# Patient Record
Sex: Female | Born: 1935 | Race: White | Hispanic: No | Marital: Single | Smoking: Never smoker
Health system: Southern US, Community
[De-identification: ages and names within clinical notes are randomized; demographics above are authoritative.]

## PROBLEM LIST (undated history)

## (undated) DIAGNOSIS — I10 Essential (primary) hypertension: Secondary | ICD-10-CM

## (undated) DIAGNOSIS — I5020 Unspecified systolic (congestive) heart failure: Secondary | ICD-10-CM

## (undated) DIAGNOSIS — M069 Rheumatoid arthritis, unspecified: Secondary | ICD-10-CM

## (undated) DIAGNOSIS — I2510 Atherosclerotic heart disease of native coronary artery without angina pectoris: Secondary | ICD-10-CM

## (undated) HISTORY — PX: TONSILLECTOMY: SUR1361

---

## 2019-07-16 ENCOUNTER — Emergency Department: Payer: Medicare Other

## 2019-07-16 ENCOUNTER — Other Ambulatory Visit: Payer: Self-pay

## 2019-07-16 ENCOUNTER — Inpatient Hospital Stay (HOSPITAL_COMMUNITY)
Admit: 2019-07-16 | Discharge: 2019-07-16 | Disposition: A | Discharge status: Home / Self Care | Payer: Medicare Other | Attending: Family Medicine | Admitting: Family Medicine

## 2019-07-16 ENCOUNTER — Inpatient Hospital Stay
Admission: EM | Admit: 2019-07-16 | Discharge: 2019-07-17 | DRG: 280 | Disposition: A | Discharge status: Other Acute Inpatient Hospital | Payer: Medicare Other | Attending: Internal Medicine | Admitting: Internal Medicine

## 2019-07-16 ENCOUNTER — Encounter: Payer: Self-pay | Admitting: Emergency Medicine

## 2019-07-16 DIAGNOSIS — I110 Hypertensive heart disease with heart failure: Secondary | ICD-10-CM | POA: Diagnosis present

## 2019-07-16 DIAGNOSIS — I161 Hypertensive emergency: Secondary | ICD-10-CM | POA: Diagnosis present

## 2019-07-16 DIAGNOSIS — J9600 Acute respiratory failure, unspecified whether with hypoxia or hypercapnia: Secondary | ICD-10-CM | POA: Diagnosis present

## 2019-07-16 DIAGNOSIS — I509 Heart failure, unspecified: Secondary | ICD-10-CM

## 2019-07-16 DIAGNOSIS — I361 Nonrheumatic tricuspid (valve) insufficiency: Secondary | ICD-10-CM | POA: Diagnosis not present

## 2019-07-16 DIAGNOSIS — D72829 Elevated white blood cell count, unspecified: Secondary | ICD-10-CM | POA: Diagnosis present

## 2019-07-16 DIAGNOSIS — Z8249 Family history of ischemic heart disease and other diseases of the circulatory system: Secondary | ICD-10-CM | POA: Diagnosis not present

## 2019-07-16 DIAGNOSIS — I2510 Atherosclerotic heart disease of native coronary artery without angina pectoris: Secondary | ICD-10-CM | POA: Diagnosis present

## 2019-07-16 DIAGNOSIS — Z20828 Contact with and (suspected) exposure to other viral communicable diseases: Secondary | ICD-10-CM | POA: Diagnosis present

## 2019-07-16 DIAGNOSIS — I493 Ventricular premature depolarization: Secondary | ICD-10-CM | POA: Diagnosis present

## 2019-07-16 DIAGNOSIS — I5043 Acute on chronic combined systolic (congestive) and diastolic (congestive) heart failure: Secondary | ICD-10-CM | POA: Diagnosis present

## 2019-07-16 DIAGNOSIS — J9601 Acute respiratory failure with hypoxia: Secondary | ICD-10-CM | POA: Diagnosis present

## 2019-07-16 DIAGNOSIS — I340 Nonrheumatic mitral (valve) insufficiency: Secondary | ICD-10-CM

## 2019-07-16 DIAGNOSIS — M1990 Unspecified osteoarthritis, unspecified site: Secondary | ICD-10-CM | POA: Diagnosis present

## 2019-07-16 DIAGNOSIS — Z833 Family history of diabetes mellitus: Secondary | ICD-10-CM | POA: Diagnosis not present

## 2019-07-16 DIAGNOSIS — D649 Anemia, unspecified: Secondary | ICD-10-CM | POA: Diagnosis present

## 2019-07-16 DIAGNOSIS — E1165 Type 2 diabetes mellitus with hyperglycemia: Secondary | ICD-10-CM | POA: Diagnosis present

## 2019-07-16 DIAGNOSIS — R0603 Acute respiratory distress: Secondary | ICD-10-CM | POA: Diagnosis present

## 2019-07-16 DIAGNOSIS — I214 Non-ST elevation (NSTEMI) myocardial infarction: Secondary | ICD-10-CM

## 2019-07-16 DIAGNOSIS — R778 Other specified abnormalities of plasma proteins: Secondary | ICD-10-CM

## 2019-07-16 DIAGNOSIS — M069 Rheumatoid arthritis, unspecified: Secondary | ICD-10-CM | POA: Diagnosis present

## 2019-07-16 DIAGNOSIS — I5021 Acute systolic (congestive) heart failure: Secondary | ICD-10-CM | POA: Diagnosis not present

## 2019-07-16 HISTORY — DX: Rheumatoid arthritis, unspecified: M06.9

## 2019-07-16 LAB — CBC WITH DIFFERENTIAL/PLATELET
Abs Immature Granulocytes: 0.07 10*3/uL (ref 0.00–0.07)
Abs Immature Granulocytes: 0.06 10*3/uL (ref 0.00–0.07)
Basophils Absolute: 0.1 10*3/uL (ref 0.0–0.1)
Basophils Absolute: 0 10*3/uL (ref 0.0–0.1)
Basophils Relative: 1 %
Basophils Relative: 0 %
Eosinophils Absolute: 0.1 10*3/uL (ref 0.0–0.5)
Eosinophils Absolute: 0 10*3/uL (ref 0.0–0.5)
Eosinophils Relative: 1 %
Eosinophils Relative: 0 %
HCT: 39.5 % (ref 36.0–46.0)
HCT: 37.1 % (ref 36.0–46.0)
Hemoglobin: 11.7 g/dL — ABNORMAL LOW (ref 12.0–15.0)
Hemoglobin: 11.4 g/dL — ABNORMAL LOW (ref 12.0–15.0)
Immature Granulocytes: 1 %
Immature Granulocytes: 1 %
Lymphocytes Relative: 32 %
Lymphocytes Relative: 4 %
Lymphs Abs: 4.9 10*3/uL — ABNORMAL HIGH (ref 0.7–4.0)
Lymphs Abs: 0.5 10*3/uL — ABNORMAL LOW (ref 0.7–4.0)
MCH: 23.4 pg — ABNORMAL LOW (ref 26.0–34.0)
MCH: 23.1 pg — ABNORMAL LOW (ref 26.0–34.0)
MCHC: 29.6 g/dL — ABNORMAL LOW (ref 30.0–36.0)
MCHC: 30.7 g/dL (ref 30.0–36.0)
MCV: 79.2 fL — ABNORMAL LOW (ref 80.0–100.0)
MCV: 75.3 fL — ABNORMAL LOW (ref 80.0–100.0)
Monocytes Absolute: 0.9 10*3/uL (ref 0.1–1.0)
Monocytes Absolute: 0.3 10*3/uL (ref 0.1–1.0)
Monocytes Relative: 6 %
Monocytes Relative: 3 %
Neutro Abs: 9.1 10*3/uL — ABNORMAL HIGH (ref 1.7–7.7)
Neutro Abs: 11.1 10*3/uL — ABNORMAL HIGH (ref 1.7–7.7)
Neutrophils Relative %: 59 %
Neutrophils Relative %: 92 %
Platelets: 296 10*3/uL (ref 150–400)
Platelets: 279 10*3/uL (ref 150–400)
RBC: 4.99 MIL/uL (ref 3.87–5.11)
RBC: 4.93 MIL/uL (ref 3.87–5.11)
RDW: 16 % — ABNORMAL HIGH (ref 11.5–15.5)
RDW: 15.8 % — ABNORMAL HIGH (ref 11.5–15.5)
WBC: 15.2 10*3/uL — ABNORMAL HIGH (ref 4.0–10.5)
WBC: 12 10*3/uL — ABNORMAL HIGH (ref 4.0–10.5)
nRBC: 0 % (ref 0.0–0.2)
nRBC: 0 % (ref 0.0–0.2)

## 2019-07-16 LAB — GLUCOSE, CAPILLARY
Glucose-Capillary: 109 mg/dL — ABNORMAL HIGH (ref 70–99)
Glucose-Capillary: 139 mg/dL — ABNORMAL HIGH (ref 70–99)
Glucose-Capillary: 131 mg/dL — ABNORMAL HIGH (ref 70–99)
Glucose-Capillary: 126 mg/dL — ABNORMAL HIGH (ref 70–99)
Glucose-Capillary: 99 mg/dL (ref 70–99)

## 2019-07-16 LAB — COMPREHENSIVE METABOLIC PANEL
ALT: 24 U/L (ref 0–44)
AST: 34 U/L (ref 15–41)
Albumin: 3.3 g/dL — ABNORMAL LOW (ref 3.5–5.0)
Alkaline Phosphatase: 95 U/L (ref 38–126)
Anion gap: 13 (ref 5–15)
BUN: 16 mg/dL (ref 8–23)
CO2: 19 mmol/L — ABNORMAL LOW (ref 22–32)
Calcium: 9.6 mg/dL (ref 8.9–10.3)
Chloride: 104 mmol/L (ref 98–111)
Creatinine, Ser: 0.86 mg/dL (ref 0.44–1.00)
GFR calc Af Amer: >60 mL/min (ref >60)
GFR calc non Af Amer: >60 mL/min (ref >60)
Glucose, Bld: 314 mg/dL — ABNORMAL HIGH (ref 70–99)
Potassium: 4.1 mmol/L (ref 3.5–5.1)
Sodium: 136 mmol/L (ref 135–145)
Total Bilirubin: 0.8 mg/dL (ref 0.3–1.2)
Total Protein: 6.4 g/dL — ABNORMAL LOW (ref 6.5–8.1)

## 2019-07-16 LAB — SARS CORONAVIRUS 2 BY RT PCR (HOSPITAL ORDER, PERFORMED IN ~~LOC~~ HOSPITAL LAB): SARS Coronavirus 2: NEGATIVE

## 2019-07-16 LAB — URINALYSIS, ROUTINE W REFLEX MICROSCOPIC
Bilirubin Urine: NEGATIVE
Glucose, UA: 50 mg/dL — AB
Hgb urine dipstick: NEGATIVE
Ketones, ur: 5 mg/dL — AB
Leukocytes,Ua: NEGATIVE
Nitrite: NEGATIVE
Protein, ur: 100 mg/dL — AB
Specific Gravity, Urine: 1.018 (ref 1.005–1.030)
pH: 5 (ref 5.0–8.0)

## 2019-07-16 LAB — TROPONIN I (HIGH SENSITIVITY)
Troponin I (High Sensitivity): 92 ng/L — ABNORMAL HIGH (ref <18)
Troponin I (High Sensitivity): 193 ng/L (ref <18)
Troponin I (High Sensitivity): 599 ng/L (ref <18)

## 2019-07-16 LAB — APTT: aPTT: <24 seconds — ABNORMAL LOW (ref 24–36)

## 2019-07-16 LAB — ECHOCARDIOGRAM COMPLETE
Height: 65 in
Weight: 2491.2 oz

## 2019-07-16 LAB — MAGNESIUM: Magnesium: 2.4 mg/dL (ref 1.7–2.4)

## 2019-07-16 LAB — HEPARIN LEVEL (UNFRACTIONATED)
Heparin Unfractionated: 0.17 IU/mL — ABNORMAL LOW (ref 0.30–0.70)
Heparin Unfractionated: 0.32 IU/mL (ref 0.30–0.70)

## 2019-07-16 LAB — PROTIME-INR
INR: 1.2 (ref 0.8–1.2)
Prothrombin Time: 15.2 seconds (ref 11.4–15.2)

## 2019-07-16 LAB — MRSA PCR SCREENING: MRSA by PCR: NEGATIVE

## 2019-07-16 LAB — BRAIN NATRIURETIC PEPTIDE: B Natriuretic Peptide: 2027 pg/mL — ABNORMAL HIGH (ref 0.0–100.0)

## 2019-07-16 LAB — PROCALCITONIN: Procalcitonin: <0.1 ng/mL

## 2019-07-16 MED ORDER — SODIUM CHLORIDE 0.9% FLUSH: 3.0000 mL | INTRAVENOUS | Status: DC | PRN

## 2019-07-16 MED ORDER — NITROGLYCERIN 0.4 MG SL SUBL: 0.4000 mg | SUBLINGUAL_TABLET | SUBLINGUAL | Status: DC | PRN

## 2019-07-16 MED ORDER — NITROGLYCERIN 2 % TD OINT
1.0000 [in_us] | TOPICAL_OINTMENT | Freq: Four times a day (QID) | TRANSDERMAL | Status: DC
  Administered 2019-07-16 – 2019-07-17 (×4): 1 [in_us] via TOPICAL
  Filled 2019-07-16 (×4): qty 1

## 2019-07-16 MED ORDER — CHLORHEXIDINE GLUCONATE CLOTH 2 % EX PADS: 6.0000 | MEDICATED_PAD | Freq: Every day | CUTANEOUS | Status: DC

## 2019-07-16 MED ORDER — INSULIN REGULAR HUMAN 100 UNIT/ML IJ SOLN
10.0000 [IU] | Freq: Once | INTRAMUSCULAR | Status: AC
  Administered 2019-07-16: 10 [IU] via INTRAVENOUS
  Filled 2019-07-16: qty 10

## 2019-07-16 MED ORDER — HEPARIN BOLUS VIA INFUSION
2000.0000 [IU] | Freq: Once | INTRAVENOUS | Status: AC
  Administered 2019-07-16: 2000 [IU] via INTRAVENOUS
  Filled 2019-07-16: qty 2000

## 2019-07-16 MED ORDER — FAMOTIDINE 20 MG PO TABS: 20.0000 mg | ORAL_TABLET | Freq: Every day | ORAL | Status: DC

## 2019-07-16 MED ORDER — FUROSEMIDE 10 MG/ML IJ SOLN
20.0000 mg | Freq: Once | INTRAMUSCULAR | Status: AC
  Administered 2019-07-16: 20 mg via INTRAVENOUS
  Filled 2019-07-16: qty 4

## 2019-07-16 MED ORDER — HEPARIN SODIUM (PORCINE) 5000 UNIT/ML IJ SOLN: 60.0000 [IU]/kg | Freq: Once | INTRAMUSCULAR | Status: DC

## 2019-07-16 MED ORDER — ZOLPIDEM TARTRATE 5 MG PO TABS
5.0000 mg | ORAL_TABLET | Freq: Every evening | ORAL | Status: DC | PRN
  Filled 2019-07-16: qty 1

## 2019-07-16 MED ORDER — ASPIRIN EC 325 MG PO TBEC
325.0000 mg | DELAYED_RELEASE_TABLET | Freq: Every day | ORAL | Status: DC
  Administered 2019-07-16: 09:00:00 325 mg via ORAL
  Filled 2019-07-16: qty 1

## 2019-07-16 MED ORDER — ORAL CARE MOUTH RINSE
15.0000 mL | Freq: Two times a day (BID) | OROMUCOSAL | Status: DC
  Administered 2019-07-16: 23:00:00 15 mL via OROMUCOSAL

## 2019-07-16 MED ORDER — ATORVASTATIN CALCIUM 20 MG PO TABS
40.0000 mg | ORAL_TABLET | Freq: Every day | ORAL | Status: DC
  Administered 2019-07-16: 17:00:00 40 mg via ORAL
  Filled 2019-07-16: qty 2

## 2019-07-16 MED ORDER — HEPARIN BOLUS VIA INFUSION
4000.0000 [IU] | Freq: Once | INTRAVENOUS | Status: AC
  Administered 2019-07-16: 4000 [IU] via INTRAVENOUS
  Filled 2019-07-16: qty 4000

## 2019-07-16 MED ORDER — ASPIRIN 81 MG PO CHEW
324.0000 mg | CHEWABLE_TABLET | Freq: Once | ORAL | Status: AC
  Administered 2019-07-16: 324 mg via ORAL
  Filled 2019-07-16: qty 4

## 2019-07-16 MED ORDER — ASPIRIN EC 81 MG PO TBEC: 81.0000 mg | DELAYED_RELEASE_TABLET | Freq: Every day | ORAL | Status: DC

## 2019-07-16 MED ORDER — HYDRALAZINE HCL 20 MG/ML IJ SOLN: 10.0000 mg | Freq: Four times a day (QID) | INTRAMUSCULAR | Status: DC | PRN

## 2019-07-16 MED ORDER — HEPARIN (PORCINE) 25000 UT/250ML-% IV SOLN: 10.0000 [IU]/kg/h | INTRAVENOUS | Status: DC

## 2019-07-16 MED ORDER — LISINOPRIL 5 MG PO TABS
5.0000 mg | ORAL_TABLET | Freq: Every day | ORAL | Status: DC
  Administered 2019-07-16: 5 mg via ORAL
  Filled 2019-07-16: qty 1

## 2019-07-16 MED ORDER — MORPHINE SULFATE (PF) 2 MG/ML IV SOLN: 2.0000 mg | INTRAVENOUS | Status: DC | PRN

## 2019-07-16 MED ORDER — LABETALOL HCL 5 MG/ML IV SOLN: 20.0000 mg | INTRAVENOUS | Status: DC | PRN

## 2019-07-16 MED ORDER — FAMOTIDINE 20 MG PO TABS
20.0000 mg | ORAL_TABLET | Freq: Two times a day (BID) | ORAL | Status: DC
  Administered 2019-07-16: 20 mg via ORAL
  Filled 2019-07-16: qty 1

## 2019-07-16 MED ORDER — SODIUM CHLORIDE 0.9 % IV SOLN
INTRAVENOUS | Status: DC
  Administered 2019-07-17: 08:00:00 via INTRAVENOUS

## 2019-07-16 MED ORDER — SODIUM CHLORIDE 0.9 % IV SOLN: 250.0000 mL | INTRAVENOUS | Status: DC | PRN

## 2019-07-16 MED ORDER — ACETAMINOPHEN 325 MG PO TABS: 650.0000 mg | ORAL_TABLET | ORAL | Status: DC | PRN

## 2019-07-16 MED ORDER — ONDANSETRON HCL 4 MG/2ML IJ SOLN: 4.0000 mg | Freq: Four times a day (QID) | INTRAMUSCULAR | Status: DC | PRN

## 2019-07-16 MED ORDER — FUROSEMIDE 10 MG/ML IJ SOLN
20.0000 mg | Freq: Two times a day (BID) | INTRAMUSCULAR | Status: DC
  Administered 2019-07-16 – 2019-07-17 (×3): 20 mg via INTRAVENOUS
  Filled 2019-07-16 (×3): qty 2

## 2019-07-16 MED ORDER — INSULIN ASPART 100 UNIT/ML ~~LOC~~ SOLN
0.0000 [IU] | Freq: Three times a day (TID) | SUBCUTANEOUS | Status: DC
  Administered 2019-07-16 (×3): 3 [IU] via SUBCUTANEOUS
  Filled 2019-07-16 (×3): qty 1

## 2019-07-16 MED ORDER — HEPARIN (PORCINE) 25000 UT/250ML-% IV SOLN
1100.0000 [IU]/h | INTRAVENOUS | Status: DC
  Administered 2019-07-16: 04:00:00 800 [IU]/h via INTRAVENOUS
  Administered 2019-07-16: 950 [IU]/h via INTRAVENOUS
  Filled 2019-07-16 (×2): qty 250

## 2019-07-16 NOTE — ED Notes (Signed)
Dr Karma Greaser in to speak with patient and family

## 2019-07-16 NOTE — H&P (Addendum)
[facility] [place] NAME: Lacey Coleman    MR#:  [id number]  DATE OF BIRTH:  January 28, 1931  DATE OF ADMISSION:  07/16/2019  PRIMARY CARE PHYSICIAN: Patient, No Pcp Per   REQUESTING/REFERRING PHYSICIAN: Hinda Kehr, MD CHIEF COMPLAINT:   Chief Complaint  Patient presents with  . Respiratory Distress    HISTORY OF PRESENT ILLNESS:  Lacey Coleman  is a 85 y.o. very pleasant Caucasian female with a known history of rheumatoid arthritis who presented to the emergency room with acute onset of dyspnea which has been going on over the last 3 months and significantly worsened tonight.  She denied any cough or wheezing however has been having orthopnea and paroxysmal nocturnal dyspnea as well as worsening lower extremity edema.  She admits to midsternal and left parasternal chest pain felt as pressure and tightness and graded 10/10 in severity with no radiation.  She denied any palpitations.  No dysuria, oliguria or hematuria or flank pain.  No bleeding diathesis.  The patient has not seen a physician in 50 years.  Upon presentation to the emergency room, blood pressure was 165/91 with a pulse of 131 respiratory to 25 and pulse oximetry of 70% on room air.  Labs revealed a CO2 of 19 and glucose of 314, anion gap of 13 and magnesium of 2.4.  Her BNP was significantly elevated at 2027 and troponin I was elevated at 92.  CBC showed leukocytosis and mild anemia.  COVID-19 test came back negative.  Her INR is 1.2 and UA was unremarkable.  Portable chest ray showed small bilateral pleural effusions with bibasal atelectasis.  EKG showed sinus tachycardia with a rate of 133 initially before she was placed on BiPAP and later is come down to 110.  She was given 4 of aspirin as well as IV heparin bolus and drip.  She will be admitted to stepdown unit for further evaluation and management.  PAST MEDICAL HISTORY:   Past Medical History:  Diagnosis Date  . Rheumatoid  arthritis ([place])   Osteoarthritis  PAST SURGICAL HISTORY:  Tonsillectomy  SOCIAL HISTORY:   Social History   Tobacco Use  . Smoking status: Never Smoker  . Smokeless tobacco: Never Used  Substance Use Topics  . Alcohol use: Not Currently    Frequency: Never    FAMILY HISTORY:  Positive for MI in both her parents and diabetes mellitus in her aunt.  DRUG ALLERGIES:  No Known Allergies  REVIEW OF SYSTEMS:   ROS As per history of present illness. All pertinent systems were reviewed above. Constitutional,  HEENT, cardiovascular, respiratory, GI, GU, musculoskeletal, neuro, psychiatric, endocrine,  integumentary and hematologic systems were reviewed and are otherwise  negative/unremarkable except for positive findings mentioned above in the HPI.   MEDICATIONS AT HOME:   Prior to Admission medications   Not on File      VITAL SIGNS:  Blood pressure 136/76, pulse (!) 116, temperature 98 F (36.7 C), temperature source Rectal, resp. rate (!) 29, height 5\' 5"  (1.651 m), weight 70.6 kg, SpO2 99 %.  PHYSICAL EXAMINATION:  Physical Exam  GENERAL:  85 y.o.-year-old pleasant Caucasian female patient lying in the bed with mild respiratory distress on BiPAP. EYES: Pupils equal, round, reactive to light and accommodation. No scleral icterus. Extraocular muscles intact.  HEENT: Head atraumatic, normocephalic. Oropharynx clear. NECK:  Supple, no jugular venous distention. No thyroid enlargement, no tenderness.  LUNGS: Diminished bibasilar breath sounds with bibasal rales. CARDIOVASCULAR: Regular  rate and rhythm, S1, S2 normal. No murmurs, rubs, or gallops.  ABDOMEN: Soft, nondistended, nontender. Bowel sounds present. No organomegaly or mass.  EXTREMITIES: 2-3+ bilateral lower extremity pitting edema with no clubbing or cyanosis.  NEUROLOGIC: Cranial nerves II through XII are intact. Muscle strength 5/5 in all extremities. Sensation intact. Gait not checked.  PSYCHIATRIC: The  patient is alert and oriented x 3.  Normal affect and good eye contact. SKIN: No obvious rash, lesion, or ulcer.   LABORATORY PANEL:   CBC Recent Labs  Lab 07/16/19 0141  WBC 15.2*  HGB 11.7*  HCT 39.5  PLT 296   ------------------------------------------------------------------------------------------------------------------  Chemistries  Recent Labs  Lab 07/16/19 0141  NA 136  K 4.1  CL 104  CO2 19*  GLUCOSE 314*  BUN 16  CREATININE 0.86  CALCIUM 9.6  MG 2.4  AST 34  ALT 24  ALKPHOS 95  BILITOT 0.8   ------------------------------------------------------------------------------------------------------------------  Cardiac Enzymes No results for input(s): TROPONINI in the last 168 hours. ------------------------------------------------------------------------------------------------------------------  RADIOLOGY:  Dg Chest Portable 1 View  Result Date: 07/16/2019 CLINICAL DATA:  Shortness of breath EXAM: PORTABLE CHEST 1 VIEW COMPARISON:  None. FINDINGS: Small pleural effusions with bibasilar atelectasis. No other consolidation. No pulmonary edema or pneumothorax. Cardiomediastinal contours are normal. IMPRESSION: Small bilateral pleural effusions with bibasilar atelectasis. Electronically Signed   By: Deatra RobinsonKevin  Herman M.D.   On: 07/16/2019 02:13      IMPRESSION AND PLAN:   1.  Acute new onset CHF likely diastolic in nature with subsequent acute respiratory failure. -The patient will be admitted to a stepdown bed and will be diuresed with IV Lasix. - Will follow serial cardiac enzymes.   -Will obtain a 2D echo and a cardiology consultation in a.m. -We will continue her on BiPAP and taper off as tolerated to nasal cannula. -We will follow her ABG. - I notified Dr. Johney FrameAllred with [facility] about the consult. - I also discussed the case with the e- link intensivist on duty.  2.  Suspected acute coronary syndrome, possibly non-ST elevation MI.  She has chest pain with  elevated troponin I of 92.  The troponin elevation could be related to her acute CHF though. -Will follow more troponin levels. -Will obtain a 2D echo and a cardiology consult as mentioned above. -She will be placed on aspirin as well as Nitropaste. -Statin therapy will be added and fasting lipids will be checked. -We will hold off on starting beta-blocker until she is more euvolemic. -We will continue IV heparin drip. -She will be placed on PRN sublingual nitroglycerin and morphine sulfate for pain as well as scheduled Nitropaste.  3.  Hypertensive urgency.  This is likely the culprit for her acute CHF. - She will be placed on PRN IV labetalol and hydralazine. - We will start her on p.o. lisinopril. -She will be on scheduled Nitropaste  4.  New onset uncontrolled diabetes mellitus likely type II. -She will be placed on supplemental coverage with NovoLog.  We will give 10 units of IV regular insulin. -Will obtain hemoglobin A1c level.  5.  Osteoarthritis.  She has no current arthralgia.  6.  DVT prophylaxis.  She will be on IV heparin drip.  GI prophylaxis will be provided with p.o. Pepcid given current anticoagulation.  All the records are reviewed and case discussed with ED provider. The plan of care was discussed in details with the patient (and family). I answered all questions. The patient agreed to proceed with the above mentioned  plan. Further management will depend upon hospital course.   CODE STATUS: Full code  TOTAL CRITICAL CARE TIME TAKING CARE OF THIS PATIENT: 60 minutes.    Hannah Beat M.D on 07/16/2019 at 3:22 AM  Pager - [phone]  After 6pm go to [email or web address] - Social research officer, government  [facility] [place] [facility]  Office  [phone]  CC: Primary care physician; Patient, No Pcp Per   Note: This dictation was prepared with Dragon dictation along with smaller phrase technology. Any transcriptional errors that result from this process are  unintentional.

## 2019-07-16 NOTE — Consult Note (Signed)
CARDIOLOGY CONSULT NOTE     Primary Care Physician: Patient, No Pcp Per Referring Physician:  Dr Luberta Mutter  Admit Date: 07/16/2019  Reason for consultation:  New CHF  Lacey Coleman is a 85 y.o. female with a h/o RA who is admitted with worsening SOB x 3 weeks.  The patient has noticed progressive orthopnea and PND.   + Edema.   She also has occasional chest pain.  She required BiPAP in the ED and is admitted for further management.  Echo has revealed markedly depressed EF. With aggressive diuresis overnight, she feels "much better" today.  Today, she denies symptoms of palpitations, chest pain,   dizziness, presyncope, syncope, or neurologic sequela. The patient is tolerating medications without difficulties and is otherwise without complaint today.   Past Medical History:  Diagnosis Date  . Rheumatoid arthritis ([facility])    History reviewed. No pertinent surgical history.  Marland Kitchen aspirin EC  325 mg Oral Daily  . atorvastatin  40 mg Oral q1800  . Chlorhexidine Gluconate Cloth  6 each Topical Daily  . famotidine  20 mg Oral BID  . furosemide  20 mg Intravenous Q12H  . heparin  2,000 Units Intravenous Once  . insulin aspart  0-20 Units Subcutaneous TID WC  . lisinopril  5 mg Oral Daily  . mouth rinse  15 mL Mouth Rinse BID  . nitroGLYCERIN  1 inch Topical Q6H   . sodium chloride    . heparin 800 Units/hr (07/16/19 1000)    No Known Allergies  Social History   Socioeconomic History  . Marital status: Single    Spouse name: Not on file  . Number of children: Not on file  . Years of education: Not on file  . Highest education level: Not on file  Occupational History  . Not on file  Social Needs  . Financial resource strain: Not on file  . Food insecurity    Worry: Not on file    Inability: Not on file  . Transportation needs    Medical: Not on file    Non-medical: Not on file  Tobacco Use  . Smoking status: Never Smoker  . Smokeless tobacco: Never Used  Substance and  Sexual Activity  . Alcohol use: Not Currently    Frequency: Never  . Drug use: Not Currently  . Sexual activity: Not on file  Lifestyle  . Physical activity    Days per week: Not on file    Minutes per session: Not on file  . Stress: Not on file  Relationships  . Social Musician on phone: Not on file    Gets together: Not on file    Attends religious service: Not on file    Active member of club or organization: Not on file    Attends meetings of clubs or organizations: Not on file    Relationship status: Not on file  . Intimate partner violence    Fear of current or ex partner: Not on file    Emotionally abused: Not on file    Physically abused: Not on file    Forced sexual activity: Not on file  Other Topics Concern  . Not on file  Social History Narrative  . Not on file    History reviewed. No pertinent family history.  ROS- All systems are reviewed and negative except as per the HPI above  Physical Exam: Telemetry: sinus Vitals:   07/16/19 0900 07/16/19 1000 07/16/19 1100 07/16/19 1200  BP: Marland Kitchen)  142/84 119/74 132/79 113/65  Pulse: (!) 113 98 (!) 107 (!) 107  Resp: (!) 21 (!) 23 20 (!) 26  Temp:    98.5 F (36.9 C)  TempSrc:    Oral  SpO2: 98% 97% 96% 96%  Weight:      Height:        GEN- The patient is elderly and frail appearing, alert and oriented x 3 today.   Head- normocephalic, atraumatic Eyes-  Sclera clear, conjunctiva pink Ears- hearing intact Oropharynx- clear Neck- supple, + JVD Lungs- bibasilar rales , normal work of breathing Heart- Regular rate and rhythm  GI- soft, NT, ND, + BS Extremities- no clubbing, cyanosis, + edema MS- diffuse atrophy Skin- no rash or lesion Psych- euthymic mood, full affect Neuro- strength and sensation are intact  EKG: pending  Labs:   Lab Results  Component Value Date   WBC 12.0 (H) 07/16/2019   HGB 11.4 (L) 07/16/2019   HCT 37.1 07/16/2019   MCV 75.3 (L) 07/16/2019   PLT 279 07/16/2019     Recent Labs  Lab 07/16/19 0141  NA 136  K 4.1  CL 104  CO2 19*  BUN 16  CREATININE 0.86  CALCIUM 9.6  PROT 6.4*  BILITOT 0.8  ALKPHOS 95  ALT 24  AST 34  GLUCOSE 314*    Radiology: CXR reveals small bilateral effusions  Echo:  EF 28-41%, diastolic dysfunction, mild AI  ASSESSMENT AND PLAN:   1. Acute systolic dysfunction Unclear etiology Clinically improved with diuresis Continue IV lasix another 24-48 hours Add beta blocker as her CHF improves I would advise left heart cath to further evaluate the cause of her CHF.  Risks of cath discussed with patient who wishes to proceed.  2. Hypertensive cardiovascular disease with CHF Aggressive medical management is advised Continue current therapy Add coreg tomorrow and continue to titrate medicines as BP allows   Thompson Grayer, MD 07/16/2019  1:20 PM

## 2019-07-16 NOTE — ED Notes (Signed)
.. ED TO INPATIENT HANDOFF REPORT  ED Nurse Name and Phone #: Jacki ConesLaurie [phone]  S Name/Age/Gender Lacey Coleman 85 y.o. female Room/Bed: ED06A/ED06A  Code Status   Code Status: Full Code  Home/SNF/Other Home Patient oriented to: self, place, time and situation Is this baseline? Yes   Triage Complete: Triage complete  Chief Complaint breathing Diff  Triage Note Ptpresents via EMS in respiratory distress. EMS reported sudden onset, PMH negligible. Pt pale, tachycardiac, 3+ bilateral pitting edema in legs. Pt w/ 3 hr hx of respiratory distress prior to EMS transport.   Allergies No Known Allergies  Level of Care/Admitting Diagnosis ED Disposition    ED Disposition Condition Comment   Admit  Hospital Area: [facility] [[id number]]  Level of Care: Stepdown [14]  Covid Evaluation: Confirmed COVID Negative  Diagnosis: Acute respiratory failure (HCC) [518.81.ICD-9-CM]  Admitting Physician: Hannah BeatMANSY, JAN A [[id number]]  Attending Physician: Hannah BeatMANSY, JAN A [[id number]]  Estimated length of stay: past midnight tomorrow  Certification:: I certify this patient will need inpatient services for at least 2 midnights  PT Class (Do Not Modify): Inpatient [101]  PT Acc Code (Do Not Modify): Private [1]       B Medical/Surgery History Past Medical History:  Diagnosis Date  . Rheumatoid arthritis (HCC)    History reviewed. No pertinent surgical history.   A IV Location/Drains/Wounds Patient Lines/Drains/Airways Status   Active Line/Drains/Airways    Name:   Placement date:   Placement time:   Site:   Days:   Peripheral IV 07/16/19 Anterior;Left;Proximal Forearm   07/16/19    0139    Forearm   less than 1   Peripheral IV 07/16/19 Left Hand   07/16/19    0211    Hand   less than 1          Intake/Output Last 24 hours No intake or output data in the 24 hours ending 07/16/19 0428  Labs/Imaging Results for orders placed or performed during the hospital encounter of  07/16/19 (from the past 48 hour(s))  SARS Coronavirus 2 [facility](Hospital order, Performed in [facility] hospital lab) Nasopharyngeal Nasopharyngeal Swab     Status: None   Collection Time: 07/16/19  1:40 AM   Specimen: Nasopharyngeal Swab  Result Value Ref Range   SARS Coronavirus 2 NEGATIVE NEGATIVE    Comment: (NOTE) If result is NEGATIVE SARS-CoV-2 target nucleic acids are NOT DETECTED. The SARS-CoV-2 RNA is generally detectable in upper and lower  respiratory specimens during the acute phase of infection. The lowest  concentration of SARS-CoV-2 viral copies this assay can detect is 250  copies / mL. A negative result does not preclude SARS-CoV-2 infection  and should not be used as the sole basis for treatment or other  patient management decisions.  A negative result may occur with  improper specimen collection / handling, submission of specimen other  than nasopharyngeal swab, presence of viral mutation(s) within the  areas targeted by this assay, and inadequate number of viral copies  (<250 copies / mL). A negative result must be combined with clinical  observations, patient history, and epidemiological information. If result is POSITIVE SARS-CoV-2 target nucleic acids are DETECTED. The SARS-CoV-2 RNA is generally detectable in upper and lower  respiratory specimens dur ing the acute phase of infection.  Positive  results are indicative of active infection with SARS-CoV-2.  Clinical  correlation with patient history and other diagnostic information is  necessary to determine patient infection status.  Positive results do  not rule out bacterial infection or co-infection with other viruses. If result is PRESUMPTIVE POSTIVE SARS-CoV-2 nucleic acids MAY BE PRESENT.   A presumptive positive result was obtained on the submitted specimen  and confirmed on repeat testing.  While 2019 novel coronavirus  (SARS-CoV-2) nucleic acids may be present in the submitted sample  additional  confirmatory testing may be necessary for epidemiological  and / or clinical management purposes  to differentiate between  SARS-CoV-2 and other Sarbecovirus currently known to infect humans.  If clinically indicated additional testing with an alternate test  methodology [phone] is advised. The SARS-CoV-2 RNA is generally  detectable in upper and lower respiratory sp ecimens during the acute  phase of infection. The expected result is Negative. Fact Sheet for Patients:  BoilerBrush.com.cyhttps://www.fda.gov/media/136312/download Fact Sheet for Healthcare Providers: https://pope.com/https://www.fda.gov/media/136313/download This test is not yet approved or cleared by the [place] FDA and has been authorized for detection and/or diagnosis of SARS-CoV-2 by FDA under an Emergency Use Authorization (EUA).  This EUA will remain in effect (meaning this test can be used) for the duration of the COVID-19 declaration under Section 564(b)(1) of the Act, 21 U.S.C. section 360bbb-3(b)(1), unless the authorization is terminated or revoked sooner. Performed at [facility], [address] [place], [place] [address]   Troponin I (High Sensitivity)     Status: Abnormal   Collection Time: 07/16/19  1:41 AM  Result Value Ref Range   Troponin I (High Sensitivity) 92 (H) <18 ng/L    Comment: (NOTE) Elevated high sensitivity troponin I (hsTnI) values and significant  changes across serial measurements may suggest ACS but many other  chronic and acute conditions are known to elevate hsTnI results.  Refer to the "Links" section for chest pain algorithms and additional  guidance. Performed at [facility], [address] [place], [place] [address]   Comprehensive metabolic panel     Status: Abnormal   Collection Time: 07/16/19  1:41 AM  Result Value Ref Range   Sodium 136 135 - 145 mmol/L   Potassium 4.1 3.5 - 5.1 mmol/L   Chloride 104 98 - 111 mmol/L   CO2 19 (L) 22 - 32 mmol/L   Glucose, Bld 314 (H) 70  - 99 mg/dL   BUN 16 8 - 23 mg/dL   Creatinine, Ser 4.780.86 0.44 - 1.00 mg/dL   Calcium 9.6 8.9 - 29.510.3 mg/dL   Total Protein 6.4 (L) 6.5 - 8.1 g/dL   Albumin 3.3 (L) 3.5 - 5.0 g/dL   AST 34 15 - 41 U/L   ALT 24 0 - 44 U/L   Alkaline Phosphatase 95 38 - 126 U/L   Total Bilirubin 0.8 0.3 - 1.2 mg/dL   GFR calc non Af Amer >60 >60 mL/min   GFR calc Af Amer >60 >60 mL/min   Anion gap 13 5 - 15    Comment: Performed at [facility], [address] [place], [place] [address]  Magnesium     Status: None   Collection Time: 07/16/19  1:41 AM  Result Value Ref Range   Magnesium 2.4 1.7 - 2.4 mg/dL    Comment: Performed at [facility], [address] [place], [place] [address]  Brain natriuretic peptide     Status: Abnormal   Collection Time: 07/16/19  1:41 AM  Result Value Ref Range   B Natriuretic Peptide 2,027.0 (H) 0.0 - 100.0 pg/mL    Comment: Performed at [facility], [address] [place], [place]  [address]  CBC with Differential/Platelet     Status: Abnormal   Collection Time: 07/16/19  1:41 AM  Result Value Ref Range   WBC 15.2 (H) 4.0 - 10.5 K/uL   RBC 4.99 3.87 - 5.11 MIL/uL   Hemoglobin 11.7 (L) 12.0 - 15.0 g/dL   HCT 60.4 54.0 - 98.1 %   MCV 79.2 (L) 80.0 - 100.0 fL   MCH 23.4 (L) 26.0 - 34.0 pg   MCHC 29.6 (L) 30.0 - 36.0 g/dL   RDW 19.1 (H) 47.8 - 29.5 %   Platelets 296 150 - 400 K/uL   nRBC 0.0 0.0 - 0.2 %   Neutrophils Relative % 59 %   Neutro Abs 9.1 (H) 1.7 - 7.7 K/uL   Lymphocytes Relative 32 %   Lymphs Abs 4.9 (H) 0.7 - 4.0 K/uL   Monocytes Relative 6 %   Monocytes Absolute 0.9 0.1 - 1.0 K/uL   Eosinophils Relative 1 %   Eosinophils Absolute 0.1 0.0 - 0.5 K/uL   Basophils Relative 1 %   Basophils Absolute 0.1 0.0 - 0.1 K/uL   Immature Granulocytes 1 %   Abs Immature Granulocytes 0.07 0.00 - 0.07 K/uL    Comment: Performed at [facility], [address] [place], [place] [address]  Protime-INR     Status: None    Collection Time: 07/16/19  1:41 AM  Result Value Ref Range   Prothrombin Time 15.2 11.4 - 15.2 seconds   INR 1.2 0.8 - 1.2    Comment: (NOTE) INR goal varies based on device and disease states. Performed at [facility], [address] [place], [place] [address]   APTT     Status: Abnormal   Collection Time: 07/16/19  1:41 AM  Result Value Ref Range   aPTT <24 (L) 24 - 36 seconds    Comment: Performed at [facility], [address] [place], [place] [address]  Procalcitonin     Status: None   Collection Time: 07/16/19  1:41 AM  Result Value Ref Range   Procalcitonin <0.10 ng/mL    Comment:        Interpretation: PCT (Procalcitonin) <= 0.5 ng/mL: Systemic infection (sepsis) is not likely. Local bacterial infection is possible. (NOTE)       Sepsis PCT Algorithm           Lower Respiratory Tract                                      Infection PCT Algorithm    ----------------------------     ----------------------------         PCT < 0.25 ng/mL                PCT < 0.10 ng/mL         Strongly encourage             Strongly discourage   discontinuation of antibiotics    initiation of antibiotics    ----------------------------     -----------------------------       PCT 0.25 - 0.50 ng/mL            PCT 0.10 - 0.25 ng/mL               OR       >80% decrease in PCT            Discourage initiation of  antibiotics      Encourage discontinuation           of antibiotics    ----------------------------     -----------------------------         PCT >= 0.50 ng/mL              PCT 0.26 - 0.50 ng/mL               AND        <80% decrease in PCT             Encourage initiation of                                             antibiotics       Encourage continuation           of antibiotics    ----------------------------     -----------------------------        PCT >= 0.50 ng/mL                  PCT > 0.50 ng/mL                AND         increase in PCT                  Strongly encourage                                      initiation of antibiotics    Strongly encourage escalation           of antibiotics                                     -----------------------------                                           PCT <= 0.25 ng/mL                                                 OR                                        > 80% decrease in PCT                                     Discontinue / Do not initiate                                             antibiotics Performed at [facility], [address] [place], [place] [address]   Urinalysis, Routine w reflex microscopic     Status: Abnormal   Collection  Time: 07/16/19  2:40 AM  Result Value Ref Range   Color, Urine YELLOW (A) YELLOW   APPearance HAZY (A) CLEAR   Specific Gravity, Urine 1.018 1.005 - 1.030   pH 5.0 5.0 - 8.0   Glucose, UA 50 (A) NEGATIVE mg/dL   Hgb urine dipstick NEGATIVE NEGATIVE   Bilirubin Urine NEGATIVE NEGATIVE   Ketones, ur 5 (A) NEGATIVE mg/dL   Protein, ur 100 (A) NEGATIVE mg/dL   Nitrite NEGATIVE NEGATIVE   Leukocytes,Ua NEGATIVE NEGATIVE   RBC / HPF 0-5 0 - 5 RBC/hpf   WBC, UA 0-5 0 - 5 WBC/hpf   Bacteria, UA FEW (A) NONE SEEN   Squamous Epithelial / LPF 0-5 0 - 5   Mucus PRESENT     Comment: Performed at [facility], [address] [place], [place] [address]   Dg Chest Portable 1 View  Result Date: 07/16/2019 CLINICAL DATA:  Shortness of breath EXAM: PORTABLE CHEST 1 VIEW COMPARISON:  None. FINDINGS: Small pleural effusions with bibasilar atelectasis. No other consolidation. No pulmonary edema or pneumothorax. Cardiomediastinal contours are normal. IMPRESSION: Small bilateral pleural effusions with bibasilar atelectasis. Electronically Signed   By: Ulyses Jarred M.D.   On: 07/16/2019 02:13    Pending Labs Unresulted Labs (From admission, onward)    Start     Ordered   07/17/19 2778  Basic  metabolic panel  Daily,   STAT     07/16/19 0317   07/16/19 1200  Heparin level (unfractionated)  Once-Timed,   STAT     07/16/19 0346   07/16/19 0500  CBC WITH DIFFERENTIAL  Daily,   STAT     07/16/19 0317   07/16/19 0333  Hemoglobin A1c  Once,   STAT    Comments: To assess prior glycemic control    07/16/19 0333   07/16/19 0221  Urine Culture  Add-on,   AD    Question:  Patient immune status  Answer:  Normal   07/16/19 0221          Vitals/Pain Today's Vitals   07/16/19 0324 07/16/19 0330 07/16/19 0345 07/16/19 0400  BP:  (!) 145/81 138/81 132/83  Pulse:  (!) 115 (!) 117 (!) 111  Resp:  (!) 29 (!) 29 (!) 27  Temp:      TempSrc:      SpO2:  99% 99% 97%  Weight:      Height:      PainSc: 0-No pain       Isolation Precautions No active isolations  Medications Medications  heparin bolus via infusion 4,000 Units (4,000 Units Intravenous Bolus from Bag 07/16/19 0343)    Followed by  heparin ADULT infusion 100 units/mL (25000 units/249mL sodium chloride 0.45%) (800 Units/hr Intravenous New Bag/Given 07/16/19 0346)  sodium chloride flush (NS) 0.9 % injection 3 mL (has no administration in time range)  0.9 %  sodium chloride infusion (has no administration in time range)  acetaminophen (TYLENOL) tablet 650 mg (has no administration in time range)  ondansetron (ZOFRAN) injection 4 mg (has no administration in time range)  furosemide (LASIX) injection 20 mg (has no administration in time range)  lisinopril (ZESTRIL) tablet 5 mg (has no administration in time range)  zolpidem (AMBIEN) tablet 5 mg (has no administration in time range)  labetalol (NORMODYNE) injection 20 mg (has no administration in time range)  hydrALAZINE (APRESOLINE) injection 10 mg (has no administration in time range)  famotidine (PEPCID) tablet 20 mg (has no administration in time range)  insulin aspart (novoLOG) injection 0-20 Units (has no administration in time range)  insulin regular (NOVOLIN R) 100  units/mL injection 10 Units (has no administration in time range)  nitroGLYCERIN (NITROGLYN) 2 % ointment 1 inch (has no administration in time range)  nitroGLYCERIN (NITROSTAT) SL tablet 0.4 mg (has no administration in time range)  morphine 2 MG/ML injection 2 mg (has no administration in time range)  aspirin EC tablet 325 mg (has no administration in time range)  aspirin chewable tablet 324 mg (324 mg Oral Given 07/16/19 0248)  furosemide (LASIX) injection 20 mg (20 mg Intravenous Given 07/16/19 0339)    Mobility walks Low fall risk   Focused Assessments Pulmonary Assessment Handoff:  Lung sounds: Bilateral Breath Sounds: Diminished L Breath Sounds: Diminished, Expiratory wheezes R Breath Sounds: Diminished, Expiratory wheezes O2 Device: Bi-PAP        R Recommendations: See Admitting Provider Note  Report given to:   Additional Notes:ditional Notes: 2 large bore IV's to left arm/hand, heparin drip at 37ml/hr, ST on monitor, alert and oriented x 3; feeling and looking much better after placed on Bipap; denies chest pain; purewick in place-lasix has been given

## 2019-07-16 NOTE — ED Notes (Signed)
Heparin started as prescribed; bolus infusing without difficulty; admitting MD at bedside

## 2019-07-16 NOTE — Consult Note (Addendum)
ANTICOAGULATION CONSULT NOTE - Initial Consult  Pharmacy Consult for Heparin infusion  Indication: chest pain/ACS/NSTEMI  No Known Allergies  Patient Measurements: Weight: 150 lb (68 kg)  Vital Signs: Temp: 94.6 F (34.8 C) (09/13 0212) Temp Source: Axillary (09/13 0212) BP: 153/90 (09/13 0210) Pulse Rate: 123 (09/13 0210)  Labs: Recent Labs    07/16/19 0141  HGB 11.7*  HCT 39.5  PLT 296  CREATININE 0.86  TROPONINIHS 92*    CrCl cannot be calculated (Unknown ideal weight.).   Medical History: Past Medical History:  Diagnosis Date  . Rheumatoid arthritis [facility])     Assessment: Pharmacy consulted for heparin infusion dosing and monitoring in 85 yo female for ACS/NSTEMI. According to RN patient's Wt:150 lbs, Ht: 65 inches.   Goal of Therapy:  Heparin level 0.3-0.7 units/ml Monitor platelets by anticoagulation protocol: Yes   Plan:  Baseline labs ordered Give 4000 units bolus x 1 Start heparin infusion at 800 units/hr Check anti-Xa level in 8 hours and daily while on heparin Continue to monitor H&H and platelets  Pernell Dupre, PharmD, BCPS Clinical Pharmacist 07/16/2019 2:28 AM

## 2019-07-16 NOTE — ED Notes (Signed)
Laurie RN, aware of bed assigned  

## 2019-07-16 NOTE — Consult Note (Signed)
ANTICOAGULATION CONSULT NOTE  Pharmacy Consult for Heparin infusion  Indication: chest pain/ACS/NSTEMI  No Known Allergies  Patient Measurements: Height: 5\' 5"  (165.1 cm) Weight: 155 lb 11.2 oz (70.6 kg) IBW/kg (Calculated) : 57  Vital Signs: Temp: 97.9 F (36.6 C) (09/13 1600) Temp Source: Oral (09/13 1600) BP: 100/83 (09/13 1800) Pulse Rate: 112 (09/13 1800)  Labs: Recent Labs    07/16/19 0141 07/16/19 0348 07/16/19 0617 07/16/19 1237  HGB 11.7*  --  11.4*  --   HCT 39.5  --  37.1  --   PLT 296  --  279  --   APTT <24*  --   --   --   LABPROT 15.2  --   --   --   INR 1.2  --   --   --   HEPARINUNFRC  --   --   --  0.17*  CREATININE 0.86  --   --   --   TROPONINIHS 92* 193* 599*  --     Estimated Creatinine Clearance: 46.3 mL/min (by C-G formula based on SCr of 0.86 mg/dL).   Medical History: Past Medical History:  Diagnosis Date  . Rheumatoid arthritis [facility])     Assessment: Pharmacy consulted for heparin drip management for 85 yo female admitted with new onset CHF, suspected ACS/NSTEMI, and hypertensive urgency. Patient not on anticoagulants as an outpatient. Heparin infusing at 800 units/hr.   9/13 1237 0.17 @ 800 units/hr 9/13 1909 0.32 @ 950 units/hr therapeutic x 1  Goal of Therapy:  Heparin level 0.3-0.7 units/ml Monitor platelets by anticoagulation protocol: Yes   Plan:  Continue current rate of 950 units/hr  Anti-Xa level in 8 hours per protocol.   CBC with am labs.   Pharmacy will continue to monitor and adjust per consult.   Lu Duffel, PharmD, BCPS Clinical Pharmacist 07/16/2019 7:51 PM

## 2019-07-16 NOTE — Progress Notes (Signed)
Admitted this morning for acute respiratory failure with hypoxia secondary to new onset congestive heart failure.  Patient told me that she did not see a doctor for a long time since delivery of her second child 15 years ago, has been having swelling in the legs for 3 months, now he is admitted for CHF with elevated BNP.  Was off for BiPAP early this morning, states that she is feeling better with Lasix that was given earlier.   Vitals reviewed, stable blood pressure, O2 sats. 1.  Acute respiratory failure with hypoxia secondary to new onset congestive heart failure unknown systolic or diastolic, echocardiogram is pending, cardiology consult requested and appreciated.  Patient is off the BiPAP.  Oxygen saturation 96% on 2 L.

## 2019-07-16 NOTE — ED Notes (Signed)
Pt cleaned well, clean linens in place as well as new purewick placed and attends in place; pt appreciative; daughter remains at bedside;

## 2019-07-16 NOTE — Progress Notes (Signed)
Assisted tele visit to patient with family member.  Lacey Esbenshade P, RN  

## 2019-07-16 NOTE — Progress Notes (Signed)
*  PRELIMINARY RESULTS* Echocardiogram 2D Echocardiogram has been performed.  Lacey Coleman 07/16/2019, 12:30 PM

## 2019-07-16 NOTE — Consult Note (Signed)
PULMONARY / CRITICAL CARE MEDICINE  Name: Lacey Coleman MRN: [id number] DOB: 1931/01/24    LOS: 0  Referring Provider:  Dr Arville Care Reason for Referral:  Acute hypoxic respiratory failure, Acute CHF exacerbation, hypertensive crisis and new onset type 2 diabetes diabetes   Brief patient description: 85 year old female with no significant medical history besides rheumatoid arthritis now presenting with new onset diabetes, new onset CHF with exacerbation and acute hypoxic respiratory failure.  HPI: This is an 85 year old female with no significant medical history except for rheumatoid arthritis, who has not seen a healthcare provider in close to 50 years, who presented to the ED with severe respiratory distress, lower extremity edema and hypoxemia.  When EMS arrived, her SPO2 was in the 70s with significant increase in work of breathing diaphoresis and cyanotic extremities.  She was placed on CPAP and given a dose of Solu-Medrol and transferred to the ED.  Upon arrival in the ED, she remained in moderate respiratory distress but her SPO2 was improved.  Her systolic blood pressure was in the 160s and diastolic in the high 90s.  She was tachycardic with heart rate in the 120s.  Her ED work-up was significant for hyperglycemia with a blood glucose level of 340 mg/dL, a proBNP level of greater than 2000, troponin of 92 that trended up to a peak of 599, WBC of 15.2, pulmonary edema with bilateral pleural effusions on chest x-ray.  She is being admitted to the ICU for further management  Past Medical History:  Diagnosis Date  . Rheumatoid arthritis (HCC)    History reviewed. No pertinent surgical history. No current facility-administered medications on file prior to encounter.    No current outpatient medications on file prior to encounter.    Allergies No Known Allergies  Family History History reviewed. No pertinent family history. Social History  reports that she has never smoked. She has never  used smokeless tobacco. She reports previous alcohol use. She reports previous drug use.  Review Of Systems: Unable to obtain as patient is on continuous BiPAP  VITAL SIGNS: BP 127/86 (BP Location: Right Arm)   Pulse (!) 106   Temp 98 F (36.7 C) (Rectal)   Resp (!) 25   Ht 5\' 5"  (1.651 m)   Wt 70.6 kg   SpO2 98%   BMI 25.91 kg/m   HEMODYNAMICS:    VENTILATOR SETTINGS:    INTAKE / OUTPUT: No intake/output data recorded.  PHYSICAL EXAMINATION: General: Well-nourished, well-developed, in moderate respiratory distress HEENT: PERRLA, trachea midline, moderate JVD Neuro: Alert and oriented x3, no focal deficits Cardiovascular: Apical pulse regular, mildly tachycardic, S1-S2, no murmur regurg or gallop, +2 pulses bilaterally, +2 pitting edema Lungs: Increased work of breathing, bilateral breath sounds with bibasilar crackles, no wheezing Abdomen: Nondistended, normal bowel sounds in all 4 quadrants, palpation reveals no organomegaly Musculoskeletal: Positive range of motion, no joint deformities Skin: Warm and dry  LABS:  BMET Recent Labs  Lab 07/16/19 0141  NA 136  K 4.1  CL 104  CO2 19*  BUN 16  CREATININE 0.86  GLUCOSE 314*    Electrolytes Recent Labs  Lab 07/16/19 0141  CALCIUM 9.6  MG 2.4    CBC Recent Labs  Lab 07/16/19 0141 07/16/19 0617  WBC 15.2* 12.0*  HGB 11.7* 11.4*  HCT 39.5 37.1  PLT 296 279    Coag's Recent Labs  Lab 07/16/19 0141  APTT <24*  INR 1.2    Sepsis Markers Recent Labs  Lab 07/16/19 0141  PROCALCITON <0.10    ABG No results for input(s): PHART, PCO2ART, PO2ART in the last 168 hours.  Liver Enzymes Recent Labs  Lab 07/16/19 0141  AST 34  ALT 24  ALKPHOS 95  BILITOT 0.8  ALBUMIN 3.3*    Cardiac Enzymes No results for input(s): TROPONINI, PROBNP in the last 168 hours.  Glucose No results for input(s): GLUCAP in the last 168 hours.  Imaging Dg Chest Portable 1 View  Result Date:  07/16/2019 CLINICAL DATA:  Shortness of breath EXAM: PORTABLE CHEST 1 VIEW COMPARISON:  None. FINDINGS: Small pleural effusions with bibasilar atelectasis. No other consolidation. No pulmonary edema or pneumothorax. Cardiomediastinal contours are normal. IMPRESSION: Small bilateral pleural effusions with bibasilar atelectasis. Electronically Signed   By: Ulyses Jarred M.D.   On: 07/16/2019 02:13     STUDIES:  2D echo pain  CULTURES: Urine culture  ANTIBIOTICS: None  SIGNIFICANT EVENTS: 07/16/2019: Admitted  LINES/TUBES: Peripheral IVs   DISCUSSION: 85 year old female presenting with new onset CHF, new onset type 2 diabetes, hypertensive emergency and acute hypoxic respiratory failure  ASSESSMENT  Acute hypoxic respiratory failure requiring BiPAP New onset CHF New onset type 2 diabetes Hypertensive emergency Bilateral pleural effusions Acute pulmonary edema Elevated troponin -demand ischemia versus non-ST elevation MI Bilateral lower extremity edema History of rheumatoid arthritis  PLAN Hemodynamic monitoring per ICU protocol Continues BiPAP and titrate to nasal cannula as tolerated IV diuresis with furosemide Aspirin, statin and heparin infusion per ACS protocol Hydralazine, lisinopril nitroglycerin for blood pressure control Blood glucose monitoring with sliding scale insulin Hemoglobin A1c and C-peptide level in the morning Cardiology consult and 2D echo pending Trend troponins and EKGs Morphine PRN for severe dyspnea Strict I's and O's   Best Practice: Code Status: Full code Diet: Heart healthy diet once off BiPAP GI prophylaxis: Pepcid VTE prophylaxis: SCDs and already on full-strength heparin  FAMILY  - Updates: Patient's family updated on current treatment plan by ED and hospitalist.  Will update with any changes in treatment plan  Magdalene S. Tukov-Yual ANP-BC Pulmonary and [place] Pager [phone] or  [phone]  NB: This document was prepared using Dragon voice recognition software and may include unintentional dictation errors.    07/16/2019, 7:30 AM

## 2019-07-16 NOTE — ED Provider Notes (Addendum)
[facility] Emergency Department Provider Note  ____________________________________________   First MD Initiated Contact with Patient 07/16/19 [phone]     (approximate)  I have reviewed the triage vital signs and the nursing notes.   HISTORY  Chief Complaint Respiratory Distress  Level 5 caveat:  history/ROS limited by acute/critical illness  HPI Lacey Coleman is a 85 y.o. female with almost no chronic medical issues and who reports she has not been to the doctor in 50 years.  She presents by EMS in severe respiratory distress.  The symptoms started about 3 hours ago and has steadily been getting worse.  When EMS arrived they found that she had an SPO2 in the 70s with significantly increased work of breathing, pale, diaphoretic.  They started on CPAP and gave her a dose of Solu-Medrol 125 mg IV.  She reported she felt better upon arrival but was still in moderate to severe respiratory distress and unable to speak in full sentences.  She has no smoking history, no prior history of cardiac disease, no history of COPD, no history of CHF.  She does have some swelling in her lower extremities bilaterally.  She reports chest pain associated with the shortness of breath.         Past Medical History:  Diagnosis Date  . Rheumatoid arthritis [facility])     Patient Active Problem List   Diagnosis Date Noted  . CHF (congestive heart failure) (HCC) 07/16/2019  . Acute respiratory failure (HCC) 07/16/2019    History reviewed. No pertinent surgical history.  Prior to Admission medications   Not on File    Allergies Patient has no known allergies.  History reviewed. No pertinent family history.  Social History Social History   Tobacco Use  . Smoking status: Never Smoker  . Smokeless tobacco: Never Used  Substance Use Topics  . Alcohol use: Not Currently    Frequency: Never  . Drug use: Not Currently    Review of Systems Level 5 caveat:  history/ROS  limited by acute/critical illness.  The patient reports chest pain and shortness of breath. ____________________________________________   PHYSICAL EXAM:  ED Triage Vitals  Enc Vitals Group     BP 07/16/19 0145 (!) 165/91     Pulse Rate 07/16/19 0145 (!) 131     Resp 07/16/19 0145 (!) 25     Temp 07/16/19 0212 (!) 94.6 F (34.8 C)     Temp Source 07/16/19 0212 Axillary     SpO2 07/16/19 0139 (!) 70 %     Weight 07/16/19 0212 68 kg (150 lb)     Height 07/16/19 0212 1.651 m (5\' 5" )     Head Circumference --      Peak Flow --      Pain Score 07/16/19 0151 6     Pain Loc --      Pain Edu? --      Excl. in GC? --       Constitutional: Alert and oriented.  Moderate to severe respiratory distress upon arrival to the ED. Eyes: Conjunctivae are normal.  Head: Atraumatic. Nose: No congestion/rhinnorhea. Mouth/Throat: Mucous membranes are moist. Neck: No stridor.  No meningeal signs.   Cardiovascular: Tachycardia, regular rhythm.  Decreased peripheral circulation. Grossly normal heart sounds. Respiratory: Increased accessory muscle usage, intercostal muscle retractions, tachypnea.  Patient is moving air throughout, minimally abnormal breath sounds with some wheezing throughout in both expiratory and expiratory phases. Gastrointestinal: Soft and nontender. No distention.  Musculoskeletal: 1+ pitting  edema in bilateral lower extremities. No gross deformities of extremities. Neurologic:  Normal speech and language. No gross focal neurologic deficits are appreciated.  Skin:  Skin is pale, cool, diaphoretic and intact.   ____________________________________________   LABS (all labs ordered are listed, but only abnormal results are displayed)  Labs Reviewed  COMPREHENSIVE METABOLIC PANEL - Abnormal; Notable for the following components:      Result Value   CO2 19 (*)    Glucose, Bld 314 (*)    Total Protein 6.4 (*)    Albumin 3.3 (*)    All other components within normal limits   BRAIN NATRIURETIC PEPTIDE - Abnormal; Notable for the following components:   B Natriuretic Peptide 2,027.0 (*)    All other components within normal limits  CBC WITH DIFFERENTIAL/PLATELET - Abnormal; Notable for the following components:   WBC 15.2 (*)    Hemoglobin 11.7 (*)    MCV 79.2 (*)    MCH 23.4 (*)    MCHC 29.6 (*)    RDW 16.0 (*)    Neutro Abs 9.1 (*)    Lymphs Abs 4.9 (*)    All other components within normal limits  URINALYSIS, ROUTINE W REFLEX MICROSCOPIC - Abnormal; Notable for the following components:   Color, Urine YELLOW (*)    APPearance HAZY (*)    Glucose, UA 50 (*)    Ketones, ur 5 (*)    Protein, ur 100 (*)    Bacteria, UA FEW (*)    All other components within normal limits  TROPONIN I (HIGH SENSITIVITY) - Abnormal; Notable for the following components:   Troponin I (High Sensitivity) 92 (*)    All other components within normal limits  SARS CORONAVIRUS 2 (HOSPITAL ORDER, PERFORMED IN [facility])  URINE CULTURE  MAGNESIUM  PROTIME-INR  PROCALCITONIN  APTT  CBC WITH DIFFERENTIAL/PLATELET  HEMOGLOBIN A1C  HEPARIN LEVEL (UNFRACTIONATED)  TROPONIN I (HIGH SENSITIVITY)   ____________________________________________  EKG  ED ECG REPORT I, Loleta Roseory Latrisha Coiro, the attending physician, personally viewed and interpreted this ECG.  Date: 07/16/2019 EKG Time: 1:38 AM Rate: 133 Rhythm: Sinus tachycardia QRS Axis: normal Intervals: normal ST/T Wave abnormalities: Non-specific ST segment / T-wave changes, but no clear evidence of acute ischemia. Narrative Interpretation: no definitive evidence of acute ischemia; does not meet STEMI criteria.   ____________________________________________  RADIOLOGY I, Loleta Roseory Lissete Maestas, personally viewed and evaluated these images (plain radiographs) as part of my medical decision making, as well as reviewing the written report by the radiologist.  ED MD interpretation:  no overt pulmonary edema  Official  radiology report(s): Dg Chest Portable 1 View  Result Date: 07/16/2019 CLINICAL DATA:  Shortness of breath EXAM: PORTABLE CHEST 1 VIEW COMPARISON:  None. FINDINGS: Small pleural effusions with bibasilar atelectasis. No other consolidation. No pulmonary edema or pneumothorax. Cardiomediastinal contours are normal. IMPRESSION: Small bilateral pleural effusions with bibasilar atelectasis. Electronically Signed   By: Deatra RobinsonKevin  Herman M.D.   On: 07/16/2019 02:13    ____________________________________________   PROCEDURES   Procedure(s) performed (including Critical Care):  .Critical Care Performed by: Loleta RoseForbach, Vanessa Kampf, MD Authorized by: Loleta RoseForbach, Nakeisha Greenhouse, MD   Critical care provider statement:    Critical care time (minutes):  60   Critical care time was exclusive of:  Separately billable procedures and treating other patients   Critical care was necessary to treat or prevent imminent or life-threatening deterioration of the following conditions:  Respiratory failure   Critical care was time spent personally by me on  the following activities:  Development of treatment plan with patient or surrogate, discussions with consultants, evaluation of patient's response to treatment, examination of patient, obtaining history from patient or surrogate, ordering and performing treatments and interventions, ordering and review of laboratory studies, ordering and review of radiographic studies, pulse oximetry, re-evaluation of patient's condition and review of old charts     ____________________________________________   INITIAL IMPRESSION / MDM / ASSESSMENT AND PLAN / ED COURSE  As part of my medical decision making, I reviewed the following data within the electronic MEDICAL RECORD NUMBER History obtained from family, Nursing notes reviewed and incorporated, Labs reviewed , EKG interpreted , Old chart reviewed, Radiograph reviewed , Discussed with admitting physician (Dr. Arville Care) and Notes from prior ED visits    Differential diagnosis includes, but is not limited to, ACS, acute CHF, PE, pneumonia, COVID-19, COPD.  The patient is not in shock given that her blood pressure is elevated but she is cool and diaphoretic.  Her blood pressure was initially very elevated for EMS and they put an inch of nitroglycerin paste on her chest which is still present.  We started her on BiPAP immediately upon arrival.  EKG is not consistent with STEMI.  Standard labs have been sent including COVID-19 swab.  The patient started feeling better within a few minutes on BiPAP.  At this point (1:58 AM), she is speaking in full sentences through the BiPAP and conversing with the staff.  She was in extremis upon arrival and I was concerned she would need emergent intubation but she started to improve immediately.  She gave her verbal consent to intubation if it was necessary but at this point I do not think she needs emergent intubation.  She has no history of smoking or any lung disease so we will hold off on any additional medications such as albuterol.  She received Solu-Medrol by EMS.  This does not appear to be infectious.  I think she most likely had a small cardiac issue such as NSTEMI which has led to heart failure with acute decompensation.  I will continue on BiPAP and await the rest of her results.      Clinical Course as of Jul 15 414  Sun Jul 16, 2019  0200 WBC(!): 15.2 [CF]  0218 B Natriuretic Peptide(!): 2,027.0 [CF]  0221 The patient's temperature of 94.6 is not accurate; this was an axillary temperature and I believe does not reflect her actual core temperature.  Nursing staff will obtain a rectal temperature.  Although she is tachycardic and tachypneic and hypoxemic, I think this is much more likely the results of acute coronary syndrome leading to acute heart failure then it is due to sepsis.  Her white count of 15 is likely reactive in the setting of her respiratory failure.  I have added on a procalcitonin and will  check a rectal temperature and a urinalysis.  I am holding on empiric antibiotics.  I have ordered coag studies and heparin bolus and infusion as well as a full dose aspirin.   [CF]  0225 High-sensitivity troponin is 92 which could be reflective of NSTEMI  and/or demand ischemia from acute failure.   [CF]  0243 Small bilateral pleural effusions but no evidence of pulmonary edema nor pneumonia.  DG Chest Portable 1 View [CF]  0256 COVID-19 negative.  Contacted hospitalists (Drs. Sherryll Burger and [facility]) via secure text for admission.  Will update family.   [CF]  0256 Of note, pulmonary embolism is possible, but given  the patient's respiratory distress she cannot lie flat at this time.  I am starting heparin regardless but the hospitalist team may wish to consider CTA in the future when she is more stable.   [CF]  0300 Dr. Sidney Ace acknowledged admission   [CF]  0311 Given the decompensated HF, giving furosemide 20 mg IV.  BP is now in the 138/80 range, will continue for now with nitro paste.   [CF]  9629 Updated family   [CF]  0317 Rectal temperature is normal  Temp: 98 F (36.7 C) [CF]  0317 Of note, family reported that onset of symptoms was 3 hrs PTA as reported previously, and that she has not had any recent infectious symptoms.   [CF]  0415 Procalcitonin: <0.10 [CF]    Clinical Course User Index [CF] Hinda Kehr, MD     ____________________________________________  FINAL CLINICAL IMPRESSION(S) / ED DIAGNOSES  Final diagnoses:  Acute respiratory failure with hypoxemia ([place])  Acute heart failure, unspecified heart failure type ([place])  Elevated troponin     MEDICATIONS GIVEN DURING THIS VISIT:  Medications  heparin bolus via infusion 4,000 Units (4,000 Units Intravenous Bolus from Bag 07/16/19 0343)    Followed by  heparin ADULT infusion 100 units/mL (25000 units/257mL sodium chloride 0.45%) (800 Units/hr Intravenous New Bag/Given 07/16/19 0346)  sodium chloride flush (NS) 0.9 %  injection 3 mL (has no administration in time range)  0.9 %  sodium chloride infusion (has no administration in time range)  acetaminophen (TYLENOL) tablet 650 mg (has no administration in time range)  ondansetron (ZOFRAN) injection 4 mg (has no administration in time range)  furosemide (LASIX) injection 20 mg (has no administration in time range)  lisinopril (ZESTRIL) tablet 5 mg (has no administration in time range)  zolpidem (AMBIEN) tablet 5 mg (has no administration in time range)  labetalol (NORMODYNE) injection 20 mg (has no administration in time range)  hydrALAZINE (APRESOLINE) injection 10 mg (has no administration in time range)  famotidine (PEPCID) tablet 20 mg (has no administration in time range)  insulin aspart (novoLOG) injection 0-20 Units (has no administration in time range)  insulin regular (NOVOLIN R) 100 units/mL injection 10 Units (has no administration in time range)  nitroGLYCERIN (NITROGLYN) 2 % ointment 1 inch (has no administration in time range)  nitroGLYCERIN (NITROSTAT) SL tablet 0.4 mg (has no administration in time range)  morphine 2 MG/ML injection 2 mg (has no administration in time range)  aspirin EC tablet 325 mg (has no administration in time range)  aspirin chewable tablet 324 mg (324 mg Oral Given 07/16/19 0248)  furosemide (LASIX) injection 20 mg (20 mg Intravenous Given 07/16/19 5284)     ED Discharge Orders    None      *Please note:  JAZSMINE MACARI was evaluated in Emergency Department on 07/16/2019 for the symptoms described in the history of present illness. She was evaluated in the context of the global COVID-19 pandemic, which necessitated consideration that the patient might be at risk for infection with the SARS-CoV-2 virus that causes COVID-19. Institutional protocols and algorithms that pertain to the evaluation of patients at risk for COVID-19 are in a state of rapid change based on information released by regulatory bodies including the CDC  and federal and state organizations. These policies and algorithms were followed during the patient's care in the ED.  Some ED evaluations and interventions may be delayed as a result of limited staffing during the pandemic.*  Note:  This document was prepared  using Conservation officer, historic buildingsDragon voice recognition software and may include unintentional dictation errors.   Loleta RoseForbach, Tynia Wiers, MD 07/16/19 [phone]    Loleta RoseForbach, Buzz Axel, MD 07/16/19 [phone]

## 2019-07-16 NOTE — ED Notes (Signed)
Pt's purewick not properly placed and pt is incontinent of a large amount of urine; chux and sheet saturated;

## 2019-07-16 NOTE — Consult Note (Signed)
ANTICOAGULATION CONSULT NOTE  Pharmacy Consult for Heparin infusion  Indication: chest pain/ACS/NSTEMI  No Known Allergies  Patient Measurements: Height: 5\' 5"  (165.1 cm) Weight: 155 lb 11.2 oz (70.6 kg) IBW/kg (Calculated) : 57  Vital Signs: Temp: 98.5 F (36.9 C) (09/13 1200) Temp Source: Oral (09/13 1200) BP: 113/65 (09/13 1200) Pulse Rate: 107 (09/13 1200)  Labs: Recent Labs    07/16/19 0141 07/16/19 0348 07/16/19 0617 07/16/19 1237  HGB 11.7*  --  11.4*  --   HCT 39.5  --  37.1  --   PLT 296  --  279  --   APTT <24*  --   --   --   LABPROT 15.2  --   --   --   INR 1.2  --   --   --   HEPARINUNFRC  --   --   --  0.17*  CREATININE 0.86  --   --   --   TROPONINIHS 92* 193* 599*  --     Estimated Creatinine Clearance: 46.3 mL/min (by C-G formula based on SCr of 0.86 mg/dL).   Medical History: Past Medical History:  Diagnosis Date  . Rheumatoid arthritis [facility])     Assessment: Pharmacy consulted for heparin drip management for 85 yo female admitted with new onset CHF, suspected ACS/NSTEMI, and hypertensive urgency. Patient not on anticoagulants as an outpatient. Heparin infusing at 800 units/hr.   Goal of Therapy:  Heparin level 0.3-0.7 units/ml Monitor platelets by anticoagulation protocol: Yes   Plan:  Confirmed with RN no interruptions in therapy. Will bolus 2000 units x 1 and increase rate to 950 units/hr. Anti-Xa level at 1900. CBC with am labs.   Pharmacy will continue to monitor and adjust per consult.   Simpson,Michael L, RPh 07/16/2019 1:07 PM

## 2019-07-16 NOTE — ED Triage Notes (Signed)
Ptpresents via EMS in respiratory distress. EMS reported sudden onset, PMH negligible. Pt pale, tachycardiac, 3+ bilateral pitting edema in legs. Pt w/ 3 hr hx of respiratory distress prior to EMS transport.

## 2019-07-17 ENCOUNTER — Encounter
Admission: EM | Disposition: A | Discharge status: Other Acute Inpatient Hospital | Payer: Self-pay | Source: Home / Self Care | Attending: Internal Medicine

## 2019-07-17 ENCOUNTER — Other Ambulatory Visit: Payer: Self-pay

## 2019-07-17 ENCOUNTER — Inpatient Hospital Stay (HOSPITAL_COMMUNITY): Payer: Medicare Other

## 2019-07-17 ENCOUNTER — Encounter: Payer: Self-pay | Admitting: Physician Assistant

## 2019-07-17 ENCOUNTER — Inpatient Hospital Stay (HOSPITAL_COMMUNITY)
Admission: AD | Admit: 2019-07-17 | Discharge: 2019-07-24 | DRG: 235 | Disposition: A | Discharge status: Home / Self Care | Payer: Medicare Other | Source: Other Acute Inpatient Hospital | Attending: Cardiothoracic Surgery | Admitting: Cardiothoracic Surgery

## 2019-07-17 DIAGNOSIS — I6389 Other cerebral infarction: Secondary | ICD-10-CM | POA: Diagnosis not present

## 2019-07-17 DIAGNOSIS — I25110 Atherosclerotic heart disease of native coronary artery with unstable angina pectoris: Secondary | ICD-10-CM | POA: Diagnosis present

## 2019-07-17 DIAGNOSIS — I214 Non-ST elevation (NSTEMI) myocardial infarction: Secondary | ICD-10-CM | POA: Diagnosis present

## 2019-07-17 DIAGNOSIS — I249 Acute ischemic heart disease, unspecified: Secondary | ICD-10-CM | POA: Diagnosis not present

## 2019-07-17 DIAGNOSIS — R29704 NIHSS score 4: Secondary | ICD-10-CM | POA: Diagnosis not present

## 2019-07-17 DIAGNOSIS — M069 Rheumatoid arthritis, unspecified: Secondary | ICD-10-CM | POA: Diagnosis present

## 2019-07-17 DIAGNOSIS — I340 Nonrheumatic mitral (valve) insufficiency: Secondary | ICD-10-CM | POA: Diagnosis not present

## 2019-07-17 DIAGNOSIS — I480 Paroxysmal atrial fibrillation: Secondary | ICD-10-CM | POA: Diagnosis not present

## 2019-07-17 DIAGNOSIS — R0603 Acute respiratory distress: Secondary | ICD-10-CM | POA: Diagnosis not present

## 2019-07-17 DIAGNOSIS — E876 Hypokalemia: Secondary | ICD-10-CM

## 2019-07-17 DIAGNOSIS — Z09 Encounter for follow-up examination after completed treatment for conditions other than malignant neoplasm: Secondary | ICD-10-CM

## 2019-07-17 DIAGNOSIS — Z7982 Long term (current) use of aspirin: Secondary | ICD-10-CM

## 2019-07-17 DIAGNOSIS — E119 Type 2 diabetes mellitus without complications: Secondary | ICD-10-CM | POA: Diagnosis present

## 2019-07-17 DIAGNOSIS — Z79899 Other long term (current) drug therapy: Secondary | ICD-10-CM

## 2019-07-17 DIAGNOSIS — I110 Hypertensive heart disease with heart failure: Secondary | ICD-10-CM | POA: Diagnosis present

## 2019-07-17 DIAGNOSIS — Z01810 Encounter for preprocedural cardiovascular examination: Secondary | ICD-10-CM | POA: Diagnosis not present

## 2019-07-17 DIAGNOSIS — I10 Essential (primary) hypertension: Secondary | ICD-10-CM

## 2019-07-17 DIAGNOSIS — I5023 Acute on chronic systolic (congestive) heart failure: Secondary | ICD-10-CM | POA: Diagnosis present

## 2019-07-17 DIAGNOSIS — D649 Anemia, unspecified: Secondary | ICD-10-CM | POA: Diagnosis present

## 2019-07-17 DIAGNOSIS — Z951 Presence of aortocoronary bypass graft: Secondary | ICD-10-CM

## 2019-07-17 DIAGNOSIS — Z9889 Other specified postprocedural states: Secondary | ICD-10-CM

## 2019-07-17 DIAGNOSIS — Z8249 Family history of ischemic heart disease and other diseases of the circulatory system: Secondary | ICD-10-CM

## 2019-07-17 DIAGNOSIS — I2510 Atherosclerotic heart disease of native coronary artery without angina pectoris: Secondary | ICD-10-CM | POA: Diagnosis present

## 2019-07-17 DIAGNOSIS — I509 Heart failure, unspecified: Secondary | ICD-10-CM

## 2019-07-17 DIAGNOSIS — R4701 Aphasia: Secondary | ICD-10-CM | POA: Diagnosis not present

## 2019-07-17 DIAGNOSIS — I5021 Acute systolic (congestive) heart failure: Secondary | ICD-10-CM | POA: Diagnosis present

## 2019-07-17 DIAGNOSIS — I083 Combined rheumatic disorders of mitral, aortic and tricuspid valves: Secondary | ICD-10-CM | POA: Diagnosis present

## 2019-07-17 DIAGNOSIS — I272 Pulmonary hypertension, unspecified: Secondary | ICD-10-CM | POA: Diagnosis present

## 2019-07-17 DIAGNOSIS — I639 Cerebral infarction, unspecified: Secondary | ICD-10-CM | POA: Diagnosis not present

## 2019-07-17 HISTORY — DX: Atherosclerotic heart disease of native coronary artery without angina pectoris: I25.10

## 2019-07-17 HISTORY — DX: Essential (primary) hypertension: I10

## 2019-07-17 HISTORY — DX: Unspecified systolic (congestive) heart failure: I50.20

## 2019-07-17 HISTORY — PX: RIGHT/LEFT HEART CATH AND CORONARY ANGIOGRAPHY: CATH118266

## 2019-07-17 LAB — BASIC METABOLIC PANEL
Anion gap: 8 (ref 5–15)
BUN: 17 mg/dL (ref 8–23)
CO2: 27 mmol/L (ref 22–32)
Calcium: 9.5 mg/dL (ref 8.9–10.3)
Chloride: 104 mmol/L (ref 98–111)
Creatinine, Ser: 0.58 mg/dL (ref 0.44–1.00)
GFR calc Af Amer: >60 mL/min (ref >60)
GFR calc non Af Amer: >60 mL/min (ref >60)
Glucose, Bld: 104 mg/dL — ABNORMAL HIGH (ref 70–99)
Potassium: 3.4 mmol/L — ABNORMAL LOW (ref 3.5–5.1)
Sodium: 139 mmol/L (ref 135–145)

## 2019-07-17 LAB — CBC
HCT: 38.2 % (ref 36.0–46.0)
Hemoglobin: 12.4 g/dL (ref 12.0–15.0)
MCH: 24.5 pg — ABNORMAL LOW (ref 26.0–34.0)
MCHC: 32.5 g/dL (ref 30.0–36.0)
MCV: 75.5 fL — ABNORMAL LOW (ref 80.0–100.0)
Platelets: 279 10*3/uL (ref 150–400)
RBC: 5.06 MIL/uL (ref 3.87–5.11)
RDW: 15.8 % — ABNORMAL HIGH (ref 11.5–15.5)
WBC: 8.9 10*3/uL (ref 4.0–10.5)
nRBC: 0 % (ref 0.0–0.2)

## 2019-07-17 LAB — CBC WITH DIFFERENTIAL/PLATELET
Abs Immature Granulocytes: 0.04 10*3/uL (ref 0.00–0.07)
Basophils Absolute: 0 10*3/uL (ref 0.0–0.1)
Basophils Relative: 0 %
Eosinophils Absolute: 0 10*3/uL (ref 0.0–0.5)
Eosinophils Relative: 0 %
HCT: 33.1 % — ABNORMAL LOW (ref 36.0–46.0)
Hemoglobin: 10.3 g/dL — ABNORMAL LOW (ref 12.0–15.0)
Immature Granulocytes: 0 %
Lymphocytes Relative: 22 %
Lymphs Abs: 2.1 10*3/uL (ref 0.7–4.0)
MCH: 23.4 pg — ABNORMAL LOW (ref 26.0–34.0)
MCHC: 31.1 g/dL (ref 30.0–36.0)
MCV: 75.1 fL — ABNORMAL LOW (ref 80.0–100.0)
Monocytes Absolute: 0.7 10*3/uL (ref 0.1–1.0)
Monocytes Relative: 8 %
Neutro Abs: 6.3 10*3/uL (ref 1.7–7.7)
Neutrophils Relative %: 70 %
Platelets: 257 10*3/uL (ref 150–400)
RBC: 4.41 MIL/uL (ref 3.87–5.11)
RDW: 15.9 % — ABNORMAL HIGH (ref 11.5–15.5)
WBC: 9.2 10*3/uL (ref 4.0–10.5)
nRBC: 0 % (ref 0.0–0.2)

## 2019-07-17 LAB — CREATININE, SERUM
Creatinine, Ser: 0.77 mg/dL (ref 0.44–1.00)
GFR calc Af Amer: >60 mL/min (ref >60)
GFR calc non Af Amer: >60 mL/min (ref >60)

## 2019-07-17 LAB — GLUCOSE, CAPILLARY
Glucose-Capillary: 103 mg/dL — ABNORMAL HIGH (ref 70–99)
Glucose-Capillary: 116 mg/dL — ABNORMAL HIGH (ref 70–99)
Glucose-Capillary: 126 mg/dL — ABNORMAL HIGH (ref 70–99)

## 2019-07-17 LAB — HEPARIN LEVEL (UNFRACTIONATED): Heparin Unfractionated: 0.2 IU/mL — ABNORMAL LOW (ref 0.30–0.70)

## 2019-07-17 SURGERY — RIGHT/LEFT HEART CATH AND CORONARY ANGIOGRAPHY
Anesthesia: Moderate Sedation

## 2019-07-17 MED ORDER — ASPIRIN 325 MG PO TBEC: 325.0000 mg | DELAYED_RELEASE_TABLET | Freq: Every day | ORAL | 0 refills | Status: DC

## 2019-07-17 MED ORDER — LISINOPRIL 5 MG PO TABS: 5.0000 mg | ORAL_TABLET | Freq: Every day | ORAL | 0 refills | Status: DC

## 2019-07-17 MED ORDER — ENOXAPARIN SODIUM 40 MG/0.4ML ~~LOC~~ SOLN
40.0000 mg | SUBCUTANEOUS | Status: DC
  Administered 2019-07-17: 21:00:00 40 mg via SUBCUTANEOUS
  Filled 2019-07-17: qty 0.4

## 2019-07-17 MED ORDER — HEPARIN (PORCINE) IN NACL 1000-0.9 UT/500ML-% IV SOLN
INTRAVENOUS | Status: AC
  Filled 2019-07-17: qty 1000

## 2019-07-17 MED ORDER — HEPARIN SODIUM (PORCINE) 1000 UNIT/ML IJ SOLN
INTRAMUSCULAR | Status: DC | PRN
  Administered 2019-07-17: 3500 [IU] via INTRAVENOUS

## 2019-07-17 MED ORDER — NITROGLYCERIN 0.4 MG SL SUBL: 0.4000 mg | SUBLINGUAL_TABLET | SUBLINGUAL | Status: DC | PRN

## 2019-07-17 MED ORDER — ONDANSETRON HCL 4 MG/2ML IJ SOLN: 4.0000 mg | Freq: Four times a day (QID) | INTRAMUSCULAR | Status: DC | PRN

## 2019-07-17 MED ORDER — ASPIRIN EC 81 MG PO TBEC
81.0000 mg | DELAYED_RELEASE_TABLET | Freq: Every day | ORAL | Status: DC
  Administered 2019-07-17 – 2019-07-18 (×2): 81 mg via ORAL
  Filled 2019-07-17 (×2): qty 1

## 2019-07-17 MED ORDER — CARVEDILOL 3.125 MG PO TABS
3.1250 mg | ORAL_TABLET | Freq: Two times a day (BID) | ORAL | Status: DC
  Administered 2019-07-17: 19:00:00 3.125 mg via ORAL
  Filled 2019-07-17: qty 1

## 2019-07-17 MED ORDER — METOPROLOL TARTRATE 12.5 MG HALF TABLET: 12.5000 mg | ORAL_TABLET | Freq: Two times a day (BID) | ORAL | Status: DC

## 2019-07-17 MED ORDER — HEPARIN SODIUM (PORCINE) 1000 UNIT/ML IJ SOLN
INTRAMUSCULAR | Status: AC
  Filled 2019-07-17: qty 1

## 2019-07-17 MED ORDER — FENTANYL CITRATE (PF) 100 MCG/2ML IJ SOLN
INTRAMUSCULAR | Status: DC | PRN
  Administered 2019-07-17: 25 ug via INTRAVENOUS

## 2019-07-17 MED ORDER — CARVEDILOL 3.125 MG PO TABS: 3.1250 mg | ORAL_TABLET | Freq: Two times a day (BID) | ORAL | 0 refills | Status: DC

## 2019-07-17 MED ORDER — HEPARIN BOLUS VIA INFUSION
1000.0000 [IU] | Freq: Once | INTRAVENOUS | Status: AC
  Administered 2019-07-17: 07:00:00 1000 [IU] via INTRAVENOUS
  Filled 2019-07-17: qty 1000

## 2019-07-17 MED ORDER — FENTANYL CITRATE (PF) 100 MCG/2ML IJ SOLN
INTRAMUSCULAR | Status: AC
  Filled 2019-07-17: qty 2

## 2019-07-17 MED ORDER — VERAPAMIL HCL 2.5 MG/ML IV SOLN
INTRAVENOUS | Status: AC
  Filled 2019-07-17: qty 2

## 2019-07-17 MED ORDER — METOPROLOL TARTRATE 25 MG PO TABS: 25.0000 mg | ORAL_TABLET | Freq: Two times a day (BID) | ORAL | Status: DC

## 2019-07-17 MED ORDER — DIGOXIN 125 MCG PO TABS
0.1250 mg | ORAL_TABLET | Freq: Every day | ORAL | Status: DC
  Administered 2019-07-17 – 2019-07-18 (×2): 0.125 mg via ORAL
  Filled 2019-07-17 (×2): qty 1

## 2019-07-17 MED ORDER — ACETAMINOPHEN 325 MG PO TABS
650.0000 mg | ORAL_TABLET | ORAL | Status: DC | PRN
  Administered 2019-07-18: 650 mg via ORAL
  Filled 2019-07-17: qty 2

## 2019-07-17 MED ORDER — HEPARIN (PORCINE) IN NACL 2000-0.9 UNIT/L-% IV SOLN
INTRAVENOUS | Status: DC | PRN
  Administered 2019-07-17: 500 mL

## 2019-07-17 MED ORDER — IOHEXOL 300 MG/ML  SOLN
INTRAMUSCULAR | Status: DC | PRN
  Administered 2019-07-17: 80 mL via INTRA_ARTERIAL

## 2019-07-17 MED ORDER — CARVEDILOL 3.125 MG PO TABS: 3.1250 mg | ORAL_TABLET | Freq: Two times a day (BID) | ORAL | Status: DC

## 2019-07-17 MED ORDER — SODIUM CHLORIDE 0.9% FLUSH: 3.0000 mL | INTRAVENOUS | Status: DC | PRN

## 2019-07-17 MED ORDER — SODIUM CHLORIDE 0.9% FLUSH: 3.0000 mL | Freq: Two times a day (BID) | INTRAVENOUS | Status: DC

## 2019-07-17 MED ORDER — SODIUM CHLORIDE 0.9% FLUSH
3.0000 mL | Freq: Two times a day (BID) | INTRAVENOUS | Status: DC
  Administered 2019-07-17 – 2019-07-18 (×2): 3 mL via INTRAVENOUS

## 2019-07-17 MED ORDER — MIDAZOLAM HCL 2 MG/2ML IJ SOLN
INTRAMUSCULAR | Status: AC
  Filled 2019-07-17: qty 2

## 2019-07-17 MED ORDER — METOPROLOL TARTRATE 12.5 MG HALF TABLET
12.5000 mg | ORAL_TABLET | Freq: Two times a day (BID) | ORAL | Status: DC
  Administered 2019-07-17 – 2019-07-18 (×3): 12.5 mg via ORAL
  Filled 2019-07-17 (×3): qty 1

## 2019-07-17 MED ORDER — LOSARTAN POTASSIUM 25 MG PO TABS
12.5000 mg | ORAL_TABLET | Freq: Every day | ORAL | Status: DC
  Administered 2019-07-17: 12.5 mg via ORAL
  Filled 2019-07-17: qty 1

## 2019-07-17 MED ORDER — ATORVASTATIN CALCIUM 40 MG PO TABS: 40.0000 mg | ORAL_TABLET | Freq: Every day | ORAL | 0 refills | Status: AC

## 2019-07-17 MED ORDER — FUROSEMIDE 10 MG/ML IJ SOLN
20.0000 mg | Freq: Once | INTRAMUSCULAR | Status: AC
  Administered 2019-07-17: 20 mg via INTRAVENOUS
  Filled 2019-07-17: qty 2

## 2019-07-17 MED ORDER — SODIUM CHLORIDE 0.9 % IV SOLN: 250.0000 mL | INTRAVENOUS | Status: DC | PRN

## 2019-07-17 MED ORDER — HEPARIN (PORCINE) 25000 UT/250ML-% IV SOLN
850.0000 [IU]/h | INTRAVENOUS | Status: DC
  Administered 2019-07-17: 23:00:00 850 [IU]/h via INTRAVENOUS
  Filled 2019-07-17: qty 250

## 2019-07-17 MED ORDER — FUROSEMIDE 10 MG/ML IJ SOLN: 20.0000 mg | Freq: Two times a day (BID) | INTRAMUSCULAR | Status: DC

## 2019-07-17 MED ORDER — FUROSEMIDE 10 MG/ML IJ SOLN
INTRAMUSCULAR | Status: AC
  Filled 2019-07-17: qty 4

## 2019-07-17 MED ORDER — FUROSEMIDE 10 MG/ML IJ SOLN
INTRAMUSCULAR | Status: DC | PRN
  Administered 2019-07-17: 40 mg via INTRAVENOUS

## 2019-07-17 MED ORDER — ATORVASTATIN CALCIUM 40 MG PO TABS
40.0000 mg | ORAL_TABLET | Freq: Every day | ORAL | Status: DC
  Administered 2019-07-17 – 2019-07-23 (×6): 40 mg via ORAL
  Filled 2019-07-17 (×6): qty 1

## 2019-07-17 MED ORDER — VERAPAMIL HCL 2.5 MG/ML IV SOLN
INTRAVENOUS | Status: DC | PRN
  Administered 2019-07-17: 2.5 mg via INTRA_ARTERIAL

## 2019-07-17 MED ORDER — MIDAZOLAM HCL 2 MG/2ML IJ SOLN
INTRAMUSCULAR | Status: DC | PRN
  Administered 2019-07-17: 1 mg via INTRAVENOUS

## 2019-07-17 MED ORDER — ASPIRIN EC 325 MG PO TBEC
DELAYED_RELEASE_TABLET | ORAL | Status: AC
  Filled 2019-07-17: qty 1

## 2019-07-17 MED ORDER — NITROGLYCERIN 0.4 MG SL SUBL: 0.4000 mg | SUBLINGUAL_TABLET | SUBLINGUAL | 0 refills | Status: DC | PRN

## 2019-07-17 MED ORDER — SPIRONOLACTONE 12.5 MG HALF TABLET
12.5000 mg | ORAL_TABLET | Freq: Every day | ORAL | Status: DC
  Administered 2019-07-17 – 2019-07-18 (×2): 12.5 mg via ORAL
  Filled 2019-07-17 (×3): qty 1

## 2019-07-17 SURGICAL SUPPLY — 11 items
CATH 5F 110X4 TIG (CATHETERS) ×3 IMPLANT
CATH AMP RT 5F (CATHETERS) ×3 IMPLANT
CATH BALLN WEDGE 5F 110CM (CATHETERS) ×3 IMPLANT
CATH INFINITI JR4 5F (CATHETERS) ×3 IMPLANT
DEVICE RAD TR BAND REGULAR (VASCULAR PRODUCTS) ×3 IMPLANT
GLIDESHEATH SLEND SS 6F .021 (SHEATH) ×3 IMPLANT
KIT MANI 3VAL PERCEP (MISCELLANEOUS) ×3 IMPLANT
KIT RIGHT HEART (MISCELLANEOUS) ×3 IMPLANT
PACK CARDIAC CATH (CUSTOM PROCEDURE TRAY) ×3 IMPLANT
SHEATH GLIDE SLENDER 4/5FR (SHEATH) ×3 IMPLANT
WIRE ROSEN-J .035X260CM (WIRE) ×3 IMPLANT

## 2019-07-17 NOTE — Consult Note (Addendum)
Advanced Heart Failure Team Consult Note   Primary Physician: Patient, No Pcp Per PCP-Cardiologist:  No primary care provider on file.  Reason for Consultation: Heart Failure   HPI:    Lacey Coleman is seen today for evaluation of heart failure at the request of Dr Kirke CorinArida.   Lacey Coleman is an 85 year old with a history of rheumatoid arthritis. She has not been seen by a medical provider in over 50 years.   She has been having chest pain dating back to 2003.  Over the last 3-4 months she had chest pain every day. In the last 2-3 weeks she noticed a  functional decline requiring rest breaks when performing ADLs. Sleeping in a recliner due to increased shortness of breath.  She also had abdominal bloating and leg edema. She has been taking aspirin daily and aleve as needed. She does not smoke or drink alcohol. She has 6 children that live in the area. Her son lives with her.   Yesterday [facility] after experiencing several hours of severe CP with increased shortness of breath. In the ED she was placed on Bipap. Pertinent admssion labs included: HS-Trop [id number]>193>599, Pro BNP 2027, creatinine 0.86, K 4.1, WBC 15.2, and procalcitonin < 0.10. ECHO completed and showed LVEF 30-35%, normal RV, and diffuse LV hypokinesis. Bipap was weaned off. Diuresed with IV lasix and set up for heart cath. Heart cath showed severe multivessel disease with 95% LM stenosis, PCWP 25, and CI 1.95. Transferred to [facility] for HF/CT surgery consultation.   LHC/RHC 07/17/19  PCWP 25 PAP 50/24 CO 3.4 CI 1.95   The left ventricular ejection fraction is 25-35% by visual estimate.  There is moderate left ventricular systolic dysfunction.  LV end diastolic pressure is moderately elevated.  Dist LM lesion is 90% stenosed.  Prox Cx lesion is 80% stenosed.  Prox LAD to Mid LAD lesion is 90% stenosed.  1st Diag lesion is 80% stenosed.  Mid Cx lesion is 100% stenosed.  Prox RCA lesion is 90%  stenosed.  Prox RCA to Mid RCA lesion is 100% stenosed.   Review of Systems: [y] = yes, [ ]  = no    General: Weight gain [Y ]; Weight loss [ ] ; Anorexia [ ] ; Fatigue [ Y]; Fever [ ] ; Chills [ ] ; Weakness [ ]    Cardiac: Chest pain/pressure Cove.Etienne[y ]; Resting SOB Cove.Etienne[y ]; Exertional SOB [Y ]; Orthopnea [ ] ; Pedal Edema [ ] ; Palpitations [ ] ; Syncope [ ] ; Presyncope [ ] ; Paroxysmal nocturnal dyspnea[ ]    Pulmonary: Cough [ ] ; Wheezing[ ] ; Hemoptysis[ ] ; Sputum [ ] ; Snoring [ ]    GI: Vomiting[ ] ; Dysphagia[ ] ; Melena[ ] ; Hematochezia [ ] ; Heartburn[ ] ; Abdominal pain [ ] ; Constipation [ ] ; Diarrhea [ ] ; BRBPR [ ]    GU: Hematuria[ ] ; Dysuria [ ] ; Nocturia[ ]    Vascular: Pain in legs with walking [ ] ; Pain in feet with lying flat [ ] ; Non-healing sores [ ] ; Stroke [ ] ; TIA [ ] ; Slurred speech [ ] ;   Neuro: Headaches[ ] ; Vertigo[ ] ; Seizures[ ] ; Paresthesias[ ] ;Blurred vision [ ] ; Diplopia [ ] ; Vision changes [ ]    Ortho/Skin: Arthritis [y]; Joint pain Cove.Etienne[y ]; Muscle pain [ ] ; Joint swelling [ ] ; Back Pain [ ] ; Rash [ ]    Psych: Depression[ ] ; Anxiety[ ]    Heme: Bleeding problems [ ] ; Clotting disorders [ ] ; Anemia [Y ]   Endocrine: Diabetes [ ] ; Thyroid dysfunction[ ]   Home Medications Prior to Admission medications   Medication Sig Start Date End Date Taking? Authorizing Provider  aspirin EC 325 MG EC tablet Take 1 tablet (325 mg total) by mouth daily. 07/17/19   Epifanio Lesches, MD  atorvastatin (LIPITOR) 40 MG tablet Take 1 tablet (40 mg total) by mouth daily at 6 PM. 07/17/19   Epifanio Lesches, MD  carvedilol (COREG) 3.125 MG tablet Take 1 tablet (3.125 mg total) by mouth 2 (two) times daily with a meal. 07/17/19   Epifanio Lesches, MD  lisinopril (ZESTRIL) 5 MG tablet Take 1 tablet (5 mg total) by mouth daily. 07/17/19   Epifanio Lesches, MD  nitroGLYCERIN (NITROSTAT) 0.4 MG SL tablet Place 1 tablet (0.4 mg total) under the tongue every 5 (five) minutes as needed for chest  pain. 07/17/19   Epifanio Lesches, MD    Past Medical History: Past Medical History:  Diagnosis Date   CAD (coronary artery disease)    9/14 LHC with EF 25-35%, mod elevated LVEDP, dLM 90%, pLCx80%, pLAD to mLAD 90%, 1st diag 80%, mid Cx 100%, pRCA 90%, pRCA to mRCA 100%   Heart failure with reduced ejection fraction (HCC)    a) 9/13 echo with EF 30-35%, mild LVF, diffuse hypokinesis, mod aortic calcification, mild AR   Hypertension    Rheumatoid arthritis ([place])     Past Surgical History: Past Surgical History:  Procedure Laterality Date   RIGHT/LEFT HEART CATH AND CORONARY ANGIOGRAPHY N/A 07/17/2019   Procedure: RIGHT/LEFT HEART CATH AND CORONARY ANGIOGRAPHY;  Surgeon: Wellington Hampshire, MD;  Location: [place] [facility];  Service: Cardiovascular;  Laterality: N/A;    Family History: Father had heart disease and died in his 57s.   Social History: Social History   Socioeconomic History   Marital status: Single    Spouse name: Not on file   Number of children: Not on file   Years of education: Not on file   Highest education level: Not on file  Occupational History   Not on file  Social Needs   Financial resource strain: Not on file   Food insecurity    Worry: Not on file    Inability: Not on file   Transportation needs    Medical: Not on file    Non-medical: Not on file  Tobacco Use   Smoking status: Never Smoker   Smokeless tobacco: Never Used  Substance and Sexual Activity   Alcohol use: Not Currently    Frequency: Never   Drug use: Not Currently   Sexual activity: Not on file  Lifestyle   Physical activity    Days per week: Not on file    Minutes per session: Not on file   Stress: Not on file  Relationships   Social connections    Talks on phone: Not on file    Gets together: Not on file    Attends religious service: Not on file    Active member of club or organization: Not on file    Attends meetings of clubs or organizations:  Not on file    Relationship status: Not on file  Other Topics Concern   Not on file  Social History Narrative   Not on file    Allergies:  No Known Allergies  Objective:    Vital Signs:   Temp:  [97.9 F (36.6 C)-98.6 F (37 C)] 97.9 F (36.6 C) (09/14 1512) Pulse Rate:  [84-112] 105 (09/14 1512) Resp:  [16-29] 22 (09/14 1512) BP: (100-157)/(59-92) 157/91 (09/14  1512) SpO2:  [91 %-99 %] 98 % (09/14 1512) Weight:  [62.6 kg-68.1 kg] 62.6 kg (09/14 1512)    Weight change: Filed Weights   07/17/19 1512  Weight: 62.6 kg    Intake/Output:  No intake or output data in the 24 hours ending 07/17/19 1529    Physical Exam    General:  Pale. No resp difficulty HEENT: normal Neck: supple. JVP 8-9 . Carotids 2+ bilat; no bruits. No lymphadenopathy or thyromegaly appreciated. Cor: PMI nondisplaced. Tachy Regular rate & rhythm. No rubs, gallops or murmurs. Lungs: clear on room air  Abdomen: soft, nontender, nondistended. No hepatosplenomegaly. No bruits or masses. Good bowel sounds. Extremities: no cyanosis, clubbing, rash, R and LLE trace edema Neuro: alert & orientedx3, cranial nerves grossly intact. moves all 4 extremities w/o difficulty. Affect pleasant   Telemetry   ST 100s with occasional PVCs.   EKG    Ordered  Labs   Basic Metabolic Panel: Recent Labs  Lab 07/16/19 0141 07/17/19 0440  NA 136 139  K 4.1 3.4*  CL 104 104  CO2 19* 27  GLUCOSE 314* 104*  BUN 16 17  CREATININE 0.86 0.58  CALCIUM 9.6 9.5  MG 2.4  --     Liver Function Tests: Recent Labs  Lab 07/16/19 0141  AST 34  ALT 24  ALKPHOS 95  BILITOT 0.8  PROT 6.4*  ALBUMIN 3.3*   No results for input(s): LIPASE, AMYLASE in the last 168 hours. No results for input(s): AMMONIA in the last 168 hours.  CBC: Recent Labs  Lab 07/16/19 0141 07/16/19 0617 07/17/19 0440  WBC 15.2* 12.0* 9.2  NEUTROABS 9.1* 11.1* 6.3  HGB 11.7* 11.4* 10.3*  HCT 39.5 37.1 33.1*  MCV 79.2* 75.3* 75.1*   PLT 296 279 257    Cardiac Enzymes: No results for input(s): CKTOTAL, CKMB, CKMBINDEX, TROPONINI in the last 168 hours.  BNP: BNP (last 3 results) Recent Labs    07/16/19 0141  BNP 2,027.0*    ProBNP (last 3 results) No results for input(s): PROBNP in the last 8760 hours.   CBG: Recent Labs  Lab 07/16/19 1622 07/16/19 2204 07/17/19 0716 07/17/19 1147 07/17/19 1258  GLUCAP 126* 99 103* 116* 126*    Coagulation Studies: Recent Labs    07/16/19 0141  LABPROT 15.2  INR 1.2     Imaging    No results found.   Medications:     aspirin EC  81 mg Oral Daily   atorvastatin  40 mg Oral q1800   carvedilol  3.125 mg Oral BID WC   digoxin  0.125 mg Oral Daily   enoxaparin (LOVENOX) injection  40 mg Subcutaneous Q24H   losartan  12.5 mg Oral Daily   sodium chloride flush  3 mL Intravenous Q12H   spironolactone  12.5 mg Oral Daily     Patient Profile  Lacey Carmer is an 85 year old with a history of rheumatoid arthritis. She has not been seen medical providers in over 50 years.   Admitted to [facility] 9/13 with chest pain and transferred to [facility] for CT surgery/HF consultation.    Assessment/Plan   1. Acute coronary syndrome - Severe Multivessel Coronary Artery Disease HS Trop 30>092>330 LHC - distal LM lesion 90% stenosed, proximal circumflex 80% stenosis, proximal LAD to mid LAD 90% stenosed, first diagonal lesion 80% stenosed, mid circumflex 100% stenosed, proximal RCA 90% stenosed, and proximal RCA to mid RCA 100% stenosed. CT surgery consulted.  Not currently having chest pain.  On statin, bb, asa.  - On lovenox. Low threshold to add heparin drip if she has recurrent chest pain.   2. Acute Systolic HF, ICM  9/13 ECHO EF 30-35% RV ok  - LHC/RHC multivessel disease, CI 1.9, PCWP 25.  - Volume status mildly elevated. Received IV lasix at [facility] today. Hold off on IV lasix and reassess in am.  - Continue low dose carvedilol  - Add 12.5 mg losartan.  - Add  12.5 mg spironolactone - Add 0.125 mg digoxin.  - BMET in am.   3. HTN  - Adjust meds as above.   4. Anemia  Hgb 11.4> 10.3 - Check anemia panel.   5. Rheumatoid Arthritis.   Length of Stay: 0  Tonye Becket, NP  07/17/2019, 3:29 PM  Advanced Heart Failure Team Pager [phone] (M-F; 7a - 4p)  Please contact [facility] for night-coverage after hours (4p -7a ) and weekends on [email or web address]  Patient seen and examined with Tonye Becket, NP. We discussed all aspects of the encounter. I agree with the assessment and plan as stated above.   85 y/o woman with h/o "rheumatoid" arthritis and little other PMHx. Admitted to [facility] with ACS after several months of progressive angina. Hstrop only mildly elevated. Echo EF 30-35%. Cath with severe 3v CAD including 95% distal LM and totally occluded RCA. (Personally reviewed) Transferred for consideration of CABG.  At baseline fairly active and independent. Non-smoker. RHC with elevated filling pressures and low CO.   On exam  General:  Elderly woman. Very entally clear. No resp difficulty HEENT: normal Neck: supple. JVP 9. Carotids 2+ bilat; no bruits. No lymphadenopathy or thryomegaly appreciated. Cor: PMI nondisplaced. Regular rate & rhythm. No rubs, gallops or murmurs. Lungs: clear Abdomen: soft, nontender, nondistended. No hepatosplenomegaly. No bruits or masses. Good bowel sounds. Extremities: no cyanosis, clubbing, rash, edema Neuro: alert & orientedx3, cranial nerves grossly intact. moves all 4 extremities w/o difficulty. Affect pleasant  I have reviewed her echo and cath films personally. She has EF 30-35% with severe 3v CAD with 95% dLM and occluded RCA. RHC numbers marginal. Agree with need for CABG. Await TCTS eval. She needs more diurese. Adjust HF meds as above. Consider PICC to further optimize depending on timing of surgery. Low threshold to restart heparin for any recurrent CP.   Arvilla Meres, MD  9:59 PM

## 2019-07-17 NOTE — Progress Notes (Addendum)
Patient arrived from [facility] to 4e25, patient placed on monitor and CCMD made aware, bp 157/91 heart rate sinus rhythm 103 on monitor. Sats 98 percent on room air. Patient cleansed with CHG wipes.  Patient with blanchable redness to bottom. ON call NPC made aware through [place] system that patient had arrived from [facility].

## 2019-07-17 NOTE — Consult Note (Signed)
TCTS Consult Pt is seen and examined in presence of her daughter; full report to follow. Very pleasant 85 yo lady who is presently independent. Very tight LM CAD. Will complete work-up prior to finalizing recommendation for CABG.  Lacey Coleman Seen, [place]

## 2019-07-17 NOTE — H&P (Signed)
Cardiology Admission History and Physical:   Patient ID: Lacey Coleman MRN: [id number]; DOB: 1931-07-20   Admission date: (Not on file)  Primary Care Provider: Patient, No Pcp Per Primary Cardiologist:[facility], Dr. Fletcher Anon rounding Primary Electrophysiologist:  None   Chief Complaint:  3v CAD with need for CABG  Patient Profile:   Lacey Coleman is a 85 y.o. female with history of 3 vessel CAD by recent 07/17/2019 cardiac catheterization, HFpEF (EF 30-35%), HTN, anemia, hypokalemia, and rheumatoid arthritis pending transfer to [facility] from [facility].  History of Present Illness:   Ms. Lacey Coleman is an 85 year old female with no previous cardiac history known prior to her current admission. She had a known history of rheumatoid arthritis and no regular PCP. She had reportedly not seen a physician in over 50 years.     On 07/16/2019, she presented to [facility] with progressive shortness of breath x3 weeks.  She also noted progressive orthopnea and PND, as well as bilateral lower extremity edema.  She noted occasional chest pain.  In the ED, she required BiPAP and was admitted for further management.  She was admitted for further management and evaluation.  Echocardiogram was completed as below and showed EF 30 to 35% with diffuse hypokinesis and was unable to exclude wall motion abnormalities.  She underwent aggressive diuresis overnight and underwent left and right heart cardiac catheterization on 07/17/2019.  Cath findings as below showed moderate LV systolic dysfunction, LVEDP moderately elevated, distal LM lesion 90% stenosed, proximal circumflex 80% stenosis, proximal LAD to mid LAD 90% stenosed, first diagonal lesion 80% stenosed, mid circumflex 100% stenosed, proximal RCA 90% stenosed, and proximal RCA to mid RCA 100% stenosed.  It was was observed that the coronary arteries were overall moderately to severely calcified.  Due to the observed  severe three-vessel and left main coronary artery disease, recommendation was for transfer to [facility] for formal CABG evaluation.  It was noted that left ventricular angiogram was not optimal due to PVCs and poor filling with echo performed before cath showing EF 30-35%.  Right heart catheterization showed moderately elevated filling pressures with mean wedge pressure of 25 mmHg, moderate pulmonary hypertension at 50/24 mmHg, and moderate severely reduced cardiac output at 3.41 with cardiac index of 1.95.  While still at [facility], the patient received 1 dose of IV furosemide, given her breathing difficulty and right heart cath findings.  Recommendation was to continue gentle diuresis and treatment for heart failure and coronary artery disease.  It was also recommended to resume heparin drip 8 hours after sheath pull.   In preparation for transfer, [facility] contacted for transfer for CABG evaluation.  IM informed of transfer and will complete EMTALA form.  Heart Pathway Score:     Past Medical History:  Diagnosis Date   CAD (coronary artery disease)    9/14 LHC with EF 25-35%, mod elevated LVEDP, dLM 90%, pLCx80%, pLAD to mLAD 90%, 1st diag 80%, mid Cx 100%, pRCA 90%, pRCA to mRCA 100%   Heart failure with reduced ejection fraction (HCC)    a) 9/13 echo with EF 30-35%, mild LVF, diffuse hypokinesis, mod aortic calcification, mild AR   Hypertension    Rheumatoid arthritis ([place])     No past surgical history on file.   Medications Prior to Admission: Prior to Admission medications   Medication Sig Start Date End Date Taking? Authorizing Provider  aspirin EC 325 MG  EC tablet Take 1 tablet (325 mg total) by mouth daily. 07/17/19   Katha Hamming, MD  atorvastatin (LIPITOR) 40 MG tablet Take 1 tablet (40 mg total) by mouth daily at 6 PM. 07/17/19   Katha Hamming, MD  carvedilol (COREG) 3.125 MG tablet Take 1 tablet (3.125 mg  total) by mouth 2 (two) times daily with a meal. 07/17/19   Katha Hamming, MD  lisinopril (ZESTRIL) 5 MG tablet Take 1 tablet (5 mg total) by mouth daily. 07/17/19   Katha Hamming, MD  nitroGLYCERIN (NITROSTAT) 0.4 MG SL tablet Place 1 tablet (0.4 mg total) under the tongue every 5 (five) minutes as needed for chest pain. 07/17/19   Katha Hamming, MD     Allergies:   No Known Allergies  Social History:   Social History   Socioeconomic History   Marital status: Single    Spouse name: Not on file   Number of children: Not on file   Years of education: Not on file   Highest education level: Not on file  Occupational History   Not on file  Social Needs   Financial resource strain: Not on file   Food insecurity    Worry: Not on file    Inability: Not on file   Transportation needs    Medical: Not on file    Non-medical: Not on file  Tobacco Use   Smoking status: Never Smoker   Smokeless tobacco: Never Used  Substance and Sexual Activity   Alcohol use: Not Currently    Frequency: Never   Drug use: Not Currently   Sexual activity: Not on file  Lifestyle   Physical activity    Days per week: Not on file    Minutes per session: Not on file   Stress: Not on file  Relationships   Social connections    Talks on phone: Not on file    Gets together: Not on file    Attends religious service: Not on file    Active member of club or organization: Not on file    Attends meetings of clubs or organizations: Not on file    Relationship status: Not on file   Intimate partner violence    Fear of current or ex partner: Not on file    Emotionally abused: Not on file    Physically abused: Not on file    Forced sexual activity: Not on file  Other Topics Concern   Not on file  Social History Narrative   Not on file    Family History:   The patient's family history is not on file.   No known family history of cardiac dz  ROS:  Please see the  history of present illness.  Earlier SOB. Refer to MD progress note.   Physical Exam/Data:  There were no vitals filed for this visit. No intake or output data in the 24 hours ending 07/17/19 1307 Last 3 Weights 07/17/2019 07/16/2019 07/16/2019  Weight (lbs) 150 lb 2.1 oz 155 lb 11.2 oz 150 lb  Weight (kg) 68.1 kg 70.625 kg 68.04 kg     There is no height or weight on file to calculate BMI.  Physical exam performed by MD as in progress note   EKG:  No new tracings  Relevant CV Studies: Echocardiogram on 9/13:  1. The left ventricle has moderate-severely reduced systolic function, with an ejection fraction of 30-35%. The cavity size was normal. There is mildly increased left ventricular wall thickness. Left ventricular diastolic Doppler  parameters are  consistent with impaired relaxation. Left ventricular diffuse hypokinesis.Unable to exclude regional wall motion abnormality. 2. The right ventricle has normal systolic function. The cavity was normal. There is no increase in right ventricular wall thickness.Unable to estimate RVSP. 3. Moderate calcification of the aortic valve. Aortic valve regurgitation is mild  [facility] 07/17/2019  The left ventricular ejection fraction is 25-35% by visual estimate.  There is moderate left ventricular systolic dysfunction.  LV end diastolic pressure is moderately elevated.  Dist LM lesion is 90% stenosed.  Prox Cx lesion is 80% stenosed.  Prox LAD to Mid LAD lesion is 90% stenosed.  1st Diag lesion is 80% stenosed.  Mid Cx lesion is 100% stenosed.  Prox RCA lesion is 90% stenosed.  Prox RCA to Mid RCA lesion is 100% stenosed.   1.  Severe three-vessel and left main coronary artery disease.  The coronary arteries are overall moderately to severely calcified. 2.  Moderately to severely reduced LV systolic function with an EF of 30 to 35%.  Left ventricular angiogram was not optimal due to PVCs and poor filling the an echo was done before  the cath. 3.  Right heart catheterization showed moderately elevated filling pressures with mean wedge pressure of 25 mmHg, moderate pulmonary hypertension at 50/24 mmHg and moderately severely reduced cardiac output at 3.41 with a cardiac index of 1.95.  Recommendations: The patient was given 1 dose of IV furosemide during catheterization given some difficulty breathing and right heart cath findings.  Continue gentle diuresis and treatment for heart failure and coronary artery disease. Resume heparin drip 8 hours after sheath pull. Transfer to [facility] for CABG evaluation.  Wall Motion  Resting                 Coronary Diagrams  Diagnostic Dominance: Right  Intervention    Laboratory Data:  High Sensitivity Troponin:   Recent Labs  Lab 07/16/19 0141 07/16/19 0348 07/16/19 0617  TROPONINIHS 92* 193* 599*      Cardiac EnzymesNo results for input(s): TROPONINI in the last 168 hours. No results for input(s): TROPIPOC in the last 168 hours.  Chemistry Recent Labs  Lab 07/16/19 0141 07/17/19 0440  NA 136 139  K 4.1 3.4*  CL 104 104  CO2 19* 27  GLUCOSE 314* 104*  BUN 16 17  CREATININE 0.86 0.58  CALCIUM 9.6 9.5  GFRNONAA >60 >60  GFRAA >60 >60  ANIONGAP 13 8    Recent Labs  Lab 07/16/19 0141  PROT 6.4*  ALBUMIN 3.3*  AST 34  ALT 24  ALKPHOS 95  BILITOT 0.8   Hematology Recent Labs  Lab 07/16/19 0617 07/17/19 0440  WBC 12.0* 9.2  RBC 4.93 4.41  HGB 11.4* 10.3*  HCT 37.1 33.1*  MCV 75.3* 75.1*  MCH 23.1* 23.4*  MCHC 30.7 31.1  RDW 15.8* 15.9*  PLT 279 257   BNP Recent Labs  Lab 07/16/19 0141  BNP 2,027.0*    DDimer No results for input(s): DDIMER in the last 168 hours.   Radiology/Studies:  No results found.  Assessment and Plan:   Acute systolic heart failure (EF 30-35%) --Progressive SOB. S/p L/RHC as above with significant 3v CAD. Elevated right heart pressures with wedge 25 mmHg, moderate pulmonary hypertension at  50/24 mmHg and moderately severely reduced cardiac output at 3.41 with a cardiac index of 1.95. --Echo as above with EF 30-35%. BNP 2,027.0. --Transfer to [facility] for CABG evaluation.  --Continue gentle diuresis with IV  lasix 20mg  BID. Received first dose of IV lasix 20mg  while in cath lab today (9/14) due to SOB and right heart findings at time of cath.  --Daily BMET. Cr 0.58 at transfer with BUN 17.  Monitor I/O, daily weights.  --Continue lisinopril and Coreg. [facility], patient has yet to receive lisinopril or Coreg on 9/14 and prior to transfer.Addition of spironolactone as BP allows.[facility] if BP allows.    NSTEMI 3v CAD --[facility] Transfer set up though [facility] and EMTALA completed. Troponin elevation as above. Cath as above with severe 3v CAD.  Continue to cycle HS Tn until peaked and down-trending. No acute ST/T changes. --Heparin to start 8h s/p sheath pull today (9/14). Daily CBC and close monitoring given anemia below. Ordered AM BMET given gentle diuresis / hypokalemia. Pending lipid panel, Hgb A1C. LFTs stable. --NPO past midnight in preparation for CABG. Heart healthy carb modified diet until that time.  --Continue ASA 81mg , Coreg 3.125mg  BID, lisinopril 5mg  daily, atorvastatin 40mg  daily SL nitro as needed for CP. Spironolactone to be added as BP allows. Per review of MAR, patient has not yet received these medications today. --Follow-up in the [place] office.  Hypokalemia --K 3.4. Replete with goal 4.0. --Mg last 2.4 (9/13) with goal 2.0. --Daily BMET.  Anemia --Hgb 10.3. Closely monitor. AM CBC.   Essential Hypertension --Suspected underlying HTN.  --Continue medical management for BP control.  Severity of Illness: The appropriate patient status for this patient is INPATIENT. Inpatient status is judged to be reasonable and necessary in order to provide  the required intensity of service to ensure the patient's safety. The patient's presenting symptoms, physical exam findings, and initial radiographic and laboratory data in the context of their chronic comorbidities is felt to place them at high risk for further clinical deterioration. Furthermore, it is not anticipated that the patient will be medically stable for discharge from the hospital within 2 midnights of admission. The following factors support the patient status of inpatient.   " The patient's presenting symptoms include HFrEF, CAD. " The worrisome physical exam findings include HFrEF, CAD. " The initial radiographic and laboratory data are worrisome because of HFrEF, CAD " The chronic co-morbidities include HFrEF, CAD.   * I certify that at the point of admission it is my clinical judgment that the patient will require inpatient hospital care spanning beyond 2 midnights from the point of admission due to high intensity of service, high risk for further deterioration and high frequency of surveillance required.*    For questions or updates, please contact [facility] Please consult [email or web address] for contact info under        Signed, Lennon AlstromJacquelyn D Maysa Lynn, PA-C  07/17/2019 1:07 PM

## 2019-07-17 NOTE — Consult Note (Addendum)
ANTICOAGULATION CONSULT NOTE  Pharmacy Consult for Heparin infusion  Indication: chest pain/ACS/NSTEMI  No Known Allergies  Patient Measurements: Height: 5\' 5"  (165.1 cm) Weight: 155 lb 11.2 oz (70.6 kg) IBW/kg (Calculated) : 57  Vital Signs: Temp: 98.4 F (36.9 C) (09/14 0200) Temp Source: Oral (09/14 0200) BP: 115/68 (09/14 0500) Pulse Rate: 84 (09/14 0500)  Labs: Recent Labs    07/16/19 0141 07/16/19 0348 07/16/19 0617 07/16/19 1237 07/16/19 1909 07/17/19 0440  HGB 11.7*  --  11.4*  --   --  10.3*  HCT 39.5  --  37.1  --   --  33.1*  PLT 296  --  279  --   --  257  APTT <24*  --   --   --   --   --   LABPROT 15.2  --   --   --   --   --   INR 1.2  --   --   --   --   --   HEPARINUNFRC  --   --   --  0.17* 0.32 0.20*  CREATININE 0.86  --   --   --   --   --   TROPONINIHS 92* 193* 599*  --   --   --     Estimated Creatinine Clearance: 46.3 mL/min (by C-G formula based on SCr of 0.86 mg/dL).   Medical History: Past Medical History:  Diagnosis Date  . Rheumatoid arthritis [facility])     Assessment: Pharmacy consulted for heparin drip management for 85 yo female admitted with new onset CHF, suspected ACS/NSTEMI, and hypertensive urgency. Patient not on anticoagulants as an outpatient. Heparin infusing at 800 units/hr.   9/13 1237 0.17 @ 800 units/hr 9/13 1909 0.32 @ 950 units/hr therapeutic x 1  Goal of Therapy:  Heparin level 0.3-0.7 units/ml Monitor platelets by anticoagulation protocol: Yes   Plan:  9/14 @ 0440 HL: 0.20. Level is subtherapeutic. Confirmed with RN that infusion has been running without interruption.  Will order Heparin 1000 unit bolus and increase infusion rate to 1100 units/hr. Recheck heparin level 6 hours after rate change.  CBC with am labs per protocol.   Pharmacy will continue to monitor and adjust per consult.   Pernell Dupre, PharmD, BCPS Clinical Pharmacist 07/17/2019 6:02 AM

## 2019-07-17 NOTE — Progress Notes (Signed)
Patient arrived back on unit from cath lab. Pt is alert and oriented. VSS. Pt waiting for transfer to [facility].

## 2019-07-17 NOTE — Progress Notes (Signed)
ANTICOAGULATION CONSULT NOTE - Initial Consult  Pharmacy Consult for heparin  Indication: atrial fibrillation  No Known Allergies  Patient Measurements: Height: 5\' 6"  (167.6 cm) Weight: 138 lb (62.6 kg) IBW/kg (Calculated) : 59.3   Vital Signs: Temp: 98 F (36.7 C) (09/14 2020) Temp Source: Oral (09/14 2020) BP: 108/79 (09/14 2020) Pulse Rate: 101 (09/14 2020)  Labs: Recent Labs    07/16/19 0141 07/16/19 0348 07/16/19 0617 07/16/19 1237 07/16/19 1909 07/17/19 0440 07/17/19 1641  HGB 11.7*  --  11.4*  --   --  10.3* 12.4  HCT 39.5  --  37.1  --   --  33.1* 38.2  PLT 296  --  279  --   --  257 279  APTT <24*  --   --   --   --   --   --   LABPROT 15.2  --   --   --   --   --   --   INR 1.2  --   --   --   --   --   --   HEPARINUNFRC  --   --   --  0.17* 0.32 0.20*  --   CREATININE 0.86  --   --   --   --  0.58 0.77  TROPONINIHS 92* 193* 599*  --   --   --   --     Estimated Creatinine Clearance: 47.3 mL/min (by C-G formula based on SCr of 0.77 mg/dL).   Medical History: Past Medical History:  Diagnosis Date  . CAD (coronary artery disease)    9/14 LHC with EF 25-35%, mod elevated LVEDP, dLM 90%, pLCx80%, pLAD to mLAD 90%, 1st diag 80%, mid Cx 100%, pRCA 90%, pRCA to mRCA 100%  . Heart failure with reduced ejection fraction ([place])    a) 9/13 echo with EF 30-35%, mild LVF, diffuse hypokinesis, mod aortic calcification, mild AR  . Hypertension   . Rheumatoid arthritis (HCC)     Medications:  Medications Prior to Admission  Medication Sig Dispense Refill Last Dose  . aspirin EC 325 MG EC tablet Take 1 tablet (325 mg total) by mouth daily. 30 tablet 0   . atorvastatin (LIPITOR) 40 MG tablet Take 1 tablet (40 mg total) by mouth daily at 6 PM. 30 tablet 0   . carvedilol (COREG) 3.125 MG tablet Take 1 tablet (3.125 mg total) by mouth 2 (two) times daily with a meal. 30 tablet 0   . lisinopril (ZESTRIL) 5 MG tablet Take 1 tablet (5 mg total) by mouth daily. 30 tablet  0   . nitroGLYCERIN (NITROSTAT) 0.4 MG SL tablet Place 1 tablet (0.4 mg total) under the tongue every 5 (five) minutes as needed for chest pain. 30 tablet 0     Assessment: 85 yo female with HF and  3VCAD for CABG evaluation.  She was noted with afib (CHADSVASC= 6) and pharmacy consulted to dose heparin.  -enoxaparin 40mg  given at 9:30pm  Goal of Therapy:  Heparin level 0.3-0.7 units/ml Monitor platelets by anticoagulation protocol: Yes   Plan:  -Start heparin at 850 units/hr -Heparin level in 8 hours and daily wth CBC daily  Hildred Laser, PharmD Clinical Pharmacist **Pharmacist phone directory can now be found on [email or web address] ([email or web address]).  Listed under [place].

## 2019-07-17 NOTE — Progress Notes (Signed)
Ch provided social support and words of comfort as the pt daughter just learned of the pt needing to be transported to another [facility] facility for a heart procedure. Pt daughter was under moderate emotional distress as she learned of the news. Ch provided a compassionate presence and joined daughter and pt at bedside who was alert and responsive upon daughter arrival.  No further needs at this time.    07/17/19 1100  Clinical Encounter Type  Visited With Health care provider;Patient and family together  Visit Type Psychological support;Spiritual support;Social support;Critical Care  Referral From Nurse  Consult/Referral To Chaplain  Spiritual Encounters  Spiritual Needs Emotional;Grief support  Stress Factors  Patient Stress Factors Health changes;Loss of control;Major life changes  Family Stress Factors Loss of control;Major life changes

## 2019-07-17 NOTE — Progress Notes (Signed)
Progress Note  Patient Name: Lacey Coleman Date of Encounter: 07/17/2019  Primary Cardiologist: No primary care provider on file.  New.  She was seen by Dr. Johney Frame and will establish in the [facility] office  Subjective   She reports feeling significantly better with less shortness of breath and orthopnea.  The patient has known history of rheumatoid arthritis but does not follow-up with any physician.  She reports that she has not seen a doctor in more than 50 years.  Inpatient Medications    Scheduled Meds: . aspirin EC  325 mg Oral Daily  . atorvastatin  40 mg Oral q1800  . Chlorhexidine Gluconate Cloth  6 each Topical Daily  . famotidine  20 mg Oral Daily  . furosemide  20 mg Intravenous Q12H  . insulin aspart  0-20 Units Subcutaneous TID WC  . lisinopril  5 mg Oral Daily  . mouth rinse  15 mL Mouth Rinse BID  . nitroGLYCERIN  1 inch Topical Q6H   Continuous Infusions: . sodium chloride    . sodium chloride    . sodium chloride 10 mL/hr at 07/17/19 0823  . heparin 1,100 Units/hr (07/17/19 0823)   PRN Meds: sodium chloride, sodium chloride, acetaminophen, hydrALAZINE, labetalol, morphine injection, nitroGLYCERIN, ondansetron (ZOFRAN) IV, sodium chloride flush, sodium chloride flush, zolpidem   Vital Signs    Vitals:   07/17/19 0400 07/17/19 0500 07/17/19 0700 07/17/19 0800  BP: 118/64 115/68 134/86 138/80  Pulse: 95 84 (!) 101 (!) 101  Resp: (!) 22 20 (!) 29 (!) 24  Temp:    98.6 F (37 C)  TempSrc:    Oral  SpO2: 96% 93% 96% 95%  Weight:      Height:        Intake/Output Summary (Last 24 hours) at 07/17/2019 0831 Last data filed at 07/17/2019 0823 Gross per 24 hour  Intake 497.13 ml  Output 2500 ml  Net -2002.87 ml   Last 3 Weights 07/16/2019 07/16/2019  Weight (lbs) 155 lb 11.2 oz 150 lb  Weight (kg) 70.625 kg 68.04 kg      Telemetry    Normal sinus rhythm- Personally Reviewed  ECG     - Personally Reviewed  Physical Exam   GEN: No acute  distress.   Neck: No JVD Cardiac: RRR, no murmurs, rubs, or gallops.  Respiratory: Clear to auscultation bilaterally. GI: Soft, nontender, non-distended  MS: No edema; No deformity. Neuro:  Nonfocal  Psych: Normal affect   Labs    High Sensitivity Troponin:   Recent Labs  Lab 07/16/19 0141 07/16/19 0348 07/16/19 0617  TROPONINIHS 92* 193* 599*      Chemistry Recent Labs  Lab 07/16/19 0141 07/17/19 0440  NA 136 139  K 4.1 3.4*  CL 104 104  CO2 19* 27  GLUCOSE 314* 104*  BUN 16 17  CREATININE 0.86 0.58  CALCIUM 9.6 9.5  PROT 6.4*  --   ALBUMIN 3.3*  --   AST 34  --   ALT 24  --   ALKPHOS 95  --   BILITOT 0.8  --   GFRNONAA >60 >60  GFRAA >60 >60  ANIONGAP 13 8     Hematology Recent Labs  Lab 07/16/19 0141 07/16/19 0617 07/17/19 0440  WBC 15.2* 12.0* 9.2  RBC 4.99 4.93 4.41  HGB 11.7* 11.4* 10.3*  HCT 39.5 37.1 33.1*  MCV 79.2* 75.3* 75.1*  MCH 23.4* 23.1* 23.4*  MCHC 29.6* 30.7 31.1  RDW 16.0* 15.8* 15.9*  PLT 296 279 257    BNP Recent Labs  Lab 07/16/19 0141  BNP 2,027.0*     DDimer No results for input(s): DDIMER in the last 168 hours.   Radiology    Dg Chest Portable 1 View  Result Date: 07/16/2019 CLINICAL DATA:  Shortness of breath EXAM: PORTABLE CHEST 1 VIEW COMPARISON:  None. FINDINGS: Small pleural effusions with bibasilar atelectasis. No other consolidation. No pulmonary edema or pneumothorax. Cardiomediastinal contours are normal. IMPRESSION: Small bilateral pleural effusions with bibasilar atelectasis. Electronically Signed   By: Ulyses Jarred M.D.   On: 07/16/2019 02:13    Cardiac Studies   Echocardiogram on 9/13:   1. The left ventricle has moderate-severely reduced systolic function, with an ejection fraction of 30-35%. The cavity size was normal. There is mildly increased left ventricular wall thickness. Left ventricular diastolic Doppler parameters are  consistent with impaired relaxation. Left ventricular diffuse  hypokinesis.Unable to exclude regional wall motion abnormality.  2. The right ventricle has normal systolic function. The cavity was normal. There is no increase in right ventricular wall thickness.Unable to estimate RVSP.  3. Moderate calcification of the aortic valve. Aortic valve regurgitation is mild  Patient Profile     85 y.o. female who has not seen a primary care physician in many years with reported history of rheumatoid arthritis who presented with shortness of breath and orthopnea and was found to have systolic heart failure.  Assessment & Plan    1.  Acute systolic heart failure: Exact etiology is unclear but we have to exclude ischemic cardiomyopathy given her age.  The patient is scheduled for a right and left cardiac catheterization today with possible PCI.  I discussed the procedure in details as well as risks and benefits. In the meantime, continue furosemide 20 mg intravenously twice daily.  And the dose can be adjusted based on results of right heart cath today. She is currently on small dose lisinopril.  I am going to add small dose carvedilol.  We will consider switching to [facility] but I doubt that her blood pressure will allow acutely.  Will also need spironolactone.  2.  Essential hypertension: The patient might have underlying hypertension.  Blood pressure is controlled.      For questions or updates, please contact [place] Please consult [email or web address] for contact info under        Signed, Kathlyn Sacramento, MD  07/17/2019, 8:31 AM

## 2019-07-17 NOTE — Progress Notes (Signed)
Pt taken to cath lab at this time. Consent signed. Pt is alert and oriented. VSS.

## 2019-07-17 NOTE — Progress Notes (Signed)
Pt transferred to [facility] at this time. VSS prior to transfer. [facility] is transporting. Report given to RN at [facility]. Cardiologist is talking to family at this time about needed CABG.

## 2019-07-17 NOTE — Discharge Summary (Signed)
Lacey Coleman, is a 85 y.o. female  DOB 10-01-1931  MRN [id number].  Admission date:  07/16/2019  Admitting Physician  Christel Mormon, MD  Discharge Date:  07/18/2019   Primary MD  Patient, No Pcp Per  Recommendations for primary care physician for things to follow:  Transfer to [facility] for CABG. Patient needs stepdown unit  Admission Diagnosis  Elevated troponin [R79.89] Acute respiratory failure with hypoxemia (HCC) [J96.01] Acute heart failure, unspecified heart failure type ([place]) [I50.9] Acute respiratory failure (HCC) [J96.00]   Discharge Diagnosis  Elevated troponin [R79.89] Acute respiratory failure with hypoxemia (HCC) [J96.01] Acute heart failure, unspecified heart failure type ([place]) [I50.9] Acute respiratory failure (HCC) [J96.00]   Active Problems:   Acute systolic heart failure (HCC)   Acute respiratory failure (HCC)   Acute heart failure (HCC)   Non-ST elevation (NSTEMI) myocardial infarction [facility])      Past Medical History:  Diagnosis Date  . CAD (coronary artery disease)    9/14 LHC with EF 25-35%, mod elevated LVEDP, dLM 90%, pLCx80%, pLAD to mLAD 90%, 1st diag 80%, mid Cx 100%, pRCA 90%, pRCA to mRCA 100%  . Heart failure with reduced ejection fraction ([place])    a) 9/13 echo with EF 30-35%, mild LVF, diffuse hypokinesis, mod aortic calcification, mild AR  . Hypertension   . Rheumatoid arthritis [facility])     Past Surgical History:  Procedure Laterality Date  . RIGHT/LEFT HEART CATH AND CORONARY ANGIOGRAPHY N/A 07/17/2019   Procedure: RIGHT/LEFT HEART CATH AND CORONARY ANGIOGRAPHY;  Surgeon: Wellington Hampshire, MD;  Location: [facility];  Service: Cardiovascular;  Laterality: N/A;  . TONSILLECTOMY         History of present illness and  Hospital Course:     Kindly see H&P for  history of present illness and admission details, please review complete Labs, Consult reports and Test reports for all details in brief  HPI  from the history and physical done on the day of admission 85 year old female with no past medical history and did not see any doctor for the past 50 years comes in because of exertional dyspnea, pedal edema, admitted to ICU for acute respiratory failure due to CHF.   Hospital Course  Acute respiratory failure secondary to congestive heart failure, initially required BiPAP, admitted to ICU, patient came off the BiPAP, now on the room air and saturation is more than 90%. 2.  New onset systolic heart failure, EF is around 25 to 35%.  The patient received IV Lasix to help with her shortness of breath, patient able to come off the BiPAP.  Patient went for cardiac cath, cardiac cath was done by Dr. Fletcher Anon, it showed severe three-vessel disease /  The left ventricular ejection fraction is 25-35% by visual estimate.  There is moderate left ventricular systolic dysfunction.  LV end diastolic pressure is moderately elevated.  Dist LM lesion is 90% stenosed.  Prox Cx lesion is 80% stenosed.  Prox LAD to Mid LAD lesion is 90% stenosed.  1st Diag lesion is 80% stenosed.  Mid Cx lesion is 100% stenosed.  Prox RCA lesion is 90% stenosed.  Prox RCA to Mid RCA lesion is 100% stenosed. Patient also has elevated right heart pressures w, elevated wedge pressure, decreased cardiac index.  Of 1.95.  Cardiology is recommending transfer to [facility] on the stepdown unit, Dr. Fletcher Anon spoke to transfer center coordinator. Cardiology recommends to continue heparin drip 8 hours after sheath  pull, patient also received 1 dose of IV Lasix during the cardiac cath due to shortness of breath. Discharge Condition: Stable   Follow UP      Discharge Instructions  and  Discharge Medications    Continue meds as per [facility]   Allergies as of 07/17/2019   No  Known Allergies     Medication List    TAKE these medications   aspirin 325 MG EC tablet Take 1 tablet (325 mg total) by mouth daily.   atorvastatin 40 MG tablet Commonly known as: LIPITOR Take 1 tablet (40 mg total) by mouth daily at 6 PM.   carvedilol 3.125 MG tablet Commonly known as: COREG Take 1 tablet (3.125 mg total) by mouth 2 (two) times daily with a meal.   lisinopril 5 MG tablet Commonly known as: ZESTRIL Take 1 tablet (5 mg total) by mouth daily.   nitroGLYCERIN 0.4 MG SL tablet Commonly known as: NITROSTAT Place 1 tablet (0.4 mg total) under the tongue every 5 (five) minutes as needed for chest pain.         Diet and Activity recommendation: See Discharge Instructions above   Consults obtained -cardiology   Major procedures and Radiology Reports - PLEASE review detailed and final reports for all details, in brief -      Ct Chest Wo Contrast  Result Date: 07/17/2019 CLINICAL DATA:  CT chest without, nontraumatic aortic disease EXAM: CT CHEST WITHOUT CONTRAST TECHNIQUE: Multidetector CT imaging of the chest was performed following the standard protocol without IV contrast. COMPARISON:  Chest radiograph 07/16/2019 FINDINGS: Cardiovascular: Evaluation of the aortic lumen is limited in the absence of contrast administration. The aortic root is suboptimally assessed given cardiac pulsation artifact. Extensive leaflet calcifications are no upon the aortic valve. Borderline dilatation of the ascending thoracic aorta to 4.0 cm. Aorta returns to normal caliber by the level of the distal arch measuring 2.7 cm just distal to the left subclavian artery. Descending thoracic aorta measures 2.3 cm. Atheromatous plaque is present throughout the aorta. The aorta is uncoiled and slightly tortuous. Mild cardiomegaly with left atrial enlargement. There is extensive calcified three-vessel coronary artery disease. Trace pericardial effusion. Central pulmonary arterial enlargement is  present. Mediastinum/Nodes: Heterogeneously attenuating 3.9 cm lesion arising from the left lobe thyroid. Right lobe thyroid gland is somewhat nodular but less pronounced. A partially hyperattenuating 1.8 cm lesion arises from the pyramidal lobe of the thyroid. Several calcified AP window, subcarinal and paratracheal lymph nodes are present. Hilar adenopathy is poorly assessed in the absence of contrast media. The trachea is grossly normal. Small sliding-type hiatal hernia. Lungs/Pleura: There are moderate bilateral pleural effusions. Adjacent areas of passive atelectasis are present with subtotal collapse of both lower lobes. Mild airways thickening. Patchy area of peribronchovascular ground-glass is noted lingula (3/63). Few calcified granulomas are noted throughout the lungs. Additional dependent atelectasis is seen posteriorly. Upper Abdomen: Sense of calcification of the upper abdominal aorta and splenic artery. Indeterminate heterogeneously attenuating lesion arising from the upper pole left kidney measuring up 3 cm. Musculoskeletal: Multilevel severe degenerative changes are present in the imaged portions of the spine. Large Schmorl's node formation versus remote superior endplate compression deformity of T12. no suspicious osseous lesions. IMPRESSION: 1. Evaluation of the aortic lumen is limited in the absence of contrast administration. Borderline dilatation of the ascending thoracic aorta to 4.0 cm. Recommend annual imaging followup by CTA or MRA. This recommendation follows 2010 ACCF/AHA/AATS/ACR/ASA/SCA/SCAI/SIR/STS/SVM Guidelines for the Diagnosis and Management of Patients with Thoracic Aortic  Disease. Circulation. 2010; 121: W098-J191: E266-e369. Aortic aneurysm NOS (ICD10-I71.9) 2. Extensive calcified three-vessel coronary artery disease. 3. Cardiomegaly with left atrial enlargement. 4. Patchy area of peribronchovascular ground-glass is noted in the lingula, concerning for infectious/inflammatory process. 5.  Moderate bilateral pleural effusions with adjacent areas of passive atelectasis. 6. Large heterogeneously attenuating mass arising from the left lobe thyroid and hyperattenuating nodule arising from the pyramidal lobe. Recommend further evaluation dedicated thyroid ultrasound. This follows ACR consensus guidelines: Managing Incidental Thyroid Nodules Detected on Imaging: White Paper of the ACR Incidental Thyroid Findings Committee. J Am Coll Radiol 2015; 12:143-150. 7. Indeterminate heterogeneously attenuating lesion arising from the upper pole left kidney measuring up to 3 cm. Further evaluation with dedicated multiphase renal CT or MRI is recommended. Insert reference renal This recommendation follows ACR consensus guidelines: Management of the Incidental Renal Mass on CT: A White Paper of the ACR Incidental Findings Committee. J Am Coll Radiol [id number];15:264-273. 8. Aortic Atherosclerosis (ICD10-I70.0). Electronically Signed   By: Kreg ShropshirePrice  DeHay M.D.   On: 07/17/2019 21:48   Dg Chest Portable 1 View  Result Date: 07/16/2019 CLINICAL DATA:  Shortness of breath EXAM: PORTABLE CHEST 1 VIEW COMPARISON:  None. FINDINGS: Small pleural effusions with bibasilar atelectasis. No other consolidation. No pulmonary edema or pneumothorax. Cardiomediastinal contours are normal. IMPRESSION: Small bilateral pleural effusions with bibasilar atelectasis. Electronically Signed   By: Deatra RobinsonKevin  Herman M.D.   On: 07/16/2019 02:13   [place]as [place] Doppler Pre Cabg  Result Date: 07/18/2019 PREOPERATIVE VASCULAR EVALUATION  Indications:      Pre CABG. Risk Factors:     Diabetes. Comparison Study: no prior Performing Technologist: Jeb LeveringParker, Jill Rvt, Rdms  Examination Guidelines: A complete evaluation includes B-mode imaging, spectral Doppler, color Doppler, and power Doppler as needed of all accessible portions of each vessel. Bilateral testing is considered an integral part of a complete examination. Limited examinations for reoccurring  indications may be performed as noted.  Right Carotid Findings: +----------+--------+--------+--------+------------+--------+           PSV cm/sEDV cm/sStenosisDescribe    Comments +----------+--------+--------+--------+------------+--------+ CCA Prox  78      7                                    +----------+--------+--------+--------+------------+--------+ CCA Distal52      11                                   +----------+--------+--------+--------+------------+--------+ ICA Prox  62      15      1-39%   heterogenous         +----------+--------+--------+--------+------------+--------+ ICA Distal86      16                                   +----------+--------+--------+--------+------------+--------+ ECA       66      2                                    +----------+--------+--------+--------+------------+--------+  +----------+--------+-------+----------------+------------+           PSV cm/sEDV cmsDescribe        Arm Pressure +----------+--------+-------+----------------+------------+ [id number]  Multiphasic, [id number]          +----------+--------+-------+----------------+------------+ +---------+--------+--+--------+--+---------+ VertebralPSV cm/s70EDV cm/s12Antegrade +---------+--------+--+--------+--+---------+ Left Carotid Findings: +----------+--------+--------+--------+------------+--------+           PSV cm/sEDV cm/sStenosisDescribe    Comments +----------+--------+--------+--------+------------+--------+ CCA Prox  89      10                                   +----------+--------+--------+--------+------------+--------+ CCA Distal59      5                                    +----------+--------+--------+--------+------------+--------+ ICA Prox  74      18      1-39%   heterogenous         +----------+--------+--------+--------+------------+--------+ ICA Distal68      17                                    +----------+--------+--------+--------+------------+--------+ ECA       149     3                                    +----------+--------+--------+--------+------------+--------+ +----------+--------+--------+----------------+------------+ SubclavianPSV cm/sEDV cm/sDescribe        Arm Pressure +----------+--------+--------+----------------+------------+           124             Multiphasic, WNL             +----------+--------+--------+----------------+------------+ +---------+--------+--+--------+--+---------+ VertebralPSV cm/s54EDV cm/s17Antegrade +---------+--------+--+--------+--+---------+ INCIDENTAL FINDING: enlarged, hyperechoic left thyroid ABI Findings: +--------+------------------+-----+----------+--------+ Right   Rt Pressure (mmHg)IndexWaveform  Comment  +--------+------------------+-----+----------+--------+ [id number]                    triphasic          +--------+------------------+-----+----------+--------+ ATA     108               0.84 monophasic         +--------+------------------+-----+----------+--------+ PTA     97                0.75 monophasic         +--------+------------------+-----+----------+--------+ +--------+------------------+-----+----------+-------------------------+ Left    Lt Pressure (mmHg)IndexWaveform  Comment                   +--------+------------------+-----+----------+-------------------------+ Brachial                       biphasic  BP not obtained due to IV +--------+------------------+-----+----------+-------------------------+ ATA     88                0.68 biphasic                            +--------+------------------+-----+----------+-------------------------+ PTA     109               0.84 monophasic                          +--------+------------------+-----+----------+-------------------------+  Right Doppler Findings: +--------+--------+-----+---------+--------+ Site     PressureIndexDoppler  Comments +--------+--------+-----+---------+--------+ [id number]  triphasic         +--------+--------+-----+---------+--------+ Radial               triphasic         +--------+--------+-----+---------+--------+ Ulnar                triphasic         +--------+--------+-----+---------+--------+  Left Doppler Findings: +--------+--------+-----+---------+-------------------------+ Site    PressureIndexDoppler  Comments                  +--------+--------+-----+---------+-------------------------+ Brachial             biphasic BP not obtained due to IV +--------+--------+-----+---------+-------------------------+ Radial               triphasic                          +--------+--------+-----+---------+-------------------------+ Ulnar                triphasic                          +--------+--------+-----+---------+-------------------------+  Summary: Right Carotid: Velocities in the right ICA are consistent with a 1-39% stenosis. Left Carotid: Velocities in the left ICA are consistent with a 1-39% stenosis. Right ABI: Resting right ankle-brachial index indicates mild right lower extremity arterial disease. Left ABI: Resting left ankle-brachial index indicates mild left lower extremity arterial disease. Right Upper Extremity: Doppler waveforms remain within normal limits with right radial compression. Doppler waveforms remain within normal limits with right ulnar compression. Left Upper Extremity: Doppler waveforms decrease >50% with left radial compression. Doppler waveforms remain within normal limits with left ulnar compression.     Preliminary     Micro Results    Recent Results (from the past 240 hour(s))  SARS Coronavirus 2 [facility](Hospital order, [facility] Nasopharyngeal Nasopharyngeal Swab     Status: None   Collection Time: 07/16/19  1:40 AM   Specimen: Nasopharyngeal Swab  Result Value Ref Range  Status   SARS Coronavirus 2 NEGATIVE NEGATIVE Final    Comment: (NOTE) If result is NEGATIVE SARS-CoV-2 target nucleic acids are NOT DETECTED. The SARS-CoV-2 RNA is generally detectable in upper and lower  respiratory specimens during the acute phase of infection. The lowest  concentration of SARS-CoV-2 viral copies this assay can detect is 250  copies / mL. A negative result does not preclude SARS-CoV-2 infection  and should not be used as the sole basis for treatment or other  patient management decisions.  A negative result may occur with  improper specimen collection / handling, submission of specimen other  than nasopharyngeal swab, presence of viral mutation(s) within the  areas targeted by this assay, and inadequate number of viral copies  (<250 copies / mL). A negative result must be combined with clinical  observations, patient history, and epidemiological information. If result is POSITIVE SARS-CoV-2 target nucleic acids are DETECTED. The SARS-CoV-2 RNA is generally detectable in upper and lower  respiratory specimens dur ing the acute phase of infection.  Positive  results are indicative of active infection with SARS-CoV-2.  Clinical  correlation with patient history and other diagnostic information is  necessary to determine patient infection status.  Positive results do  not rule out bacterial infection or co-infection with other viruses. If result is PRESUMPTIVE POSTIVE SARS-CoV-2 nucleic acids MAY BE PRESENT.   A presumptive positive result was obtained on the submitted  specimen  and confirmed on repeat testing.  While 2019 novel coronavirus  (SARS-CoV-2) nucleic acids may be present in the submitted sample  additional confirmatory testing may be necessary for epidemiological  and / or clinical management purposes  to differentiate between  SARS-CoV-2 and other Sarbecovirus currently known to infect humans.  If clinically indicated additional testing with an alternate  test  methodology [phone]) is advised. The SARS-CoV-2 RNA is generally  detectable in upper and lower respiratory sp ecimens during the acute  phase of infection. The expected result is Negative. Fact Sheet for Patients:  BoilerBrush.com.cy Fact Sheet for Healthcare Providers: https://pope.com/ This test is not yet approved or cleared by the [place] FDA and has been authorized for detection and/or diagnosis of SARS-CoV-2 by FDA under an Emergency Use Authorization (EUA).  This EUA will remain in effect (meaning this test can be used) for the duration of the COVID-19 declaration under Section 564(b)(1) of the Act, 21 U.S.C. section 360bbb-3(b)(1), unless the authorization is terminated or revoked sooner. Performed at [facility], [address] [place], [place] [address]   Urine Culture     Status: Abnormal (Preliminary result)   Collection Time: 07/16/19  2:40 AM   Specimen: Urine, Random  Result Value Ref Range Status   Specimen Description   Final    URINE, RANDOM Performed at [facility], [address], [place], [place] [address]    Special Requests   Final    Normal Performed at [facility], [address] [place], [place] [address]    Culture (A)  Final    >=100,000 COLONIES/mL ESCHERICHIA COLI SUSCEPTIBILITIES TO FOLLOW Performed at [facility], [address], [place], [place] [address]    Report Status PENDING  Incomplete  MRSA PCR Screening     Status: None   Collection Time: 07/16/19 10:56 AM   Specimen: Nasal Mucosa; Nasopharyngeal  Result Value Ref Range Status   MRSA by PCR NEGATIVE NEGATIVE Final    Comment:        The GeneXpert MRSA Assay (FDA approved for NASAL specimens only), is one component of a comprehensive MRSA colonization surveillance program. It is not intended to diagnose MRSA infection nor to guide or monitor treatment for MRSA  infections. Performed at [facility], [address], [place], [place] [address]        Today   Subjective:   Nealy Hickmon is stable for transfer to [facility] when the arrangements are made.  Objective:   Blood pressure (!) 142/92, pulse (!) 103, temperature 98.4 F (36.9 C), temperature source Oral, resp. rate 19, height  (1.651 m), weight 68.1 kg, SpO2 95 %.   Intake/Output Summary (Last 24 hours) at 07/18/2019 1305 Last data filed at 07/17/2019 1352 Gross per 24 hour  Intake 13.41 ml  Output 700 ml  Net -686.59 ml    Exam Awake Alert, Oriented x 3, No new F.N deficits, Normal affect Esmeralda.AT,PERRAL Supple Neck,No JVD, No cervical lymphadenopathy appriciated.  Symmetrical Chest wall movement, Good air movement bilaterally, CTAB RRR,No Gallops,Rubs or new Murmurs, No Parasternal Heave +ve B.Sounds, Abd Soft, Non tender, No organomegaly appriciated, No rebound -guarding or rigidity. Leg edema present.  Data Review   CBC w Diff:  Lab Results  Component Value Date   WBC 6.6 07/18/2019   HGB 10.5 (L) 07/18/2019   HCT 34.6 (L) 07/18/2019   PLT 253 07/18/2019   LYMPHOPCT 22 07/17/2019   MONOPCT 8  07/17/2019   EOSPCT 0 07/17/2019   BASOPCT 0 07/17/2019    CMP:  Lab Results  Component Value Date   NA 138 07/18/2019   K 3.3 (L) 07/18/2019   CL 98 07/18/2019   CO2 28 07/18/2019   BUN 15 07/18/2019   CREATININE 0.65 07/18/2019   PROT 6.4 (L) 07/16/2019   ALBUMIN 3.3 (L) 07/16/2019   BILITOT 0.8 07/16/2019   ALKPHOS 95 07/16/2019   AST 34 07/16/2019   ALT 24 07/16/2019  .   Total Time in preparing paper work, data evaluation and todays exam - 35 minutes  Katha Hamming M.D on 07/18/2019 at 1:05 PM    Note: This dictation was prepared with Dragon dictation along with smaller phrase technology. Any transcriptional errors that result from this process are unintentional.

## 2019-07-18 ENCOUNTER — Inpatient Hospital Stay (HOSPITAL_COMMUNITY): Payer: Medicare Other

## 2019-07-18 DIAGNOSIS — Z01810 Encounter for preprocedural cardiovascular examination: Secondary | ICD-10-CM

## 2019-07-18 DIAGNOSIS — I25110 Atherosclerotic heart disease of native coronary artery with unstable angina pectoris: Secondary | ICD-10-CM

## 2019-07-18 DIAGNOSIS — I214 Non-ST elevation (NSTEMI) myocardial infarction: Secondary | ICD-10-CM

## 2019-07-18 LAB — CBC
HCT: 34.6 % — ABNORMAL LOW (ref 36.0–46.0)
Hemoglobin: 10.5 g/dL — ABNORMAL LOW (ref 12.0–15.0)
MCH: 23.3 pg — ABNORMAL LOW (ref 26.0–34.0)
MCHC: 30.3 g/dL (ref 30.0–36.0)
MCV: 76.7 fL — ABNORMAL LOW (ref 80.0–100.0)
Platelets: 253 10*3/uL (ref 150–400)
RBC: 4.51 MIL/uL (ref 3.87–5.11)
RDW: 15.7 % — ABNORMAL HIGH (ref 11.5–15.5)
WBC: 6.6 10*3/uL (ref 4.0–10.5)
nRBC: 0 % (ref 0.0–0.2)

## 2019-07-18 LAB — FERRITIN: Ferritin: 25 ng/mL (ref 11–307)

## 2019-07-18 LAB — RETICULOCYTES
Immature Retic Fract: 12.4 % (ref 2.3–15.9)
RBC.: 4.51 MIL/uL (ref 3.87–5.11)
Retic Count, Absolute: 68.6 10*3/uL (ref 19.0–186.0)
Retic Ct Pct: 1.5 % (ref 0.4–3.1)

## 2019-07-18 LAB — HEMOGLOBIN A1C
Hgb A1c MFr Bld: 5.5 % (ref 4.8–5.6)
Mean Plasma Glucose: 111 mg/dL

## 2019-07-18 LAB — IRON AND TIBC
Iron: 18 ug/dL — ABNORMAL LOW (ref 28–170)
Saturation Ratios: 5 % — ABNORMAL LOW (ref 10.4–31.8)
TIBC: 363 ug/dL (ref 250–450)
UIBC: 345 ug/dL

## 2019-07-18 LAB — ABO/RH: ABO/RH(D): O POS

## 2019-07-18 LAB — BASIC METABOLIC PANEL
Anion gap: 12 (ref 5–15)
BUN: 15 mg/dL (ref 8–23)
CO2: 28 mmol/L (ref 22–32)
Calcium: 9.5 mg/dL (ref 8.9–10.3)
Chloride: 98 mmol/L (ref 98–111)
Creatinine, Ser: 0.65 mg/dL (ref 0.44–1.00)
GFR calc Af Amer: >60 mL/min (ref >60)
GFR calc non Af Amer: >60 mL/min (ref >60)
Glucose, Bld: 106 mg/dL — ABNORMAL HIGH (ref 70–99)
Potassium: 3.3 mmol/L — ABNORMAL LOW (ref 3.5–5.1)
Sodium: 138 mmol/L (ref 135–145)

## 2019-07-18 LAB — C-PEPTIDE: C-Peptide: 10.4 ng/mL — ABNORMAL HIGH (ref 1.1–4.4)

## 2019-07-18 LAB — HEPARIN LEVEL (UNFRACTIONATED): Heparin Unfractionated: 0.38 IU/mL (ref 0.30–0.70)

## 2019-07-18 LAB — VITAMIN B12: Vitamin B-12: 837 pg/mL (ref 180–914)

## 2019-07-18 LAB — FOLATE: Folate: 6.8 ng/mL (ref >5.9)

## 2019-07-18 MED ORDER — TRANEXAMIC ACID (OHS) PUMP PRIME SOLUTION
2.0000 mg/kg | INTRAVENOUS | Status: DC
  Filled 2019-07-18: qty 1.23

## 2019-07-18 MED ORDER — PHENYLEPHRINE HCL-NACL 20-0.9 MG/250ML-% IV SOLN
30.0000 ug/min | INTRAVENOUS | Status: AC
  Administered 2019-07-19: 09:00:00 40 ug/min via INTRAVENOUS
  Filled 2019-07-18: qty 250

## 2019-07-18 MED ORDER — SODIUM CHLORIDE 0.9 % IV SOLN
750.0000 mg | INTRAVENOUS | Status: AC
  Administered 2019-07-19: 750 mg via INTRAVENOUS
  Filled 2019-07-18: qty 750

## 2019-07-18 MED ORDER — VANCOMYCIN HCL 1000 MG IV SOLR
INTRAVENOUS | Status: DC
  Filled 2019-07-18: qty 1000

## 2019-07-18 MED ORDER — METOPROLOL TARTRATE 12.5 MG HALF TABLET
12.5000 mg | ORAL_TABLET | Freq: Once | ORAL | Status: AC
  Administered 2019-07-19: 12.5 mg via ORAL
  Filled 2019-07-18: qty 1

## 2019-07-18 MED ORDER — EPINEPHRINE HCL 5 MG/250ML IV SOLN IN NS
0.0000 ug/min | INTRAVENOUS | Status: AC
  Administered 2019-07-19: 2 ug/min via INTRAVENOUS
  Filled 2019-07-18: qty 250

## 2019-07-18 MED ORDER — TRANEXAMIC ACID 1000 MG/10ML IV SOLN
1.5000 mg/kg/h | INTRAVENOUS | Status: AC
  Administered 2019-07-19: 1.5 mg/kg/h via INTRAVENOUS
  Filled 2019-07-18: qty 25

## 2019-07-18 MED ORDER — POTASSIUM CHLORIDE CRYS ER 20 MEQ PO TBCR
40.0000 meq | EXTENDED_RELEASE_TABLET | Freq: Once | ORAL | Status: AC
  Administered 2019-07-18: 18:00:00 40 meq via ORAL
  Filled 2019-07-18: qty 2

## 2019-07-18 MED ORDER — POTASSIUM CHLORIDE 2 MEQ/ML IV SOLN
80.0000 meq | INTRAVENOUS | Status: DC
  Filled 2019-07-18: qty 40

## 2019-07-18 MED ORDER — BISACODYL 5 MG PO TBEC: 5.0000 mg | DELAYED_RELEASE_TABLET | Freq: Once | ORAL | Status: DC

## 2019-07-18 MED ORDER — TRANEXAMIC ACID (OHS) BOLUS VIA INFUSION
15.0000 mg/kg | INTRAVENOUS | Status: AC
  Administered 2019-07-19: 924 mg via INTRAVENOUS
  Filled 2019-07-18: qty 924

## 2019-07-18 MED ORDER — TEMAZEPAM 15 MG PO CAPS: 15.0000 mg | ORAL_CAPSULE | Freq: Once | ORAL | Status: DC | PRN

## 2019-07-18 MED ORDER — SODIUM CHLORIDE 0.9 % IV SOLN
1.5000 g | INTRAVENOUS | Status: AC
  Administered 2019-07-19: 1.5 g via INTRAVENOUS
  Filled 2019-07-18: qty 1.5

## 2019-07-18 MED ORDER — DOPAMINE-DEXTROSE 3.2-5 MG/ML-% IV SOLN
0.0000 ug/kg/min | INTRAVENOUS | Status: DC
  Filled 2019-07-18: qty 250

## 2019-07-18 MED ORDER — MAGNESIUM SULFATE 50 % IJ SOLN
40.0000 meq | INTRAMUSCULAR | Status: DC
  Filled 2019-07-18: qty 9.85

## 2019-07-18 MED ORDER — CHLORHEXIDINE GLUCONATE 0.12 % MT SOLN
15.0000 mL | Freq: Once | OROMUCOSAL | Status: AC
  Administered 2019-07-19: 15 mL via OROMUCOSAL
  Filled 2019-07-18: qty 15

## 2019-07-18 MED ORDER — INSULIN REGULAR(HUMAN) IN NACL 100-0.9 UT/100ML-% IV SOLN
INTRAVENOUS | Status: AC
  Administered 2019-07-19: 1.1 [IU]/h via INTRAVENOUS
  Filled 2019-07-18: qty 100

## 2019-07-18 MED ORDER — CHLORHEXIDINE GLUCONATE CLOTH 2 % EX PADS
6.0000 | MEDICATED_PAD | Freq: Once | CUTANEOUS | Status: AC
  Administered 2019-07-19: 6 via TOPICAL

## 2019-07-18 MED ORDER — NITROGLYCERIN IN D5W 200-5 MCG/ML-% IV SOLN
2.0000 ug/min | INTRAVENOUS | Status: DC
  Filled 2019-07-18: qty 250

## 2019-07-18 MED ORDER — SODIUM CHLORIDE 0.9 % IV SOLN
INTRAVENOUS | Status: DC
  Filled 2019-07-18: qty 30

## 2019-07-18 MED ORDER — AMIODARONE HCL 200 MG PO TABS
200.0000 mg | ORAL_TABLET | Freq: Two times a day (BID) | ORAL | Status: DC
  Administered 2019-07-18 – 2019-07-20 (×3): 200 mg via ORAL
  Filled 2019-07-18 (×3): qty 1

## 2019-07-18 MED ORDER — DEXMEDETOMIDINE HCL IN NACL 400 MCG/100ML IV SOLN
0.1000 ug/kg/h | INTRAVENOUS | Status: AC
  Administered 2019-07-19: 11:00:00 .5 ug/kg/h via INTRAVENOUS
  Filled 2019-07-18: qty 100

## 2019-07-18 MED ORDER — VANCOMYCIN HCL 10 G IV SOLR
1250.0000 mg | INTRAVENOUS | Status: AC
  Administered 2019-07-19: 1250 mg via INTRAVENOUS
  Filled 2019-07-18: qty 1250

## 2019-07-18 MED ORDER — PLASMA-LYTE 148 IV SOLN
INTRAVENOUS | Status: DC
  Filled 2019-07-18: qty 2.5

## 2019-07-18 MED ORDER — CHLORHEXIDINE GLUCONATE CLOTH 2 % EX PADS
6.0000 | MEDICATED_PAD | Freq: Once | CUTANEOUS | Status: AC
  Administered 2019-07-18: 6 via TOPICAL

## 2019-07-18 MED ORDER — MILRINONE LACTATE IN DEXTROSE 20-5 MG/100ML-% IV SOLN
0.3000 ug/kg/min | INTRAVENOUS | Status: AC
  Administered 2019-07-19: .25 ug/kg/min via INTRAVENOUS
  Filled 2019-07-18: qty 100

## 2019-07-18 NOTE — Consult Note (Signed)
[address]       [place] [address]             [phone]        SHAMEL LAUWERS [facility] Medical Record #[id number] Date of Birth: 1931-03-04  Referring: No ref. provider found Primary Care: Patient, No Pcp Per Primary Cardiologist:No primary care provider on file.  Chief Complaint:   No chief complaint on file. Leg swelling and chest pain  History of Present Illness:     85 yo lady in excellent health until past couple of weeks until she has noted increased peripheral edema. This past week she began to experience increase chest tightness and shortness of breath. She presented to the ED with these complaints, where noted to have NSTEMI. Underwent LHC showing LM CAD. Referred to [facility] for cabg workup. She has had intermittent chest pain, in retrospect, for several years. She also has experienced chest fluttering on occasion.  Current Activity/ Functional Status: Patient is independent with mobility/ambulation, transfers, ADL's, IADL's.   Zubrod Score: At the time of surgery this patients most appropriate activity status/level should be described as: [x]     0    Normal activity, no symptoms []     1    Restricted in physical strenuous activity but ambulatory, able to do out light work []     2    Ambulatory and capable of self care, unable to do work activities, up and about                 more than 50%  Of the time                            []     3    Only limited self care, in bed greater than 50% of waking hours []     4    Completely disabled, no self care, confined to bed or chair []     5    Moribund  Past Medical History:  Diagnosis Date   CAD (coronary artery disease)    9/14 LHC with EF 25-35%, mod elevated LVEDP, dLM 90%, pLCx80%, pLAD to mLAD 90%, 1st diag 80%, mid Cx 100%, pRCA 90%, pRCA to mRCA 100%   Heart failure with reduced ejection fraction (HCC)    a) 9/13 echo with EF 30-35%, mild LVF, diffuse hypokinesis, mod aortic calcification, mild  AR   Hypertension    Rheumatoid arthritis (HCC)     Past Surgical History:  Procedure Laterality Date   RIGHT/LEFT HEART CATH AND CORONARY ANGIOGRAPHY N/A 07/17/2019   Procedure: RIGHT/LEFT HEART CATH AND CORONARY ANGIOGRAPHY;  Surgeon: Iran Ouch, MD;  Location: [facility] INVASIVE CV LAB;  Service: Cardiovascular;  Laterality: N/A;   TONSILLECTOMY      Social History   Tobacco Use  Smoking Status Never Smoker  Smokeless Tobacco Never Used    Social History   Substance and Sexual Activity  Alcohol Use Not Currently   Frequency: Never     No Known Allergies  Current Facility-Administered Medications  Medication Dose Route Frequency Provider Last Rate Last Dose   0.9 %  sodium chloride infusion  250 mL Intravenous PRN Clegg, Amy D, NP       acetaminophen (TYLENOL) tablet 650 mg  650 mg Oral Q4H PRN Clegg, Amy D, NP       amiodarone (PACERONE) tablet 200 mg  200 mg Oral BID Clegg, Amy  D, NP   200 mg at 07/18/19 1808   aspirin EC tablet 81 mg  81 mg Oral Daily Clegg, Amy D, NP   81 mg at 07/18/19 0941   atorvastatin (LIPITOR) tablet 40 mg  40 mg Oral q1800 Clegg, Amy D, NP   40 mg at 07/18/19 1808   bisacodyl (DULCOLAX) EC tablet 5 mg  5 mg Oral Once Linden Dolin, MD       [START ON 07/19/2019] cefUROXime (ZINACEF) 1.5 g in sodium chloride 0.9 % 100 mL IVPB  1.5 g Intravenous To OR Linden Dolin, MD       [START ON 07/19/2019] cefUROXime (ZINACEF) 750 mg in sodium chloride 0.9 % 100 mL IVPB  750 mg Intravenous To OR Linden Dolin, MD       [START ON 07/19/2019] chlorhexidine (PERIDEX) 0.12 % solution 15 mL  15 mL Mouth/Throat Once Jalilah Wiltsie, Merri Brunette, MD       Chlorhexidine Gluconate Cloth 2 % PADS 6 each  6 each Topical Once Clarine Elrod, Merri Brunette, MD       And   Chlorhexidine Gluconate Cloth 2 % PADS 6 each  6 each Topical Once Linden Dolin, MD       [START ON 07/19/2019] dexmedetomidine (PRECEDEX) 400 MCG/100ML (4 mcg/mL) infusion  0.1-0.7 mcg/kg/hr  Intravenous To OR Zyliah Schier, Merri Brunette, MD       digoxin (LANOXIN) tablet 0.125 mg  0.125 mg Oral Daily Clegg, Amy D, NP   0.125 mg at 07/18/19 0941   [START ON 07/19/2019] DOPamine (INTROPIN) 800 mg in dextrose 5 % 250 mL (3.2 mg/mL) infusion  0-10 mcg/kg/min Intravenous To OR Linden Dolin, MD       [START ON 07/19/2019] EPINEPHrine (ADRENALIN) 4 mg in NS 250 mL (0.016 mg/mL) premix infusion  0-10 mcg/min Intravenous To OR Linden Dolin, MD       [START ON 07/19/2019] heparin 2,500 Units, papaverine 30 mg in electrolyte-148 (PLASMALYTE-148) 500 mL irrigation   Irrigation To OR Naylene Foell, Merri Brunette, MD       [START ON 07/19/2019] heparin 30,000 units/NS 1000 mL solution for CELLSAVER   Other To OR Daiel Strohecker, Merri Brunette, MD       heparin ADULT infusion 100 units/mL (25000 units/241mL sodium chloride 0.45%)  850 Units/hr Intravenous Continuous Silvana Newness, RPH 8.5 mL/hr at 07/18/19 1500 850 Units/hr at 07/18/19 1500   [START ON 07/19/2019] insulin regular, human (MYXREDLIN) 100 units/ 100 mL infusion   Intravenous To OR Linden Dolin, MD       [START ON 07/19/2019] magnesium sulfate (IV Push/IM) injection 40 mEq  40 mEq Other To OR Angelique Chevalier, Merri Brunette, MD       metoprolol tartrate (LOPRESSOR) tablet 12.5 mg  12.5 mg Oral BID Clerance Lav, MD   12.5 mg at 07/18/19 0941   [START ON 07/19/2019] metoprolol tartrate (LOPRESSOR) tablet 12.5 mg  12.5 mg Oral Once Linden Dolin, MD       [START ON 07/19/2019] milrinone (PRIMACOR) 20 MG/100 ML (0.2 mg/mL) infusion  0.3 mcg/kg/min Intravenous To OR Brysan Mcevoy, Merri Brunette, MD       nitroGLYCERIN (NITROSTAT) SL tablet 0.4 mg  0.4 mg Sublingual Q5 min PRN Clegg, Amy D, NP       [START ON 07/19/2019] nitroGLYCERIN 50 mg in dextrose 5 % 250 mL (0.2 mg/mL) infusion  2-200 mcg/min Intravenous To OR Delores Edelstein, Merri Brunette, MD       ondansetron (ZOFRAN) injection 4  mg  4 mg Intravenous Q6H PRN Ninfa Meeker, Amy D, NP       [START ON 07/19/2019] phenylephrine (NEOSYNEPHRINE)  20-0.9 MG/250ML-% infusion  30-200 mcg/min Intravenous To OR Lamiracle Chaidez, Glenice Bow, MD       [START ON 07/19/2019] potassium chloride injection 80 mEq  80 mEq Other To OR Wilburt Messina, Glenice Bow, MD       sodium chloride flush (NS) 0.9 % injection 3 mL  3 mL Intravenous Q12H Clegg, Amy D, NP   3 mL at 07/17/19 2129   sodium chloride flush (NS) 0.9 % injection 3 mL  3 mL Intravenous PRN Clegg, Amy D, NP       spironolactone (ALDACTONE) tablet 12.5 mg  12.5 mg Oral Daily Clegg, Amy D, NP   12.5 mg at 07/18/19 0941   temazepam (RESTORIL) capsule 15 mg  15 mg Oral Once PRN Wonda Olds, MD       [START ON 07/19/2019] tranexamic acid (CYKLOKAPRON) 2,500 mg in sodium chloride 0.9 % 250 mL (10 mg/mL) infusion  1.5 mg/kg/hr Intravenous To OR Wonda Olds, MD       [START ON 07/19/2019] tranexamic acid (CYKLOKAPRON) bolus via infusion - over 30 minutes 924 mg  15 mg/kg Intravenous To OR Wonda Olds, MD       [START ON 07/19/2019] tranexamic acid (CYKLOKAPRON) pump prime solution 123 mg  2 mg/kg Intracatheter To OR Emily Massar, Glenice Bow, MD       [START ON 07/19/2019] vancomycin (VANCOCIN) 1,000 mg in sodium chloride 0.9 % 1,000 mL irrigation   Irrigation To OR Linde Wilensky, Glenice Bow, MD       [START ON 07/19/2019] vancomycin (VANCOCIN) 1,250 mg in sodium chloride 0.9 % 250 mL IVPB  1,250 mg Intravenous To OR Lorrinda Ramstad, Glenice Bow, MD        Medications Prior to Admission  Medication Sig Dispense Refill Last Dose   aspirin EC 325 MG EC tablet Take 1 tablet (325 mg total) by mouth daily. 30 tablet 0    atorvastatin (LIPITOR) 40 MG tablet Take 1 tablet (40 mg total) by mouth daily at 6 PM. 30 tablet 0    carvedilol (COREG) 3.125 MG tablet Take 1 tablet (3.125 mg total) by mouth 2 (two) times daily with a meal. 30 tablet 0    lisinopril (ZESTRIL) 5 MG tablet Take 1 tablet (5 mg total) by mouth daily. 30 tablet 0    nitroGLYCERIN (NITROSTAT) 0.4 MG SL tablet Place 1 tablet (0.4 mg total) under the tongue  every 5 (five) minutes as needed for chest pain. 30 tablet 0     History reviewed. No pertinent family history.   Review of Systems:   ROS Pertinent items noted in HPI and remainder of comprehensive ROS otherwise negative.     Cardiac Review of Systems: Y or  [    ]= no  Chest Pain [    ]  Resting SOB [   ] Exertional SOB  [  ]  Orthopnea [  ]   Pedal Edema [   ]    Palpitations [  ] Syncope  [  ]   Presyncope [   ]  General Review of Systems: [Y] = yes [  ]=no Constitional: recent weight change [  ]; anorexia [  ]; fatigue [  ]; nausea [  ]; night sweats [  ]; fever [  ]; or chills [  ]  Dental: Last Dentist visit:   Eye : blurred vision [  ]; diplopia [   ]; vision changes [  ];  Amaurosis fugax[  ]; Resp: cough [  ];  wheezing[  ];  hemoptysis[  ]; shortness of breath[  ]; paroxysmal nocturnal dyspnea[  ]; dyspnea on exertion[  ]; or orthopnea[  ];  GI:  gallstones[  ], vomiting[  ];  dysphagia[  ]; melena[  ];  hematochezia [  ]; heartburn[  ];   Hx of  Colonoscopy[  ]; GU: kidney stones [  ]; hematuria[  ];   dysuria [  ];  nocturia[  ];  history of     obstruction [  ]; urinary frequency [  ]             Skin: rash, swelling[  ];, hair loss[  ];  peripheral edema[  ];  or itching[  ]; Musculosketetal: myalgias[  ];  joint swelling[  ];  joint erythema[  ];  joint pain[  ];  back pain[  ];  Heme/Lymph: bruising[  ];  bleeding[  ];  anemia[  ];  Neuro: TIA[  ];  headaches[  ];  stroke[  ];  vertigo[  ];  seizures[  ];   paresthesias[  ];  difficulty walking[  ];  Psych:depression[  ]; anxiety[  ];  Endocrine: diabetes[  ];  thyroid dysfunction[  ]      Physical Exam: BP 140/66 (BP Location: Left Arm)    Pulse 85    Temp 98.5 F (36.9 C) (Oral)    Resp 20    Ht  (1.676 m)    Wt 61.6 kg    SpO2 98%    BMI 21.93 kg/m    General appearance: alert, cooperative and no distress Head: Normocephalic, without obvious  abnormality, atraumatic Neck: no adenopathy, no carotid bruit, no JVD, supple, symmetrical, trachea midline and thyroid not enlarged, symmetric, no tenderness/mass/nodules Lymph nodes: Cervical, supraclavicular, and axillary nodes normal. Resp: clear to auscultation bilaterally Cardio: regular rate and rhythm, S1, S2 normal, no murmur, click, rub or gallop GI: soft, non-tender; bowel sounds normal; no masses,  no organomegaly Extremities: edema 1+ Neurologic: Grossly normal  Diagnostic Studies & Laboratory data:     Recent Radiology Findings:   Ct Chest Wo Contrast  Result Date: 07/17/2019 CLINICAL DATA:  CT chest without, nontraumatic aortic disease EXAM: CT CHEST WITHOUT CONTRAST TECHNIQUE: Multidetector CT imaging of the chest was performed following the standard protocol without IV contrast. COMPARISON:  Chest radiograph 07/16/2019 FINDINGS: Cardiovascular: Evaluation of the aortic lumen is limited in the absence of contrast administration. The aortic root is suboptimally assessed given cardiac pulsation artifact. Extensive leaflet calcifications are no upon the aortic valve. Borderline dilatation of the ascending thoracic aorta to 4.0 cm. Aorta returns to normal caliber by the level of the distal arch measuring 2.7 cm just distal to the left subclavian artery. Descending thoracic aorta measures 2.3 cm. Atheromatous plaque is present throughout the aorta. The aorta is uncoiled and slightly tortuous. Mild cardiomegaly with left atrial enlargement. There is extensive calcified three-vessel coronary artery disease. Trace pericardial effusion. Central pulmonary arterial enlargement is present. Mediastinum/Nodes: Heterogeneously attenuating 3.9 cm lesion arising from the left lobe thyroid. Right lobe thyroid gland is somewhat nodular but less pronounced. A partially hyperattenuating 1.8 cm lesion arises from the pyramidal lobe of the thyroid. Several calcified AP window, subcarinal and paratracheal  lymph nodes are present. Hilar adenopathy is poorly assessed  in the absence of contrast media. The trachea is grossly normal. Small sliding-type hiatal hernia. Lungs/Pleura: There are moderate bilateral pleural effusions. Adjacent areas of passive atelectasis are present with subtotal collapse of both lower lobes. Mild airways thickening. Patchy area of peribronchovascular ground-glass is noted lingula (3/63). Few calcified granulomas are noted throughout the lungs. Additional dependent atelectasis is seen posteriorly. Upper Abdomen: Sense of calcification of the upper abdominal aorta and splenic artery. Indeterminate heterogeneously attenuating lesion arising from the upper pole left kidney measuring up 3 cm. Musculoskeletal: Multilevel severe degenerative changes are present in the imaged portions of the spine. Large Schmorl's node formation versus remote superior endplate compression deformity of T12. no suspicious osseous lesions. IMPRESSION: 1. Evaluation of the aortic lumen is limited in the absence of contrast administration. Borderline dilatation of the ascending thoracic aorta to 4.0 cm. Recommend annual imaging followup by CTA or MRA. This recommendation follows 2010 ACCF/AHA/AATS/ACR/ASA/SCA/SCAI/SIR/STS/SVM Guidelines for the Diagnosis and Management of Patients with Thoracic Aortic Disease. Circulation. 2010; 121: Z610-R604. Aortic aneurysm NOS (ICD10-I71.9) 2. Extensive calcified three-vessel coronary artery disease. 3. Cardiomegaly with left atrial enlargement. 4. Patchy area of peribronchovascular ground-glass is noted in the lingula, concerning for infectious/inflammatory process. 5. Moderate bilateral pleural effusions with adjacent areas of passive atelectasis. 6. Large heterogeneously attenuating mass arising from the left lobe thyroid and hyperattenuating nodule arising from the pyramidal lobe. Recommend further evaluation dedicated thyroid ultrasound. This follows ACR consensus guidelines:  Managing Incidental Thyroid Nodules Detected on Imaging: White Paper of the ACR Incidental Thyroid Findings Committee. J Am Coll Radiol 2015; 12:143-150. 7. Indeterminate heterogeneously attenuating lesion arising from the upper pole left kidney measuring up to 3 cm. Further evaluation with dedicated multiphase renal CT or MRI is recommended. Insert reference renal This recommendation follows ACR consensus guidelines: Management of the Incidental Renal Mass on CT: A White Paper of the ACR Incidental Findings Committee. J Am Coll Radiol [phone]. 8. Aortic Atherosclerosis (ICD10-I70.0). Electronically Signed   By: Kreg Shropshire M.D.   On: 07/17/2019 21:48   Vas US Doppler Pre Cabg  Result Date: 07/18/2019 PREOPERATIVE VASCULAR EVALUATION  Indications:      Pre CABG. Risk Factors:     Diabetes. Comparison Study: no prior Performing Technologist: Jeb Levering Rvt, Rdms  Examination Guidelines: A complete evaluation includes B-mode imaging, spectral Doppler, color Doppler, and power Doppler as needed of all accessible portions of each vessel. Bilateral testing is considered an integral part of a complete examination. Limited examinations for reoccurring indications may be performed as noted.  Right Carotid Findings: +----------+--------+--------+--------+------------+--------+             PSV cm/s EDV cm/s Stenosis Describe     Comments  +----------+--------+--------+--------+------------+--------+  CCA Prox   78       7                                        +----------+--------+--------+--------+------------+--------+  CCA Distal 52       11                                       +----------+--------+--------+--------+------------+--------+  ICA Prox   62       15       1-39%    heterogenous           +----------+--------+--------+--------+------------+--------+  ICA Distal 86       16                                       +----------+--------+--------+--------+------------+--------+  ECA        66       2                                         +----------+--------+--------+--------+------------+--------+ Portions of this table do not appear on this page. +----------+--------+-------+----------------+------------+             PSV cm/s EDV cms Describe         Arm Pressure  +----------+--------+-------+----------------+------------+  Subclavian 145              Multiphasic, WNL 129           +----------+--------+-------+----------------+------------+ +---------+--------+--+--------+--+---------+  Vertebral PSV cm/s 70 EDV cm/s 12 Antegrade  +---------+--------+--+--------+--+---------+ Left Carotid Findings: +----------+--------+--------+--------+------------+--------+             PSV cm/s EDV cm/s Stenosis Describe     Comments  +----------+--------+--------+--------+------------+--------+  CCA Prox   89       10                                       +----------+--------+--------+--------+------------+--------+  CCA Distal 59       5                                        +----------+--------+--------+--------+------------+--------+  ICA Prox   74       18       1-39%    heterogenous           +----------+--------+--------+--------+------------+--------+  ICA Distal 68       17                                       +----------+--------+--------+--------+------------+--------+  ECA        149      3                                        +----------+--------+--------+--------+------------+--------+ +----------+--------+--------+----------------+------------+  Subclavian PSV cm/s EDV cm/s Describe         Arm Pressure  +----------+--------+--------+----------------+------------+             124               Multiphasic, WNL               +----------+--------+--------+----------------+------------+ +---------+--------+--+--------+--+---------+  Vertebral PSV cm/s 54 EDV cm/s 17 Antegrade  +---------+--------+--+--------+--+---------+ INCIDENTAL FINDING: enlarged, hyperechoic left thyroid ABI Findings:  +--------+------------------+-----+----------+--------+  Right    Rt Pressure (mmHg) Index Waveform   Comment   +--------+------------------+-----+----------+--------+  Brachial 129                      triphasic            +--------+------------------+-----+----------+--------+  ATA      108                0.84  monophasic           +--------+------------------+-----+----------+--------+  PTA      97                 0.75  monophasic           +--------+------------------+-----+----------+--------+ +--------+------------------+-----+----------+-------------------------+  Left     Lt Pressure (mmHg) Index Waveform   Comment                    +--------+------------------+-----+----------+-------------------------+  Brachial                          biphasic   BP not obtained due to IV  +--------+------------------+-----+----------+-------------------------+  ATA      88                 0.68  biphasic                              +--------+------------------+-----+----------+-------------------------+  PTA      109                0.84  monophasic                            +--------+------------------+-----+----------+-------------------------+  Right Doppler Findings: +--------+--------+-----+---------+--------+  Site     Pressure Index Doppler   Comments  +--------+--------+-----+---------+--------+  Brachial 129            triphasic           +--------+--------+-----+---------+--------+  Radial                  triphasic           +--------+--------+-----+---------+--------+  Ulnar                   triphasic           +--------+--------+-----+---------+--------+  Left Doppler Findings: +--------+--------+-----+---------+-------------------------+  Site     Pressure Index Doppler   Comments                   +--------+--------+-----+---------+-------------------------+  Brachial                biphasic  BP not obtained due to IV  +--------+--------+-----+---------+-------------------------+  Radial                   triphasic                            +--------+--------+-----+---------+-------------------------+  Ulnar                   triphasic                            +--------+--------+-----+---------+-------------------------+  Summary: Right Carotid: Velocities in the right ICA are consistent with a 1-39% stenosis. Left Carotid: Velocities in the left ICA are consistent with a 1-39% stenosis. Right ABI: Resting right ankle-brachial index indicates mild right lower extremity arterial disease. Left ABI: Resting left ankle-brachial index indicates mild left lower extremity arterial disease. Right Upper Extremity: Doppler waveforms remain within normal limits with right radial compression. Doppler waveforms remain within normal limits  with right ulnar compression. Left Upper Extremity: Doppler waveforms decrease >50% with left radial compression. Doppler waveforms remain within normal limits with left ulnar compression.  Electronically signed by Waverly Ferrari MD on 07/18/2019 at 2:54:38 PM.    Final      I have independently reviewed the above radiologic studies and discussed with the patient   Recent Lab Findings: Lab Results  Component Value Date   WBC 6.6 07/18/2019   HGB 10.5 (L) 07/18/2019   HCT 34.6 (L) 07/18/2019   PLT 253 07/18/2019   GLUCOSE 106 (H) 07/18/2019   ALT 24 07/16/2019   AST 34 07/16/2019   NA 138 07/18/2019   K 3.3 (L) 07/18/2019   CL 98 07/18/2019   CREATININE 0.65 07/18/2019   BUN 15 07/18/2019   CO2 28 07/18/2019   INR 1.2 07/16/2019   HGBA1C 5.5 07/16/2019      Assessment / Plan:      85 yo lady with left main CAD and mildly reduced LV function. She is at higher risk due to age and lower body mass. Nevertheless, she appears to have no prohibitive risks for surgery. She has great family support who have had the opportunity to weigh in and ask questions about the procedure and recovery. She wishes to proceed with CABG.     I  spent 60 minutes counseling the  patient face to face.  Kerrilyn Azbill Z. Vickey Sages, MD [phone] 07/18/2019 8:23 PM

## 2019-07-18 NOTE — Progress Notes (Signed)
ANTICOAGULATION CONSULT NOTE - Follow Up Consult  Pharmacy Consult for Heparin Indication: LM CAD  No Known Allergies  Patient Measurements: Height: 5\' 6"  (167.6 cm) Weight: 135 lb 14.4 oz (61.6 kg) IBW/kg (Calculated) : 59.3 Heparin Dosing Weight: 61.6 kg  Vital Signs: Temp: 98.5 F (36.9 C) (09/15 0940) Temp Source: Oral (09/15 0940) BP: 127/69 (09/15 0940) Pulse Rate: 89 (09/15 0940)  Labs: Recent Labs    07/16/19 0141 07/16/19 0348 07/16/19 0617  07/16/19 1909 07/17/19 0440 07/17/19 1641 07/18/19 0733  HGB 11.7*  --  11.4*  --   --  10.3* 12.4 10.5*  HCT 39.5  --  37.1  --   --  33.1* 38.2 34.6*  PLT 296  --  279  --   --  257 279 253  APTT <24*  --   --   --   --   --   --   --   LABPROT 15.2  --   --   --   --   --   --   --   INR 1.2  --   --   --   --   --   --   --   HEPARINUNFRC  --   --   --    < > 0.32 0.20*  --  0.38  CREATININE 0.86  --   --   --   --  0.58 0.77 0.65  TROPONINIHS 92* 193* 599*  --   --   --   --   --    < > = values in this interval not displayed.    Estimated Creatinine Clearance: 47.3 mL/min (by C-G formula based on SCr of 0.65 mg/dL).  Assessment:  Anticoag: HF and  3VCAD for CABG evaluation for LM dz.  She was noted with afib (CHADSVASC= 6) and pharmacy consulted to dose heparin.  - HL 0.38 in goal. Hgb 12.3>10.5. Plts 253 ok.   Goal of Therapy:  Heparin level 0.3-0.7 units/ml Monitor platelets by anticoagulation protocol: Yes   Plan:  - Continue IV heparin at 850 units/hr - Daily HL and CBC -f/u CABG plans   Chaunce Winkels S. Alford Highland, PharmD, BCPS Clinical Staff Pharmacist Eilene Ghazi Stillinger 07/18/2019,9:52 AM

## 2019-07-18 NOTE — Progress Notes (Signed)
Patient is in Atrial fibrillation with RVR rate from 140s to 180s confirmed by doing 12 leads EKG, patient is asymptomatic,no complain of chest pain,V/S checked.Dr. Harrington Challenger (cardiology on call made aware with order made,metoprolol 12.5 mg PO given,heparin drip started.Patient converted back to sinus rhythm.Will continue to monitor.

## 2019-07-18 NOTE — Anesthesia Preprocedure Evaluation (Addendum)
Anesthesia Evaluation  Patient identified by MRN, date of birth, ID band Patient awake    Reviewed: Allergy & Precautions, NPO status , Patient's Chart, lab work & pertinent test results  History of Anesthesia Complications Negative for: history of anesthetic complications  Airway Mallampati: I  TM Distance: >3 FB Neck ROM: Full    Dental  (+) Edentulous Upper, Edentulous Lower, Dental Advisory Given   Pulmonary neg pulmonary ROS,    Pulmonary exam normal        Cardiovascular hypertension, Pt. on home beta blockers + CAD, + Past MI and +CHF  Normal cardiovascular exam  1. Severe three-vessel and left main coronary artery disease. The coronary arteries are overall moderately to severely calcified. 2. Moderately to severely reduced LV systolic function with an EF of 30 to 35%. Left ventricular angiogram was not optimal due to PVCs and poor filling the an echo was done before the cath. 3. Right heart catheterization showed moderately elevated filling pressures with mean wedge pressure of 25 mmHg, moderate pulmonary hypertension at 50/24 mmHg and moderately severely reduced cardiac output at 3.41 with a cardiac index of 1.95.  IMPRESSIONS    1. The left ventricle has moderate-severely reduced systolic function, with an ejection fraction of 30-35%. The cavity size was normal. There is mildly increased left ventricular wall thickness. Left ventricular diastolic Doppler parameters are  consistent with impaired relaxation. Left ventricular diffuse hypokinesis.Unable to exclude regional wall motion abnormality.  2. The right ventricle has normal systolic function. The cavity was normal. There is no increase in right ventricular wall thickness.Unable to estimate RVSP.  3. Moderate calcification of the aortic valve. Aortic valve regurgitation is mild    Neuro/Psych negative neurological ROS     GI/Hepatic negative GI ROS, Neg  liver ROS,   Endo/Other  negative endocrine ROS  Renal/GU negative Renal ROS     Musculoskeletal negative musculoskeletal ROS (+)   Abdominal   Peds  Hematology negative hematology ROS (+)   Anesthesia Other Findings Day of surgery medications reviewed with the patient.  Reproductive/Obstetrics                            Anesthesia Physical Anesthesia Plan  ASA: IV  Anesthesia Plan: General   Post-op Pain Management:    Induction: Intravenous  PONV Risk Score and Plan: 3 and Ondansetron and Midazolam  Airway Management Planned: Oral ETT  Additional Equipment: Arterial line, PA Cath, 3D TEE and Ultrasound Guidance Line Placement  Intra-op Plan:   Post-operative Plan: Post-operative intubation/ventilation  Informed Consent: I have reviewed the patients History and Physical, chart, labs and discussed the procedure including the risks, benefits and alternatives for the proposed anesthesia with the patient or authorized representative who has indicated his/her understanding and acceptance.     Dental advisory given  Plan Discussed with: CRNA and Anesthesiologist  Anesthesia Plan Comments:        Anesthesia Quick Evaluation

## 2019-07-18 NOTE — Progress Notes (Signed)
0630-160 Did not walk with pt due to LM dz. Assisted pt to the recliner as she wanted to go to chair. Gave pt [facility] booklet and staying in the tube handout. Discussed sternal precautions and keeping arms close to the body. Gave pt IS and pt could only get to 250 -549ml. Discussed importance of IS and mobility after surgery. Pt stated her son would be able to help take care of her after discharge. Wrote down how to view pre op video. Stated she is waiting for her daughter to watch. Encouraged pt to call when she needed to get up. Has call light. Will follow up after surgery. Graylon Good RN BSN 07/18/2019 9:30 AM

## 2019-07-18 NOTE — Progress Notes (Addendum)
Advanced Heart Failure Rounding Note  PCP-Cardiologist: No primary care provider on file.   Subjective:   Last night she went in to A fib RVR. Placed back on heparin drip. Now back in NSR.   Ambulating room without CP. Excellent diuresis. Weight down 5 pounds. Orthopnea has resolved.    Objective:   Weight Range: 61.6 kg Body mass index is 21.93 kg/m.   Vital Signs:   Temp:  [98 F (36.7 C)-98.5 F (36.9 C)] 98.3 F (36.8 C) (09/15 1137) Pulse Rate:  [64-106] 74 (09/15 1137) Resp:  [15-22] 19 (09/15 1137) BP: (108-138)/(67-87) 122/67 (09/15 1137) SpO2:  [95 %-99 %] 97 % (09/15 1137) Weight:  [61.6 kg] 61.6 kg (09/15 0413) Last BM Date: 07/17/19  Weight change: Filed Weights   07/17/19 1512 07/18/19 0413  Weight: 62.6 kg 61.6 kg    Intake/Output:   Intake/Output Summary (Last 24 hours) at 07/18/2019 1717 Last data filed at 07/18/2019 1500 Gross per 24 hour  Intake 130.23 ml  Output 850 ml  Net -719.77 ml      Physical Exam    General:  Elderly woman Sitting in the chair. No resp difficulty HEENT: Normal anicteric Neck: Supple. JVP 5-6 . Carotids 2+ bilat; no bruits. No lymphadenopathy or thyromegaly appreciated. Cor: PMI nondisplaced. Regular rate & rhythm. No rubs, gallops or murmurs. Lungs: Clear no wheezing Abdomen: Soft, nontender, nondistended. No hepatosplenomegaly. No bruits or masses. Good bowel sounds. Extremities: no cyanosis, clubbing, rash, trace edema Neuro: alert & oriented x 3, cranial nerves grossly intact. moves all 4 extremities w/o difficulty. Affect pleasant   Telemetry   SR 80s  Last night she had brief episode of A fib RVR. Personally reviewed   EKG    N/a   Labs    CBC Recent Labs    07/16/19 0617 07/17/19 0440 07/17/19 1641 07/18/19 0733  WBC 12.0* 9.2 8.9 6.6  NEUTROABS 11.1* 6.3  --   --   HGB 11.4* 10.3* 12.4 10.5*  HCT 37.1 33.1* 38.2 34.6*  MCV 75.3* 75.1* 75.5* 76.7*  PLT 279 257 279 253   Basic  Metabolic Panel Recent Labs    45/40/9809/13/20 0141 07/17/19 0440 07/17/19 1641 07/18/19 0733  NA 136 139  --  138  K 4.1 3.4*  --  3.3*  CL 104 104  --  98  CO2 19* 27  --  28  GLUCOSE 314* 104*  --  106*  BUN 16 17  --  15  CREATININE 0.86 0.58 0.77 0.65  CALCIUM 9.6 9.5  --  9.5  MG 2.4  --   --   --    Liver Function Tests Recent Labs    07/16/19 0141  AST 34  ALT 24  ALKPHOS 95  BILITOT 0.8  PROT 6.4*  ALBUMIN 3.3*   No results for input(s): LIPASE, AMYLASE in the last 72 hours. Cardiac Enzymes No results for input(s): CKTOTAL, CKMB, CKMBINDEX, TROPONINI in the last 72 hours.  BNP: BNP (last 3 results) Recent Labs    07/16/19 0141  BNP 2,027.0*    ProBNP (last 3 results) No results for input(s): PROBNP in the last 8760 hours.   D-Dimer No results for input(s): DDIMER in the last 72 hours. Hemoglobin A1C Recent Labs    07/16/19 0348  HGBA1C 5.5   Fasting Lipid Panel No results for input(s): CHOL, HDL, LDLCALC, TRIG, CHOLHDL, LDLDIRECT in the last 72 hours. Thyroid Function Tests No results for input(s): TSH, T4TOTAL,  T3FREE, THYROIDAB in the last 72 hours.  Invalid input(s): FREET3  Other results:   Imaging    Ct Chest Wo Contrast  Result Date: 07/17/2019 CLINICAL DATA:  CT chest without, nontraumatic aortic disease EXAM: CT CHEST WITHOUT CONTRAST TECHNIQUE: Multidetector CT imaging of the chest was performed following the standard protocol without IV contrast. COMPARISON:  Chest radiograph 07/16/2019 FINDINGS: Cardiovascular: Evaluation of the aortic lumen is limited in the absence of contrast administration. The aortic root is suboptimally assessed given cardiac pulsation artifact. Extensive leaflet calcifications are no upon the aortic valve. Borderline dilatation of the ascending thoracic aorta to 4.0 cm. Aorta returns to normal caliber by the level of the distal arch measuring 2.7 cm just distal to the left subclavian artery. Descending thoracic  aorta measures 2.3 cm. Atheromatous plaque is present throughout the aorta. The aorta is uncoiled and slightly tortuous. Mild cardiomegaly with left atrial enlargement. There is extensive calcified three-vessel coronary artery disease. Trace pericardial effusion. Central pulmonary arterial enlargement is present. Mediastinum/Nodes: Heterogeneously attenuating 3.9 cm lesion arising from the left lobe thyroid. Right lobe thyroid gland is somewhat nodular but less pronounced. A partially hyperattenuating 1.8 cm lesion arises from the pyramidal lobe of the thyroid. Several calcified AP window, subcarinal and paratracheal lymph nodes are present. Hilar adenopathy is poorly assessed in the absence of contrast media. The trachea is grossly normal. Small sliding-type hiatal hernia. Lungs/Pleura: There are moderate bilateral pleural effusions. Adjacent areas of passive atelectasis are present with subtotal collapse of both lower lobes. Mild airways thickening. Patchy area of peribronchovascular ground-glass is noted lingula (3/63). Few calcified granulomas are noted throughout the lungs. Additional dependent atelectasis is seen posteriorly. Upper Abdomen: Sense of calcification of the upper abdominal aorta and splenic artery. Indeterminate heterogeneously attenuating lesion arising from the upper pole left kidney measuring up 3 cm. Musculoskeletal: Multilevel severe degenerative changes are present in the imaged portions of the spine. Large Schmorl's node formation versus remote superior endplate compression deformity of T12. no suspicious osseous lesions. IMPRESSION: 1. Evaluation of the aortic lumen is limited in the absence of contrast administration. Borderline dilatation of the ascending thoracic aorta to 4.0 cm. Recommend annual imaging followup by CTA or MRA. This recommendation follows 2010 ACCF/AHA/AATS/ACR/ASA/SCA/SCAI/SIR/STS/SVM Guidelines for the Diagnosis and Management of Patients with Thoracic Aortic Disease.  Circulation. 2010; 121: O841-Y606. Aortic aneurysm NOS (ICD10-I71.9) 2. Extensive calcified three-vessel coronary artery disease. 3. Cardiomegaly with left atrial enlargement. 4. Patchy area of peribronchovascular ground-glass is noted in the lingula, concerning for infectious/inflammatory process. 5. Moderate bilateral pleural effusions with adjacent areas of passive atelectasis. 6. Large heterogeneously attenuating mass arising from the left lobe thyroid and hyperattenuating nodule arising from the pyramidal lobe. Recommend further evaluation dedicated thyroid ultrasound. This follows ACR consensus guidelines: Managing Incidental Thyroid Nodules Detected on Imaging: White Paper of the ACR Incidental Thyroid Findings Committee. J Am Coll Radiol 2015; 12:143-150. 7. Indeterminate heterogeneously attenuating lesion arising from the upper pole left kidney measuring up to 3 cm. Further evaluation with dedicated multiphase renal CT or MRI is recommended. Insert reference renal This recommendation follows ACR consensus guidelines: Management of the Incidental Renal Mass on CT: A White Paper of the ACR Incidental Findings Committee. J Am Coll Radiol [phone]. 8. Aortic Atherosclerosis (ICD10-I70.0). Electronically Signed   By: Kreg Shropshire M.D.   On: 07/17/2019 21:48   Vas US Doppler Pre Cabg  Result Date: 07/18/2019 PREOPERATIVE VASCULAR EVALUATION  Indications:      Pre CABG. Risk Factors:  Diabetes. Comparison Study: no prior Performing Technologist: Jeb Levering Rvt, Rdms  Examination Guidelines: A complete evaluation includes B-mode imaging, spectral Doppler, color Doppler, and power Doppler as needed of all accessible portions of each vessel. Bilateral testing is considered an integral part of a complete examination. Limited examinations for reoccurring indications may be performed as noted.  Right Carotid Findings: +----------+--------+--------+--------+------------+--------+             PSV cm/s EDV  cm/s Stenosis Describe     Comments  +----------+--------+--------+--------+------------+--------+  CCA Prox   78       7                                        +----------+--------+--------+--------+------------+--------+  CCA Distal 52       11                                       +----------+--------+--------+--------+------------+--------+  ICA Prox   62       15       1-39%    heterogenous           +----------+--------+--------+--------+------------+--------+  ICA Distal 86       16                                       +----------+--------+--------+--------+------------+--------+  ECA        66       2                                        +----------+--------+--------+--------+------------+--------+ Portions of this table do not appear on this page. +----------+--------+-------+----------------+------------+             PSV cm/s EDV cms Describe         Arm Pressure  +----------+--------+-------+----------------+------------+  Subclavian 145              Multiphasic, WNL 129           +----------+--------+-------+----------------+------------+ +---------+--------+--+--------+--+---------+  Vertebral PSV cm/s 70 EDV cm/s 12 Antegrade  +---------+--------+--+--------+--+---------+ Left Carotid Findings: +----------+--------+--------+--------+------------+--------+             PSV cm/s EDV cm/s Stenosis Describe     Comments  +----------+--------+--------+--------+------------+--------+  CCA Prox   89       10                                       +----------+--------+--------+--------+------------+--------+  CCA Distal 59       5                                        +----------+--------+--------+--------+------------+--------+  ICA Prox   74       18       1-39%    heterogenous           +----------+--------+--------+--------+------------+--------+  ICA Distal 68       17                                       +----------+--------+--------+--------+------------+--------+  ECA        149      3                                         +----------+--------+--------+--------+------------+--------+ +----------+--------+--------+----------------+------------+  Subclavian PSV cm/s EDV cm/s Describe         Arm Pressure  +----------+--------+--------+----------------+------------+             124               Multiphasic, WNL               +----------+--------+--------+----------------+------------+ +---------+--------+--+--------+--+---------+  Vertebral PSV cm/s 54 EDV cm/s 17 Antegrade  +---------+--------+--+--------+--+---------+ INCIDENTAL FINDING: enlarged, hyperechoic left thyroid ABI Findings: +--------+------------------+-----+----------+--------+  Right    Rt Pressure (mmHg) Index Waveform   Comment   +--------+------------------+-----+----------+--------+  Brachial 129                      triphasic            +--------+------------------+-----+----------+--------+  ATA      108                0.84  monophasic           +--------+------------------+-----+----------+--------+  PTA      97                 0.75  monophasic           +--------+------------------+-----+----------+--------+ +--------+------------------+-----+----------+-------------------------+  Left     Lt Pressure (mmHg) Index Waveform   Comment                    +--------+------------------+-----+----------+-------------------------+  Brachial                          biphasic   BP not obtained due to IV  +--------+------------------+-----+----------+-------------------------+  ATA      88                 0.68  biphasic                              +--------+------------------+-----+----------+-------------------------+  PTA      109                0.84  monophasic                            +--------+------------------+-----+----------+-------------------------+  Right Doppler Findings: +--------+--------+-----+---------+--------+  Site     Pressure Index Doppler   Comments  +--------+--------+-----+---------+--------+  Brachial 129             triphasic           +--------+--------+-----+---------+--------+  Radial                  triphasic           +--------+--------+-----+---------+--------+  Ulnar                   triphasic           +--------+--------+-----+---------+--------+  Left Doppler Findings: +--------+--------+-----+---------+-------------------------+  Site     Pressure Index Doppler   Comments                   +--------+--------+-----+---------+-------------------------+  Brachial  biphasic  BP not obtained due to IV  +--------+--------+-----+---------+-------------------------+  Radial                  triphasic                            +--------+--------+-----+---------+-------------------------+  Ulnar                   triphasic                            +--------+--------+-----+---------+-------------------------+  Summary: Right Carotid: Velocities in the right ICA are consistent with a 1-39% stenosis. Left Carotid: Velocities in the left ICA are consistent with a 1-39% stenosis. Right ABI: Resting right ankle-brachial index indicates mild right lower extremity arterial disease. Left ABI: Resting left ankle-brachial index indicates mild left lower extremity arterial disease. Right Upper Extremity: Doppler waveforms remain within normal limits with right radial compression. Doppler waveforms remain within normal limits with right ulnar compression. Left Upper Extremity: Doppler waveforms decrease >50% with left radial compression. Doppler waveforms remain within normal limits with left ulnar compression.  Electronically signed by Deitra Mayo MD on 07/18/2019 at 2:54:38 PM.    Final       Medications:     Scheduled Medications:  aspirin EC  81 mg Oral Daily   atorvastatin  40 mg Oral q1800   digoxin  0.125 mg Oral Daily   [START ON 07/19/2019] epinephrine  0-10 mcg/min Intravenous To OR   [START ON 07/19/2019] heparin-papaverine-plasmalyte irrigation   Irrigation To OR   [START ON  07/19/2019] insulin   Intravenous To OR   [START ON 07/19/2019] magnesium sulfate  40 mEq Other To OR   metoprolol tartrate  12.5 mg Oral BID   [START ON 07/19/2019] phenylephrine  30-200 mcg/min Intravenous To OR   [START ON 07/19/2019] potassium chloride  80 mEq Other To OR   sodium chloride flush  3 mL Intravenous Q12H   spironolactone  12.5 mg Oral Daily   [START ON 07/19/2019] tranexamic acid  15 mg/kg Intravenous To OR   [START ON 07/19/2019] tranexamic acid  2 mg/kg Intracatheter To OR   [START ON 07/19/2019] vancomycin 1000 mg in NS (1000 ml) irrigation for Dr. Roxy Manns case   Irrigation To OR     Infusions:  sodium chloride     [START ON 07/19/2019] cefUROXime (ZINACEF)  IV     [START ON 07/19/2019] cefUROXime (ZINACEF)  IV     [START ON 07/19/2019] dexmedetomidine     [START ON 07/19/2019] DOPamine     [START ON 07/19/2019] heparin 30,000 units/NS 1000 mL solution for CELLSAVER     heparin 850 Units/hr (07/18/19 1500)   [START ON 07/19/2019] milrinone     [START ON 07/19/2019] nitroGLYCERIN     [START ON 07/19/2019] tranexamic acid (CYKLOKAPRON) infusion ([facility])     [START ON 07/19/2019] vancomycin       PRN Medications:  sodium chloride, acetaminophen, nitroGLYCERIN, ondansetron (ZOFRAN) IV, sodium chloride flush     Assessment/Plan   1. Acute coronary syndrome - Severe Multivessel Coronary Artery Disease HS Trop 16>109>604 LHC - distal LM lesion 90% stenosed, proximal circumflex 80% stenosis, proximal LAD to mid LAD 90% stenosed, first diagonal lesion 80% stenosed, mid circumflex 100% stenosed, proximal RCA 90% stenosed, and proximal RCA to mid RCA 100% stenosed. CT surgery consulted. Pre CABG testing completed. Planning for CABG tomorrow.  On statin, bb, asa.  - Continue heparin drip.   2. Acute Systolic HF, ICM  9/13 ECHO EF 30-35% RV ok  - LHC/RHC multivessel disease, CI 1.9, PCWP 25.  - Volume status improved. Stop IV lasix.  - Continue low dose  carvedilol  - Continue 12.5 mg losartan.  - Continue 12.5 mg spironolactone - Continue 0.125 mg digoxin.   Renal function stable.   3. HTN  Improved.   4. Anemia  Hgb 11.4> 10.3>10/5  - Check anemia panel.   5. Rheumatoid Arthritis.   6. A fib RVR Last night she had brief episode of A fib. Started on heparin.  Add amiodarone 200 mg twice a day.   We will follow after surgery.   Length of Stay: 1  Amy Clegg, NP  07/18/2019, 5:17 PM  Advanced Heart Failure Team Pager [phone] (M-F; 7a - 4p)  Please contact [facility] for night-coverage after hours (4p -7a ) and weekends on [email or web address]  Patient seen and examined with the above-signed Advanced Practice Provider and/or Housestaff. I personally reviewed laboratory data, imaging studies and relevant notes. I independently examined the patient and formulated the important aspects of the plan. I have edited the note to reflect any of my changes or salient points. I have personally discussed the plan with the patient and/or family.  Had brief episode of AF last night and heparin started. Now back in NSR. Ambulating room without CP. Volume status improved with IV lasix. Weight down 5 pounds and orthopnea resolved. K is low but bloodwork otherwise stable. Pre-op testing reviewed. Case d/w Dr. Vickey Sages and she is on for CABG tomorrow. We will start po amio.   I reviewed coronary angios with her and her daughter personally. All questions answered.   Arvilla Meres, MD  10:38 PM

## 2019-07-18 NOTE — Progress Notes (Signed)
Pre op vascular       has been completed. Preliminary results can be found under CV proc through chart review. Jayleana Colberg, BS, RDMS, RVT   

## 2019-07-19 ENCOUNTER — Inpatient Hospital Stay (HOSPITAL_COMMUNITY): Payer: Medicare Other

## 2019-07-19 ENCOUNTER — Inpatient Hospital Stay: Admit: 2019-07-19 | Payer: Medicare Other | Admitting: Cardiothoracic Surgery

## 2019-07-19 ENCOUNTER — Encounter (HOSPITAL_COMMUNITY)
Admission: AD | Disposition: A | Discharge status: Home / Self Care | Payer: Self-pay | Source: Other Acute Inpatient Hospital | Attending: Cardiothoracic Surgery

## 2019-07-19 ENCOUNTER — Inpatient Hospital Stay (HOSPITAL_COMMUNITY): Payer: Medicare Other | Admitting: Certified Registered"

## 2019-07-19 ENCOUNTER — Encounter (HOSPITAL_COMMUNITY): Payer: Self-pay | Admitting: Certified Registered Nurse Anesthetist

## 2019-07-19 DIAGNOSIS — I25110 Atherosclerotic heart disease of native coronary artery with unstable angina pectoris: Secondary | ICD-10-CM

## 2019-07-19 DIAGNOSIS — Z951 Presence of aortocoronary bypass graft: Secondary | ICD-10-CM

## 2019-07-19 HISTORY — PX: CORONARY ARTERY BYPASS GRAFT: SHX141

## 2019-07-19 HISTORY — PX: TEE WITHOUT CARDIOVERSION: SHX5443

## 2019-07-19 LAB — POCT I-STAT 7, (LYTES, BLD GAS, ICA,H+H)
Acid-Base Excess: 7 mmol/L — ABNORMAL HIGH (ref 0.0–2.0)
Acid-Base Excess: 1 mmol/L (ref 0.0–2.0)
Acid-base deficit: 2 mmol/L (ref 0.0–2.0)
Acid-base deficit: 3 mmol/L — ABNORMAL HIGH (ref 0.0–2.0)
Bicarbonate: 30 mmol/L — ABNORMAL HIGH (ref 20.0–28.0)
Bicarbonate: 26 mmol/L (ref 20.0–28.0)
Bicarbonate: 23.1 mmol/L (ref 20.0–28.0)
Bicarbonate: 21.7 mmol/L (ref 20.0–28.0)
Calcium, Ion: 1.13 mmol/L — ABNORMAL LOW (ref 1.15–1.40)
Calcium, Ion: 1.35 mmol/L (ref 1.15–1.40)
Calcium, Ion: 1.3 mmol/L (ref 1.15–1.40)
Calcium, Ion: 1.29 mmol/L (ref 1.15–1.40)
HCT: 23 % — ABNORMAL LOW (ref 36.0–46.0)
HCT: 23 % — ABNORMAL LOW (ref 36.0–46.0)
HCT: 26 % — ABNORMAL LOW (ref 36.0–46.0)
HCT: 23 % — ABNORMAL LOW (ref 36.0–46.0)
Hemoglobin: 7.8 g/dL — ABNORMAL LOW (ref 12.0–15.0)
Hemoglobin: 7.8 g/dL — ABNORMAL LOW (ref 12.0–15.0)
Hemoglobin: 8.8 g/dL — ABNORMAL LOW (ref 12.0–15.0)
Hemoglobin: 7.8 g/dL — ABNORMAL LOW (ref 12.0–15.0)
O2 Saturation: 100 %
O2 Saturation: 97 %
O2 Saturation: 98 %
O2 Saturation: 99 %
Patient temperature: 36.8
Patient temperature: 36.7
Potassium: 3.4 mmol/L — ABNORMAL LOW (ref 3.5–5.1)
Potassium: 3.2 mmol/L — ABNORMAL LOW (ref 3.5–5.1)
Potassium: 4.1 mmol/L (ref 3.5–5.1)
Potassium: 3.9 mmol/L (ref 3.5–5.1)
Sodium: 141 mmol/L (ref 135–145)
Sodium: 140 mmol/L (ref 135–145)
Sodium: 140 mmol/L (ref 135–145)
Sodium: 140 mmol/L (ref 135–145)
TCO2: 31 mmol/L (ref 22–32)
TCO2: 27 mmol/L (ref 22–32)
TCO2: 24 mmol/L (ref 22–32)
TCO2: 23 mmol/L (ref 22–32)
pCO2 arterial: 37.1 mmHg (ref 32.0–48.0)
pCO2 arterial: 42.2 mmHg (ref 32.0–48.0)
pCO2 arterial: 38.1 mmHg (ref 32.0–48.0)
pCO2 arterial: 36.4 mmHg (ref 32.0–48.0)
pH, Arterial: 7.515 — ABNORMAL HIGH (ref 7.350–7.450)
pH, Arterial: 7.398 (ref 7.350–7.450)
pH, Arterial: 7.39 (ref 7.350–7.450)
pH, Arterial: 7.383 (ref 7.350–7.450)
pO2, Arterial: 395 mmHg — ABNORMAL HIGH (ref 83.0–108.0)
pO2, Arterial: 92 mmHg (ref 83.0–108.0)
pO2, Arterial: 100 mmHg (ref 83.0–108.0)
pO2, Arterial: 159 mmHg — ABNORMAL HIGH (ref 83.0–108.0)

## 2019-07-19 LAB — CREATININE, SERUM
Creatinine, Ser: 0.51 mg/dL (ref 0.44–1.00)
GFR calc Af Amer: >60 mL/min (ref >60)
GFR calc non Af Amer: >60 mL/min (ref >60)

## 2019-07-19 LAB — BLOOD GAS, ARTERIAL
Acid-Base Excess: 4.9 mmol/L — ABNORMAL HIGH (ref 0.0–2.0)
Bicarbonate: 28.3 mmol/L — ABNORMAL HIGH (ref 20.0–28.0)
Drawn by: 511471
O2 Saturation: 94.7 %
Patient temperature: 98.6
pCO2 arterial: 37.3 mmHg (ref 32.0–48.0)
pH, Arterial: 7.493 — ABNORMAL HIGH (ref 7.350–7.450)
pO2, Arterial: 70.7 mmHg — ABNORMAL LOW (ref 83.0–108.0)

## 2019-07-19 LAB — CBC
HCT: 32.9 % — ABNORMAL LOW (ref 36.0–46.0)
HCT: 28 % — ABNORMAL LOW (ref 36.0–46.0)
HCT: 28.2 % — ABNORMAL LOW (ref 36.0–46.0)
Hemoglobin: 10.7 g/dL — ABNORMAL LOW (ref 12.0–15.0)
Hemoglobin: 8.5 g/dL — ABNORMAL LOW (ref 12.0–15.0)
Hemoglobin: 9.1 g/dL — ABNORMAL LOW (ref 12.0–15.0)
MCH: 24.5 pg — ABNORMAL LOW (ref 26.0–34.0)
MCH: 24.4 pg — ABNORMAL LOW (ref 26.0–34.0)
MCH: 25.4 pg — ABNORMAL LOW (ref 26.0–34.0)
MCHC: 32.5 g/dL (ref 30.0–36.0)
MCHC: 30.4 g/dL (ref 30.0–36.0)
MCHC: 32.3 g/dL (ref 30.0–36.0)
MCV: 75.5 fL — ABNORMAL LOW (ref 80.0–100.0)
MCV: 80.5 fL (ref 80.0–100.0)
MCV: 78.8 fL — ABNORMAL LOW (ref 80.0–100.0)
Platelets: 225 10*3/uL (ref 150–400)
Platelets: 180 10*3/uL (ref 150–400)
Platelets: 183 10*3/uL (ref 150–400)
RBC: 4.36 MIL/uL (ref 3.87–5.11)
RBC: 3.48 MIL/uL — ABNORMAL LOW (ref 3.87–5.11)
RBC: 3.58 MIL/uL — ABNORMAL LOW (ref 3.87–5.11)
RDW: 15.4 % (ref 11.5–15.5)
RDW: 16.7 % — ABNORMAL HIGH (ref 11.5–15.5)
RDW: 16.4 % — ABNORMAL HIGH (ref 11.5–15.5)
WBC: 5.8 10*3/uL (ref 4.0–10.5)
WBC: 11.5 10*3/uL — ABNORMAL HIGH (ref 4.0–10.5)
WBC: 11.2 10*3/uL — ABNORMAL HIGH (ref 4.0–10.5)
nRBC: 0 % (ref 0.0–0.2)
nRBC: 0 % (ref 0.0–0.2)
nRBC: 0 % (ref 0.0–0.2)

## 2019-07-19 LAB — GLUCOSE, CAPILLARY
Glucose-Capillary: 111 mg/dL — ABNORMAL HIGH (ref 70–99)
Glucose-Capillary: 133 mg/dL — ABNORMAL HIGH (ref 70–99)
Glucose-Capillary: 114 mg/dL — ABNORMAL HIGH (ref 70–99)
Glucose-Capillary: 131 mg/dL — ABNORMAL HIGH (ref 70–99)
Glucose-Capillary: 128 mg/dL — ABNORMAL HIGH (ref 70–99)
Glucose-Capillary: 127 mg/dL — ABNORMAL HIGH (ref 70–99)
Glucose-Capillary: 131 mg/dL — ABNORMAL HIGH (ref 70–99)
Glucose-Capillary: 161 mg/dL — ABNORMAL HIGH (ref 70–99)
Glucose-Capillary: 136 mg/dL — ABNORMAL HIGH (ref 70–99)

## 2019-07-19 LAB — POCT I-STAT 4, (NA,K, GLUC, HGB,HCT)
Glucose, Bld: 115 mg/dL — ABNORMAL HIGH (ref 70–99)
Glucose, Bld: 104 mg/dL — ABNORMAL HIGH (ref 70–99)
Glucose, Bld: 105 mg/dL — ABNORMAL HIGH (ref 70–99)
Glucose, Bld: 137 mg/dL — ABNORMAL HIGH (ref 70–99)
Glucose, Bld: 113 mg/dL — ABNORMAL HIGH (ref 70–99)
HCT: 30 % — ABNORMAL LOW (ref 36.0–46.0)
HCT: 27 % — ABNORMAL LOW (ref 36.0–46.0)
HCT: 22 % — ABNORMAL LOW (ref 36.0–46.0)
HCT: 23 % — ABNORMAL LOW (ref 36.0–46.0)
HCT: 23 % — ABNORMAL LOW (ref 36.0–46.0)
Hemoglobin: 10.2 g/dL — ABNORMAL LOW (ref 12.0–15.0)
Hemoglobin: 9.2 g/dL — ABNORMAL LOW (ref 12.0–15.0)
Hemoglobin: 7.5 g/dL — ABNORMAL LOW (ref 12.0–15.0)
Hemoglobin: 7.8 g/dL — ABNORMAL LOW (ref 12.0–15.0)
Hemoglobin: 7.8 g/dL — ABNORMAL LOW (ref 12.0–15.0)
Potassium: 3.6 mmol/L (ref 3.5–5.1)
Potassium: 3.5 mmol/L (ref 3.5–5.1)
Potassium: 3.2 mmol/L — ABNORMAL LOW (ref 3.5–5.1)
Potassium: 3.7 mmol/L (ref 3.5–5.1)
Potassium: 3.1 mmol/L — ABNORMAL LOW (ref 3.5–5.1)
Sodium: 137 mmol/L (ref 135–145)
Sodium: 138 mmol/L (ref 135–145)
Sodium: 138 mmol/L (ref 135–145)
Sodium: 140 mmol/L (ref 135–145)
Sodium: 140 mmol/L (ref 135–145)

## 2019-07-19 LAB — BASIC METABOLIC PANEL
Anion gap: 9 (ref 5–15)
BUN: 11 mg/dL (ref 8–23)
CO2: 27 mmol/L (ref 22–32)
Calcium: 9.4 mg/dL (ref 8.9–10.3)
Chloride: 102 mmol/L (ref 98–111)
Creatinine, Ser: 0.58 mg/dL (ref 0.44–1.00)
GFR calc Af Amer: >60 mL/min (ref >60)
GFR calc non Af Amer: >60 mL/min (ref >60)
Glucose, Bld: 99 mg/dL (ref 70–99)
Potassium: 3.7 mmol/L (ref 3.5–5.1)
Sodium: 138 mmol/L (ref 135–145)

## 2019-07-19 LAB — POCT I-STAT, CHEM 8
BUN: 9 mg/dL (ref 8–23)
Calcium, Ion: 1.27 mmol/L (ref 1.15–1.40)
Chloride: 107 mmol/L (ref 98–111)
Creatinine, Ser: 0.4 mg/dL — ABNORMAL LOW (ref 0.44–1.00)
Glucose, Bld: 124 mg/dL — ABNORMAL HIGH (ref 70–99)
HCT: 25 % — ABNORMAL LOW (ref 36.0–46.0)
Hemoglobin: 8.5 g/dL — ABNORMAL LOW (ref 12.0–15.0)
Potassium: 3.9 mmol/L (ref 3.5–5.1)
Sodium: 140 mmol/L (ref 135–145)
TCO2: 22 mmol/L (ref 22–32)

## 2019-07-19 LAB — ECHO INTRAOPERATIVE TEE
Height: 66 in
Weight: 2190.49 oz

## 2019-07-19 LAB — PLATELET COUNT: Platelets: 133 10*3/uL — ABNORMAL LOW (ref 150–400)

## 2019-07-19 LAB — PREPARE RBC (CROSSMATCH)

## 2019-07-19 LAB — PROTIME-INR
INR: 1.6 — ABNORMAL HIGH (ref 0.8–1.2)
Prothrombin Time: 19.2 seconds — ABNORMAL HIGH (ref 11.4–15.2)

## 2019-07-19 LAB — MAGNESIUM: Magnesium: 2.7 mg/dL — ABNORMAL HIGH (ref 1.7–2.4)

## 2019-07-19 LAB — APTT: aPTT: 32 seconds (ref 24–36)

## 2019-07-19 LAB — HEPARIN LEVEL (UNFRACTIONATED): Heparin Unfractionated: 0.13 IU/mL — ABNORMAL LOW (ref 0.30–0.70)

## 2019-07-19 LAB — HEMOGLOBIN AND HEMATOCRIT, BLOOD
HCT: 21.6 % — ABNORMAL LOW (ref 36.0–46.0)
Hemoglobin: 6.9 g/dL — CL (ref 12.0–15.0)

## 2019-07-19 SURGERY — CORONARY ARTERY BYPASS GRAFTING (CABG)
Anesthesia: General | Site: Chest

## 2019-07-19 MED ORDER — ASPIRIN 81 MG PO CHEW: 324.0000 mg | CHEWABLE_TABLET | Freq: Every day | ORAL | Status: DC

## 2019-07-19 MED ORDER — MIDAZOLAM HCL 5 MG/5ML IJ SOLN
INTRAMUSCULAR | Status: DC | PRN
  Administered 2019-07-19: 5 mg via INTRAVENOUS
  Administered 2019-07-19: 2 mg via INTRAVENOUS
  Administered 2019-07-19: 1 mg via INTRAVENOUS
  Administered 2019-07-19: 2 mg via INTRAVENOUS

## 2019-07-19 MED ORDER — PROPOFOL 10 MG/ML IV BOLUS
INTRAVENOUS | Status: AC
  Filled 2019-07-19: qty 20

## 2019-07-19 MED ORDER — FENTANYL CITRATE (PF) 250 MCG/5ML IJ SOLN
INTRAMUSCULAR | Status: DC | PRN
  Administered 2019-07-19: 150 ug via INTRAVENOUS
  Administered 2019-07-19: 50 ug via INTRAVENOUS
  Administered 2019-07-19 (×2): 100 ug via INTRAVENOUS
  Administered 2019-07-19: 50 ug via INTRAVENOUS
  Administered 2019-07-19 (×3): 100 ug via INTRAVENOUS
  Administered 2019-07-19: 50 ug via INTRAVENOUS
  Administered 2019-07-19 (×2): 100 ug via INTRAVENOUS

## 2019-07-19 MED ORDER — SODIUM CHLORIDE (PF) 0.9 % IJ SOLN
OROMUCOSAL | Status: DC | PRN
  Administered 2019-07-19 (×3): 4 mL via TOPICAL

## 2019-07-19 MED ORDER — CHLORHEXIDINE GLUCONATE CLOTH 2 % EX PADS
6.0000 | MEDICATED_PAD | Freq: Every day | CUTANEOUS | Status: DC
  Administered 2019-07-20 – 2019-07-23 (×4): 6 via TOPICAL

## 2019-07-19 MED ORDER — ROCURONIUM BROMIDE 10 MG/ML (PF) SYRINGE
PREFILLED_SYRINGE | INTRAVENOUS | Status: DC | PRN
  Administered 2019-07-19 (×2): 30 mg via INTRAVENOUS
  Administered 2019-07-19: 70 mg via INTRAVENOUS
  Administered 2019-07-19: 50 mg via INTRAVENOUS

## 2019-07-19 MED ORDER — EPINEPHRINE HCL 5 MG/250ML IV SOLN IN NS
1.0000 ug/min | INTRAVENOUS | Status: DC
  Filled 2019-07-19: qty 250

## 2019-07-19 MED ORDER — ROCURONIUM BROMIDE 10 MG/ML (PF) SYRINGE
PREFILLED_SYRINGE | INTRAVENOUS | Status: AC
  Filled 2019-07-19: qty 10

## 2019-07-19 MED ORDER — SODIUM CHLORIDE 0.9% IV SOLUTION: Freq: Once | INTRAVENOUS | Status: DC

## 2019-07-19 MED ORDER — ONDANSETRON HCL 4 MG/2ML IJ SOLN
4.0000 mg | Freq: Four times a day (QID) | INTRAMUSCULAR | Status: DC | PRN
  Administered 2019-07-22: 10:00:00 4 mg via INTRAVENOUS
  Filled 2019-07-19: qty 2

## 2019-07-19 MED ORDER — EPHEDRINE 5 MG/ML INJ
INTRAVENOUS | Status: AC
  Filled 2019-07-19: qty 10

## 2019-07-19 MED ORDER — ALBUMIN HUMAN 5 % IV SOLN
250.0000 mL | INTRAVENOUS | Status: AC | PRN
  Administered 2019-07-19 (×3): 12.5 g via INTRAVENOUS
  Filled 2019-07-19: qty 250

## 2019-07-19 MED ORDER — DOCUSATE SODIUM 100 MG PO CAPS
200.0000 mg | ORAL_CAPSULE | Freq: Every day | ORAL | Status: DC
  Administered 2019-07-20 – 2019-07-22 (×3): 200 mg via ORAL
  Filled 2019-07-19 (×3): qty 2

## 2019-07-19 MED ORDER — FENTANYL CITRATE (PF) 250 MCG/5ML IJ SOLN
INTRAMUSCULAR | Status: AC
  Filled 2019-07-19: qty 20

## 2019-07-19 MED ORDER — CHLORHEXIDINE GLUCONATE 0.12% ORAL RINSE (MEDLINE KIT): 15.0000 mL | Freq: Two times a day (BID) | OROMUCOSAL | Status: DC

## 2019-07-19 MED ORDER — INSULIN REGULAR BOLUS VIA INFUSION
0.0000 [IU] | Freq: Three times a day (TID) | INTRAVENOUS | Status: DC
  Filled 2019-07-19: qty 10

## 2019-07-19 MED ORDER — HEMOSTATIC AGENTS (NO CHARGE) OPTIME: TOPICAL | Status: DC | PRN

## 2019-07-19 MED ORDER — ARTIFICIAL TEARS OPHTHALMIC OINT
TOPICAL_OINTMENT | OPHTHALMIC | Status: AC
  Filled 2019-07-19: qty 3.5

## 2019-07-19 MED ORDER — LACTATED RINGERS IV SOLN: INTRAVENOUS | Status: DC

## 2019-07-19 MED ORDER — HEPARIN SODIUM (PORCINE) 1000 UNIT/ML IJ SOLN
INTRAMUSCULAR | Status: AC
  Filled 2019-07-19: qty 1

## 2019-07-19 MED ORDER — SODIUM CHLORIDE 0.9 % IV SOLN
1.5000 g | Freq: Two times a day (BID) | INTRAVENOUS | Status: AC
  Administered 2019-07-19 – 2019-07-20 (×3): 1.5 g via INTRAVENOUS
  Filled 2019-07-19 (×4): qty 1.5

## 2019-07-19 MED ORDER — MIDAZOLAM HCL 2 MG/2ML IJ SOLN: 2.0000 mg | INTRAMUSCULAR | Status: DC | PRN

## 2019-07-19 MED ORDER — PHENYLEPHRINE HCL-NACL 20-0.9 MG/250ML-% IV SOLN
0.0000 ug/min | INTRAVENOUS | Status: DC
  Administered 2019-07-20: 20 ug/min via INTRAVENOUS
  Administered 2019-07-21: 40 ug/min via INTRAVENOUS
  Filled 2019-07-19 (×2): qty 250

## 2019-07-19 MED ORDER — HEMOSTATIC AGENTS (NO CHARGE) OPTIME
TOPICAL | Status: DC | PRN
  Administered 2019-07-19: 1 via TOPICAL

## 2019-07-19 MED ORDER — STERILE WATER FOR INJECTION IJ SOLN
INTRAMUSCULAR | Status: AC
  Filled 2019-07-19: qty 10

## 2019-07-19 MED ORDER — SUCCINYLCHOLINE CHLORIDE 200 MG/10ML IV SOSY
PREFILLED_SYRINGE | INTRAVENOUS | Status: AC
  Filled 2019-07-19: qty 10

## 2019-07-19 MED ORDER — LACTATED RINGERS IV SOLN
INTRAVENOUS | Status: DC | PRN
  Administered 2019-07-19: 08:00:00 via INTRAVENOUS

## 2019-07-19 MED ORDER — SODIUM CHLORIDE 0.9 % IV SOLN: 250.0000 mL | INTRAVENOUS | Status: DC

## 2019-07-19 MED ORDER — MORPHINE SULFATE (PF) 2 MG/ML IV SOLN
1.0000 mg | INTRAVENOUS | Status: DC | PRN
  Administered 2019-07-19 (×2): 2 mg via INTRAVENOUS
  Filled 2019-07-19 (×2): qty 1

## 2019-07-19 MED ORDER — LIDOCAINE 2% (20 MG/ML) 5 ML SYRINGE
INTRAMUSCULAR | Status: DC | PRN
  Administered 2019-07-19: 100 mg via INTRAVENOUS

## 2019-07-19 MED ORDER — NITROGLYCERIN IN D5W 200-5 MCG/ML-% IV SOLN: 0.0000 ug/min | INTRAVENOUS | Status: DC

## 2019-07-19 MED ORDER — ACETAMINOPHEN 650 MG RE SUPP
650.0000 mg | Freq: Once | RECTAL | Status: AC
  Administered 2019-07-19: 650 mg via RECTAL

## 2019-07-19 MED ORDER — ORAL CARE MOUTH RINSE
15.0000 mL | Freq: Two times a day (BID) | OROMUCOSAL | Status: DC
  Administered 2019-07-19 – 2019-07-23 (×6): 15 mL via OROMUCOSAL

## 2019-07-19 MED ORDER — MILRINONE LACTATE IN DEXTROSE 20-5 MG/100ML-% IV SOLN: 0.1250 ug/kg/min | INTRAVENOUS | Status: DC

## 2019-07-19 MED ORDER — OXYCODONE HCL 5 MG PO TABS
5.0000 mg | ORAL_TABLET | ORAL | Status: DC | PRN
  Administered 2019-07-20 (×2): 10 mg via ORAL
  Filled 2019-07-19 (×2): qty 2

## 2019-07-19 MED ORDER — METOPROLOL TARTRATE 5 MG/5ML IV SOLN
2.5000 mg | INTRAVENOUS | Status: DC | PRN
  Administered 2019-07-22: 20:00:00 5 mg via INTRAVENOUS
  Filled 2019-07-19: qty 5

## 2019-07-19 MED ORDER — MIDAZOLAM HCL (PF) 10 MG/2ML IJ SOLN
INTRAMUSCULAR | Status: AC
  Filled 2019-07-19: qty 2

## 2019-07-19 MED ORDER — DEXMEDETOMIDINE HCL IN NACL 200 MCG/50ML IV SOLN: 0.0000 ug/kg/h | INTRAVENOUS | Status: DC

## 2019-07-19 MED ORDER — SODIUM CHLORIDE 0.9 % IV SOLN
INTRAVENOUS | Status: DC
  Administered 2019-07-19: 14:00:00 via INTRAVENOUS

## 2019-07-19 MED ORDER — VANCOMYCIN HCL 1000 MG IV SOLR
INTRAVENOUS | Status: DC | PRN
  Administered 2019-07-19: 13:00:00 1000 mL

## 2019-07-19 MED ORDER — SODIUM CHLORIDE (PF) 0.9 % IJ SOLN
INTRAMUSCULAR | Status: AC
  Filled 2019-07-19: qty 10

## 2019-07-19 MED ORDER — METOPROLOL TARTRATE 25 MG/10 ML ORAL SUSPENSION: 12.5000 mg | Freq: Two times a day (BID) | ORAL | Status: DC

## 2019-07-19 MED ORDER — ACETAMINOPHEN 500 MG PO TABS
1000.0000 mg | ORAL_TABLET | Freq: Four times a day (QID) | ORAL | Status: DC
  Administered 2019-07-20 – 2019-07-24 (×13): 1000 mg via ORAL
  Filled 2019-07-19 (×14): qty 2

## 2019-07-19 MED ORDER — INSULIN REGULAR(HUMAN) IN NACL 100-0.9 UT/100ML-% IV SOLN: INTRAVENOUS | Status: DC

## 2019-07-19 MED ORDER — NON FORMULARY
Status: DC | PRN
  Administered 2019-07-19: 30 mL via SURGICAL_CAVITY

## 2019-07-19 MED ORDER — ACETAMINOPHEN 160 MG/5ML PO SOLN: 1000.0000 mg | Freq: Four times a day (QID) | ORAL | Status: DC

## 2019-07-19 MED ORDER — SODIUM CHLORIDE 0.9 % IV SOLN
20.0000 ug | INTRAVENOUS | Status: AC
  Administered 2019-07-19: 20 ug via INTRAVENOUS
  Filled 2019-07-19: qty 5

## 2019-07-19 MED ORDER — PHENYLEPHRINE 40 MCG/ML (10ML) SYRINGE FOR IV PUSH (FOR BLOOD PRESSURE SUPPORT)
PREFILLED_SYRINGE | INTRAVENOUS | Status: DC | PRN
  Administered 2019-07-19 (×2): 80 ug via INTRAVENOUS
  Administered 2019-07-19: 40 ug via INTRAVENOUS

## 2019-07-19 MED ORDER — LACTATED RINGERS IV SOLN
500.0000 mL | Freq: Once | INTRAVENOUS | Status: AC | PRN
  Administered 2019-07-19: 500 mL via INTRAVENOUS

## 2019-07-19 MED ORDER — CHLORHEXIDINE GLUCONATE 0.12 % MT SOLN
15.0000 mL | OROMUCOSAL | Status: AC
  Administered 2019-07-19: 15 mL via OROMUCOSAL

## 2019-07-19 MED ORDER — ASPIRIN EC 325 MG PO TBEC: 325.0000 mg | DELAYED_RELEASE_TABLET | Freq: Every day | ORAL | Status: DC

## 2019-07-19 MED ORDER — LACTATED RINGERS IV SOLN
INTRAVENOUS | Status: DC | PRN
  Administered 2019-07-19 (×2): via INTRAVENOUS

## 2019-07-19 MED ORDER — STERILE WATER FOR INJECTION IJ SOLN
INTRAMUSCULAR | Status: DC | PRN
  Administered 2019-07-19: 20 mL via SURGICAL_CAVITY

## 2019-07-19 MED ORDER — BISACODYL 10 MG RE SUPP: 10.0000 mg | Freq: Every day | RECTAL | Status: DC

## 2019-07-19 MED ORDER — PHENYLEPHRINE 40 MCG/ML (10ML) SYRINGE FOR IV PUSH (FOR BLOOD PRESSURE SUPPORT)
PREFILLED_SYRINGE | INTRAVENOUS | Status: AC
  Filled 2019-07-19: qty 30

## 2019-07-19 MED ORDER — ACETAMINOPHEN 160 MG/5ML PO SOLN: 650.0000 mg | Freq: Once | ORAL | Status: AC

## 2019-07-19 MED ORDER — PANTOPRAZOLE SODIUM 40 MG PO TBEC
40.0000 mg | DELAYED_RELEASE_TABLET | Freq: Every day | ORAL | Status: DC
  Administered 2019-07-21 – 2019-07-24 (×4): 40 mg via ORAL
  Filled 2019-07-19 (×4): qty 1

## 2019-07-19 MED ORDER — SODIUM CHLORIDE 0.45 % IV SOLN
INTRAVENOUS | Status: DC | PRN
  Administered 2019-07-19: 15:00:00 via INTRAVENOUS

## 2019-07-19 MED ORDER — ORAL CARE MOUTH RINSE: 15.0000 mL | OROMUCOSAL | Status: DC

## 2019-07-19 MED ORDER — TRAMADOL HCL 50 MG PO TABS
50.0000 mg | ORAL_TABLET | ORAL | Status: DC | PRN
  Administered 2019-07-19: 100 mg via ORAL
  Filled 2019-07-19: qty 2

## 2019-07-19 MED ORDER — METOPROLOL TARTRATE 12.5 MG HALF TABLET
12.5000 mg | ORAL_TABLET | Freq: Two times a day (BID) | ORAL | Status: DC
  Administered 2019-07-20: 12.5 mg via ORAL
  Filled 2019-07-19 (×2): qty 1

## 2019-07-19 MED ORDER — ALBUMIN HUMAN 5 % IV SOLN
INTRAVENOUS | Status: DC | PRN
  Administered 2019-07-19 (×2): via INTRAVENOUS

## 2019-07-19 MED ORDER — BISACODYL 5 MG PO TBEC
10.0000 mg | DELAYED_RELEASE_TABLET | Freq: Every day | ORAL | Status: DC
  Administered 2019-07-20 – 2019-07-22 (×3): 10 mg via ORAL
  Filled 2019-07-19 (×3): qty 2

## 2019-07-19 MED ORDER — 0.9 % SODIUM CHLORIDE (POUR BTL) OPTIME
TOPICAL | Status: DC | PRN
  Administered 2019-07-19: 09:00:00 5000 mL

## 2019-07-19 MED ORDER — NOREPINEPHRINE 4 MG/250ML-% IV SOLN
0.0000 ug/min | INTRAVENOUS | Status: DC
  Filled 2019-07-19: qty 250

## 2019-07-19 MED ORDER — LIDOCAINE 2% (20 MG/ML) 5 ML SYRINGE
INTRAMUSCULAR | Status: AC
  Filled 2019-07-19: qty 5

## 2019-07-19 MED ORDER — MAGNESIUM SULFATE 4 GM/100ML IV SOLN
4.0000 g | Freq: Once | INTRAVENOUS | Status: AC
  Administered 2019-07-19: 4 g via INTRAVENOUS
  Filled 2019-07-19: qty 100

## 2019-07-19 MED ORDER — SODIUM CHLORIDE 0.9% FLUSH
3.0000 mL | Freq: Two times a day (BID) | INTRAVENOUS | Status: DC
  Administered 2019-07-20 – 2019-07-23 (×6): 3 mL via INTRAVENOUS

## 2019-07-19 MED ORDER — VANCOMYCIN HCL IN DEXTROSE 1-5 GM/200ML-% IV SOLN
1000.0000 mg | Freq: Once | INTRAVENOUS | Status: AC
  Administered 2019-07-19: 1000 mg via INTRAVENOUS
  Filled 2019-07-19: qty 200

## 2019-07-19 MED ORDER — SODIUM CHLORIDE 0.9% FLUSH: 3.0000 mL | INTRAVENOUS | Status: DC | PRN

## 2019-07-19 MED ORDER — PLASMA-LYTE 148 IV SOLN
INTRAVENOUS | Status: DC | PRN
  Administered 2019-07-19: 500 mL via INTRAVASCULAR

## 2019-07-19 MED ORDER — HEPARIN SODIUM (PORCINE) 1000 UNIT/ML IJ SOLN
INTRAMUSCULAR | Status: DC | PRN
  Administered 2019-07-19: 19000 [IU] via INTRAVENOUS

## 2019-07-19 MED ORDER — FAMOTIDINE IN NACL 20-0.9 MG/50ML-% IV SOLN
20.0000 mg | Freq: Two times a day (BID) | INTRAVENOUS | Status: AC
  Administered 2019-07-19 (×2): 20 mg via INTRAVENOUS
  Filled 2019-07-19: qty 50

## 2019-07-19 MED ORDER — PROTAMINE SULFATE 10 MG/ML IV SOLN
INTRAVENOUS | Status: DC | PRN
  Administered 2019-07-19: 20 mg via INTRAVENOUS
  Administered 2019-07-19: 150 mg via INTRAVENOUS

## 2019-07-19 MED ORDER — POTASSIUM CHLORIDE 10 MEQ/50ML IV SOLN
10.0000 meq | INTRAVENOUS | Status: AC
  Administered 2019-07-19 (×3): 10 meq via INTRAVENOUS

## 2019-07-19 MED ORDER — PROPOFOL 10 MG/ML IV BOLUS
INTRAVENOUS | Status: DC | PRN
  Administered 2019-07-19: 90 mg via INTRAVENOUS

## 2019-07-19 MED FILL — Water For Injection: INTRAMUSCULAR | Qty: 20 | Status: AC

## 2019-07-19 MED FILL — Indocyanine Green For IV Soln 25 MG: INTRAVENOUS | Qty: 25 | Status: AC

## 2019-07-19 MED FILL — Water For IV Injection: INTRAVENOUS | Qty: 1000 | Status: AC

## 2019-07-19 SURGICAL SUPPLY — 96 items
ADAPTER CARDIO PERF ANTE/RETRO (ADAPTER) ×3 IMPLANT
BAG DECANTER FOR FLEXI CONT (MISCELLANEOUS) ×3 IMPLANT
BASKET HEART (ORDER IN 25'S) (MISCELLANEOUS) ×1
BASKET HEART (ORDER IN 25S) (MISCELLANEOUS) ×2 IMPLANT
BLADE CLIPPER SURG (BLADE) IMPLANT
BLADE STERNUM SYSTEM 6 (BLADE) ×3 IMPLANT
BLOWER MISTER CAL-MED (MISCELLANEOUS) ×3 IMPLANT
BNDG ELASTIC 4X5.8 VLCR STR LF (GAUZE/BANDAGES/DRESSINGS) ×3 IMPLANT
BNDG ELASTIC 6X5.8 VLCR STR LF (GAUZE/BANDAGES/DRESSINGS) ×3 IMPLANT
BNDG GAUZE ELAST 4 BULKY (GAUZE/BANDAGES/DRESSINGS) ×3 IMPLANT
CANISTER SUCT 3000ML PPV (MISCELLANEOUS) ×3 IMPLANT
CATH CPB KIT HENDRICKSON (MISCELLANEOUS) ×3 IMPLANT
CATH HEART VENT LEFT (CATHETERS) ×2 IMPLANT
CATH ROBINSON RED A/P 18FR (CATHETERS) ×9 IMPLANT
CLIP RETRACTION 3.0MM CORONARY (MISCELLANEOUS) ×3 IMPLANT
CONN ST 1/4X3/8  BEN (MISCELLANEOUS) ×2
CONN ST 1/4X3/8 BEN (MISCELLANEOUS) ×4 IMPLANT
COVER PROBE W GEL 5X96 (DRAPES) ×3 IMPLANT
COVER WAND RF STERILE (DRAPES) IMPLANT
DERMABOND ADVANCED (GAUZE/BANDAGES/DRESSINGS) ×1
DERMABOND ADVANCED .7 DNX12 (GAUZE/BANDAGES/DRESSINGS) ×2 IMPLANT
DRAIN CHANNEL 28F RND 3/8 FF (WOUND CARE) ×9 IMPLANT
DRAPE CARDIOVASCULAR INCISE (DRAPES) ×1
DRAPE SLUSH/WARMER DISC (DRAPES) ×3 IMPLANT
DRAPE SRG 135X102X78XABS (DRAPES) ×2 IMPLANT
DRSG AQUACEL AG ADV 3.5X14 (GAUZE/BANDAGES/DRESSINGS) ×3 IMPLANT
ELECT BLADE 4.0 EZ CLEAN MEGAD (MISCELLANEOUS) ×3
ELECT CAUTERY BLADE 6.4 (BLADE) ×3 IMPLANT
ELECT REM PT RETURN 9FT ADLT (ELECTROSURGICAL) ×6
ELECTRODE BLDE 4.0 EZ CLN MEGD (MISCELLANEOUS) ×2 IMPLANT
ELECTRODE REM PT RTRN 9FT ADLT (ELECTROSURGICAL) ×4 IMPLANT
FELT TEFLON 1X6 (MISCELLANEOUS) ×3 IMPLANT
GAUZE SPONGE 4X4 12PLY STRL (GAUZE/BANDAGES/DRESSINGS) ×6 IMPLANT
GAUZE SPONGE 4X4 12PLY STRL LF (GAUZE/BANDAGES/DRESSINGS) ×6 IMPLANT
GLOVE BIO SURGEON STRL SZ7.5 (GLOVE) ×3 IMPLANT
GLOVE BIOGEL PI IND STRL 6.5 (GLOVE) ×2 IMPLANT
GLOVE BIOGEL PI INDICATOR 6.5 (GLOVE) ×1
GLOVE NEODERM STRL 7.5 LF PF (GLOVE) ×6 IMPLANT
GLOVE SURG NEODERM 7.5  LF PF (GLOVE) ×3
GLOVE SURG SS PI 8.0 STRL IVOR (GLOVE) ×6 IMPLANT
GOWN STRL REUS W/ TWL LRG LVL3 (GOWN DISPOSABLE) ×8 IMPLANT
GOWN STRL REUS W/TWL LRG LVL3 (GOWN DISPOSABLE) ×4
HEMOSTAT POWDER SURGIFOAM 1G (HEMOSTASIS) ×9 IMPLANT
HEMOSTAT SURGICEL 2X14 (HEMOSTASIS) ×3 IMPLANT
KIT BASIN OR (CUSTOM PROCEDURE TRAY) ×3 IMPLANT
KIT DRAINAGE VACCUM ASSIST (KITS) ×3 IMPLANT
KIT SUCTION CATH 14FR (SUCTIONS) ×3 IMPLANT
KIT TURNOVER KIT B (KITS) ×3 IMPLANT
KIT VASOVIEW HEMOPRO 2 VH 4000 (KITS) ×3 IMPLANT
LEAD PACING MYOCARDI (MISCELLANEOUS) ×3 IMPLANT
LINE VENT (MISCELLANEOUS) ×3 IMPLANT
MARKER GRAFT CORONARY BYPASS (MISCELLANEOUS) ×9 IMPLANT
NS IRRIG 1000ML POUR BTL (IV SOLUTION) ×15 IMPLANT
PACK E OPEN HEART (SUTURE) ×3 IMPLANT
PACK OPEN HEART (CUSTOM PROCEDURE TRAY) ×3 IMPLANT
PACK SPY-PHI (KITS) ×6 IMPLANT
PAD ARMBOARD 7.5X6 YLW CONV (MISCELLANEOUS) ×6 IMPLANT
PAD ELECT DEFIB RADIOL ZOLL (MISCELLANEOUS) ×3 IMPLANT
PENCIL BUTTON HOLSTER BLD 10FT (ELECTRODE) ×3 IMPLANT
POSITIONER HEAD DONUT 9IN (MISCELLANEOUS) ×3 IMPLANT
SEALANT PATCH FIBRIN 2X4IN (MISCELLANEOUS) ×3 IMPLANT
SEALANT SURG COSEAL 8ML (VASCULAR PRODUCTS) ×3 IMPLANT
SET CARDIOPLEGIA MPS 5001102 (MISCELLANEOUS) ×3 IMPLANT
STAPLER VISISTAT 35W (STAPLE) ×6 IMPLANT
SUT BONE WAX W31G (SUTURE) ×3 IMPLANT
SUT MNCRL AB 3-0 PS2 18 (SUTURE) ×6 IMPLANT
SUT PDS AB 1 CTX 36 (SUTURE) ×6 IMPLANT
SUT PROLENE 3 0 SH DA (SUTURE) ×3 IMPLANT
SUT PROLENE 4 0 SH DA (SUTURE) ×3 IMPLANT
SUT PROLENE 5 0 C 1 36 (SUTURE) IMPLANT
SUT PROLENE 6 0 C 1 30 (SUTURE) ×12 IMPLANT
SUT PROLENE 8 0 BV175 6 (SUTURE) IMPLANT
SUT PROLENE BLUE 7 0 (SUTURE) ×3 IMPLANT
SUT SILK  1 MH (SUTURE) ×2
SUT SILK 1 MH (SUTURE) ×4 IMPLANT
SUT SILK 2 0 SH CR/8 (SUTURE) IMPLANT
SUT SILK 3 0 SH CR/8 (SUTURE) IMPLANT
SUT STEEL 6MS V (SUTURE) ×6 IMPLANT
SUT STEEL SZ 6 DBL 3X14 BALL (SUTURE) ×3 IMPLANT
SUT VIC AB 2-0 CTX 27 (SUTURE) IMPLANT
SUT VIC AB 3-0 X1 27 (SUTURE) IMPLANT
SYR 10ML LL (SYRINGE) ×3 IMPLANT
SYSTEM HEARTSTRING SEAL 4.3 (VASCULAR PRODUCTS) ×2 IMPLANT
SYSTEM HEARTSTRING SEAL 4.3MM (VASCULAR PRODUCTS) ×1
SYSTEM SAHARA CHEST DRAIN ATS (WOUND CARE) ×6 IMPLANT
TAPE CLOTH SOFT 2X10 (GAUZE/BANDAGES/DRESSINGS) ×3 IMPLANT
TAPE CLOTH SURG 4X10 WHT LF (GAUZE/BANDAGES/DRESSINGS) ×6 IMPLANT
TAPES RETRACTO (MISCELLANEOUS) ×3 IMPLANT
TOWEL GREEN STERILE (TOWEL DISPOSABLE) ×3 IMPLANT
TOWEL GREEN STERILE FF (TOWEL DISPOSABLE) ×3 IMPLANT
TRAY FOLEY SLVR 16FR TEMP STAT (SET/KITS/TRAYS/PACK) ×3 IMPLANT
TUBING LAP HI FLOW INSUFFLATIO (TUBING) ×3 IMPLANT
UNDERPAD 30X30 (UNDERPADS AND DIAPERS) ×3 IMPLANT
VENT LEFT HEART 12002 (CATHETERS) ×3
WATER STERILE IRR 1000ML POUR (IV SOLUTION) ×6 IMPLANT
WATER STERILE IRR 1000ML UROMA (IV SOLUTION) ×3 IMPLANT

## 2019-07-19 NOTE — Progress Notes (Signed)
Pt passed all extubation parameters and requirements NIF -27 VC 485.  Extubated to 4 lpm

## 2019-07-19 NOTE — Brief Op Note (Signed)
07/17/2019 - 07/19/2019  7:51 AM  PATIENT:  Lacey Coleman  85 y.o. female  PRE-OPERATIVE DIAGNOSIS:  CAD  POST-OPERATIVE DIAGNOSIS:  CAD  PROCEDURE:  Procedure(s): CORONARY ARTERY BYPASS GRAFTING (CABG) x 2; Using Left Internal Mammary Artery (LIMA) and Right Leg Greater Saphenous Vein (SVG) harvested endoscopically; LIMA to LAD, SVG to Circ. (N/A) INDOCYANINE GREEN FLUORESCENCE IMAGING (ICG) (N/A) TRANSESOPHAGEAL ECHOCARDIOGRAM (TEE) (N/A)  LIMA-LAD SVG - CX  SURGEON:  Surgeon(s) and Role:    * Atkins, Glenice Bow, MD - Primary  PHYSICIAN ASSISTANT: WAYNE GOLD PA-C  ANESTHESIA:   general  EBL:  505 mL   BLOOD ADMINISTERED:see anesth/perfusion records  DRAINS: 2 PLEURAL AND 2 PERICARDIAL CHEST TUBES   LOCAL MEDICATIONS USED:  NONE  SPECIMEN:  No Specimen  DISPOSITION OF SPECIMEN:  N/A  COUNTS:  YES  TOURNIQUET:  * No tourniquets in log *  DICTATION: .Dragon Dictation  PLAN OF CARE: Admit to inpatient   PATIENT DISPOSITION:  ICU - intubated and hemodynamically stable.   Delay start of Pharmacological VTE agent (>24hrs) due to surgical blood loss or risk of bleeding: yes

## 2019-07-19 NOTE — Brief Op Note (Signed)
07/19/2019  2:13 PM  PATIENT:  Lacey Coleman  85 y.o. female  PRE-OPERATIVE DIAGNOSIS:  CAD  POST-OPERATIVE DIAGNOSIS:  CAD  PROCEDURE:  Procedure(s): CORONARY ARTERY BYPASS GRAFTING (CABG) x 2; Using Left Internal Mammary Artery (LIMA) and Right Leg Greater Saphenous Vein (SVG) harvested endoscopically; LIMA to LAD, SVG to Circ. (N/A) INDOCYANINE GREEN FLUORESCENCE IMAGING (ICG) (N/A) TRANSESOPHAGEAL ECHOCARDIOGRAM (TEE) (N/A)  SURGEON:  Surgeon(s) and Role:    * Wonda Olds, MD - Primary  PHYSICIAN ASSISTANT: Jadene Pierini PA-C  ASSISTANTS: none   ANESTHESIA:   general  EBL:  505 mL   BLOOD ADMINISTERED:350 CC PRBC  DRAINS: 4 Chest Tube(s) in the mediastinum and bilateral pleural spaces   LOCAL MEDICATIONS USED:  NONE  SPECIMEN:  No Specimen  DISPOSITION OF SPECIMEN:  N/A  COUNTS:  YES  TOURNIQUET:  * No tourniquets in log *  DICTATION: .Note written in EPIC  PLAN OF CARE: Admit to inpatient   PATIENT DISPOSITION:  ICU - intubated and hemodynamically stable.   Delay start of Pharmacological VTE agent (>24hrs) due to surgical blood loss or risk of bleeding: yes

## 2019-07-19 NOTE — Progress Notes (Signed)
      [id number]       [place] [address]             [phone]      S/p CABG x 2  Intubated, sedated  BP (!) 131/54   Pulse 89   Temp (!) 97.2 F (36.2 C)   Resp 14   Ht 5\' 6"  (1.676 m)   Wt 62.1 kg   SpO2 100%   BMI 22.10 kg/m   Intake/Output Summary (Last 24 hours) at 07/19/2019 1809 Last data filed at 07/19/2019 1357 Gross per 24 hour  Intake 3105 ml  Output 1405 ml  Net 1700 ml   38/19 CI = 2.3  Hgb 28, CBG well controlled  Stable early postop  Huma Imhoff C. Roxan Hockey, MD [facility] [phone]

## 2019-07-19 NOTE — Progress Notes (Signed)
Advanced Heart Failure Rounding Note  PCP-Cardiologist: No primary care provider on file.   Subjective:    Underwent 2v CABG today  (LIMA -> LAD, SVG -> OM) with Dr. Orvan Seen.   She is back in ICU. Intubated and sedated. Normal hemodynamics. Atrial paced. No bleeding from CTs. Making urine    Objective:   Weight Range: 62.1 kg Body mass index is 22.1 kg/m.   Vital Signs:   Temp:  [94.1 F (34.5 C)-98.5 F (36.9 C)] 97.9 F (36.6 C) (09/16 1900) Pulse Rate:  [12-91] 89 (09/16 1900) Resp:  [12-90] 15 (09/16 1900) BP: (99-146)/(51-75) 99/51 (09/16 1900) SpO2:  [94 %-100 %] 100 % (09/16 1900) Arterial Line BP: (106-149)/(45-61) 127/46 (09/16 1900) FiO2 (%):  [50 %] 50 % (09/16 1415) Weight:  [62.1 kg] 62.1 kg (09/16 0419) Last BM Date: 07/17/19  Weight change: Filed Weights   07/17/19 1512 07/18/19 0413 07/19/19 0419  Weight: 62.6 kg 61.6 kg 62.1 kg    Intake/Output:   Intake/Output Summary (Last 24 hours) at 07/19/2019 1914 Last data filed at 07/19/2019 1800 Gross per 24 hour  Intake 3717.28 ml  Output 1835 ml  Net 1882.28 ml      Physical Exam    General: intubated/sedated HEENT: normal + ETT Neck: supple.  RIJ swan. Carotids 2+ bilat; no bruits. No lymphadenopathy or thryomegaly appreciated. Cor: Sternal dressing Regular rate & rhythm. +CTs Lungs: coarse  Abdomen: soft, nontender, nondistended. Quiet Extremities: no cyanosis, clubbing, rash, 1+ edema Neuro: intubated/sedated   Telemetry   A paced Personally reviewed  Labs    CBC Recent Labs    07/17/19 0440  07/19/19 0258  07/19/19 1150  07/19/19 1247 07/19/19 1440  WBC 9.2   < > 5.8  --   --   --   --  11.5*  NEUTROABS 6.3  --   --   --   --   --   --   --   HGB 10.3*   < > 10.7*   < > 6.9*   < > 7.8* 8.5*  HCT 33.1*   < > 32.9*   < > 21.6*   < > 23.0* 28.0*  MCV 75.1*   < > 75.5*  --   --   --   --  80.5  PLT 257   < > 225  --  133*  --   --  180   < > = values in this interval not  displayed.   Basic Metabolic Panel Recent Labs    07/18/19 0733 07/19/19 0258  07/19/19 1221 07/19/19 1247  NA 138 138   < > 138 140  K 3.3* 3.7   < > 3.7 3.1*  CL 98 102  --   --   --   CO2 28 27  --   --   --   GLUCOSE 106* 99   < > 137* 113*  BUN 15 11  --   --   --   CREATININE 0.65 0.58  --   --   --   CALCIUM 9.5 9.4  --   --   --    < > = values in this interval not displayed.   Liver Function Tests No results for input(s): AST, ALT, ALKPHOS, BILITOT, PROT, ALBUMIN in the last 72 hours. No results for input(s): LIPASE, AMYLASE in the last 72 hours. Cardiac Enzymes No results for input(s): CKTOTAL, CKMB, CKMBINDEX, TROPONINI in the last 72 hours.  BNP: BNP (last 3 results) Recent Labs    07/16/19 0141  BNP 2,027.0*    ProBNP (last 3 results) No results for input(s): PROBNP in the last 8760 hours.   D-Dimer No results for input(s): DDIMER in the last 72 hours. Hemoglobin A1C No results for input(s): HGBA1C in the last 72 hours. Fasting Lipid Panel No results for input(s): CHOL, HDL, LDLCALC, TRIG, CHOLHDL, LDLDIRECT in the last 72 hours. Thyroid Function Tests No results for input(s): TSH, T4TOTAL, T3FREE, THYROIDAB in the last 72 hours.  Invalid input(s): FREET3  Other results:   Imaging    Dg Chest Port 1 View  Result Date: 07/19/2019 CLINICAL DATA:  Status post CABG EXAM: PORTABLE CHEST 1 VIEW COMPARISON:  07/16/2019 FINDINGS: Interval postoperative findings of median sternotomy and CABG. Esophagogastric tube projects with tip above the diaphragm at the left lung base, this position likely accounted for by patulousness of the esophagus and rotation although inadvertent bronchial intubation is not strictly excluded. Endotracheal tube is positioned with tip over the mid trachea. Right neck pulmonary arterial catheter, tip projecting over the right pulmonary artery. Bilateral chest and mediastinal tubes. No significant pneumothorax. Small bilateral pleural  effusions. IMPRESSION: 1.  Interval postoperative findings of median sternotomy and CABG. 2. Esophagogastric tube projects with tip above the diaphragm at the left lung base, this position likely accounted for by patulousness of the esophagus and rotation although inadvertent bronchial intubation is not strictly excluded. Recommend repositioning and advancement to ensure subdiaphragmatic position. 3.  Other support apparatus in appropriate position. 4.  Bilateral pleural effusions.  No significant pneumothorax. These results will be called to the ordering clinician or representative by the Radiologist Assistant, and communication documented in the PACS or zVision Dashboard. Electronically Signed   By: Eddie Candle M.D.   On: 07/19/2019 14:56     Medications:     Scheduled Medications:  [START ON 07/20/2019] acetaminophen  1,000 mg Oral Q6H   Or   [START ON 07/20/2019] acetaminophen (TYLENOL) oral liquid 160 mg/5 mL  1,000 mg Per Tube Q6H   amiodarone  200 mg Oral BID   [START ON 07/20/2019] aspirin EC  325 mg Oral Daily   Or   [START ON 07/20/2019] aspirin  324 mg Per Tube Daily   atorvastatin  40 mg Oral q1800   [START ON 07/20/2019] bisacodyl  10 mg Oral Daily   Or   [START ON 07/20/2019] bisacodyl  10 mg Rectal Daily   chlorhexidine gluconate (MEDLINE KIT)  15 mL Mouth Rinse BID   Chlorhexidine Gluconate Cloth  6 each Topical Daily   [START ON 07/20/2019] docusate sodium  200 mg Oral Daily   insulin regular  0-10 Units Intravenous TID WC   mouth rinse  15 mL Mouth Rinse 10 times per day   metoprolol tartrate  12.5 mg Oral BID   Or   metoprolol tartrate  12.5 mg Per Tube BID   [START ON 07/21/2019] pantoprazole  40 mg Oral Daily   [START ON 07/20/2019] sodium chloride flush  3 mL Intravenous Q12H    Infusions:  sodium chloride 10 mL/hr at 07/19/19 1800   [START ON 07/20/2019] sodium chloride     sodium chloride 20 mL/hr at 07/19/19 1415   albumin human 12.5 g (07/19/19  1600)   cefUROXime (ZINACEF)  IV     dexmedetomidine (PRECEDEX) IV infusion Stopped (07/19/19 1646)   epinephrine 1 mcg/min (07/19/19 1800)   famotidine (PEPCID) IV 20 mg (07/19/19 1415)   insulin  1.5 mL/hr at 07/19/19 1800   lactated ringers     lactated ringers 20 mL/hr at 07/19/19 1800   lactated ringers     magnesium sulfate 20 mL/hr at 07/19/19 1800   milrinone 0.125 mcg/kg/min (07/19/19 1800)   nitroGLYCERIN Stopped (07/19/19 1415)   phenylephrine (NEO-SYNEPHRINE) Adult infusion Stopped (07/19/19 1450)   vancomycin      PRN Medications: sodium chloride, albumin human, lactated ringers, metoprolol tartrate, midazolam, morphine injection, ondansetron (ZOFRAN) IV, oxyCODONE, [START ON 07/20/2019] sodium chloride flush, traMADol     Assessment/Plan   1. Acute coronary syndrome - Severe Multivessel Coronary Artery Disease - HS Trop 40>973>532 - LHC - distal LM lesion 90% stenosed, proximal circumflex 80% stenosis, proximal LAD to mid LAD 90% stenosed, first diagonal lesion 80% stenosed, mid circumflex 100% stenosed, proximal RCA 90% stenosed, and proximal RCA to mid RCA 100% stenosed. - s/p CABG x 2 (LIMA -> SVG -> OM) on 9/16 Luiz Blare numbers reviewed personally. Stable hemodynamics immediately post op - Maintaining NSR. Good u/o - Hopefully extubate soon - Wean drips and diurese as tolerated.  - ASA/statin   2. Acute Systolic HF, ICM  - 9/92 ECHO EF 30-35% RV ok  - LHC/RHC multivessel disease, CI 1.9, PCWP 25.  - s/p CABG 9/16. Stable hemodynamics - diurese as tolerated. Wean inotropes  3. Paroxysmal A fib RVR - pre-op - if recurs will need AC - continue amio     Length of Stay: 2  Glori Bickers, MD  07/19/2019, 7:14 PM  Advanced Heart Failure Team Pager [phone] (M-F; 7a - 4p)  Please contact [facility] for night-coverage after hours (4p -7a ) and weekends on [email or web address]

## 2019-07-19 NOTE — Transfer of Care (Signed)
Immediate Anesthesia Transfer of Care Note  Patient: Lacey Coleman  Procedure(s) Performed: CORONARY ARTERY BYPASS GRAFTING (CABG) x 2; Using Left Internal Mammary Artery (LIMA) and Right Leg Greater Saphenous Vein (SVG) harvested endoscopically; LIMA to LAD, SVG to Circ. (N/A Chest) INDOCYANINE GREEN FLUORESCENCE IMAGING (ICG) (N/A ) TRANSESOPHAGEAL ECHOCARDIOGRAM (TEE) (N/A )  Patient Location: SICU  Anesthesia Type:General  Level of Consciousness: Patient remains intubated per anesthesia plan  Airway & Oxygen Therapy: Patient remains intubated per anesthesia plan  Post-op Assessment: Report given to RN and Post -op Vital signs reviewed and stable  Post vital signs: Reviewed and stable  Last Vitals:  Vitals Value Taken Time  BP 131/54 07/19/19 1415  Temp 34.5 C 07/19/19 1432  Pulse 90 07/19/19 1432  Resp 12 07/19/19 1432  SpO2 100 % 07/19/19 1432  Vitals shown include unvalidated device data.  Last Pain:  Vitals:   07/19/19 0419  TempSrc: Oral  PainSc:          Complications: No apparent anesthesia complications

## 2019-07-19 NOTE — Progress Notes (Signed)
Patient able to lift head and follow commands alert and awake.  Started wean 40 and 4.

## 2019-07-19 NOTE — Progress Notes (Signed)
  Echocardiogram Echocardiogram Transesophageal has been performed.  Lacey Coleman 07/19/2019, 9:54 AM 

## 2019-07-19 NOTE — Anesthesia Procedure Notes (Addendum)
Procedure Name: Intubation Date/Time: 07/19/2019 9:12 AM Performed by: Harden Mo, CRNA Pre-anesthesia Checklist: Patient identified, Emergency Drugs available, Suction available and Patient being monitored Patient Re-evaluated:Patient Re-evaluated prior to induction Oxygen Delivery Method: Circle system utilized Preoxygenation: Pre-oxygenation with 100% oxygen Induction Type: IV induction Ventilation: Mask ventilation without difficulty Laryngoscope Size: Miller and 2 Grade View: Grade I Tube type: Oral Tube size: 8.0 mm Number of attempts: 1 Airway Equipment and Method: Stylet Placement Confirmation: ETT inserted through vocal cords under direct vision,  positive ETCO2 and breath sounds checked- equal and bilateral Secured at: 21 cm Dental Injury: Teeth and Oropharynx as per pre-operative assessment

## 2019-07-19 NOTE — Op Note (Signed)
CARDIOTHORACIC SURGERY OPERATIVE NOTE  Date of Procedure: 07/19/2019  Preoperative Diagnosis: Severe 3-vessel Coronary Artery Disease including LM CAD  Postoperative Diagnosis: Same  Procedure:    Coronary Artery Bypass Grafting x 2  Left Internal Mammary Artery to Distal Left Anterior Descending Coronary Artery; Reverse Saphenous Vein Graft to Posterior Descending Coronary Arteryndoscopic Vein Harvest from right Thigh; completion graft surveillance with indocyanine green fluorescence imaging; epiaortic ultrasonography  Surgeon: B. Murvin Natal, MD  Assistant: Jadene Pierini PA-C  Anesthesia: gen  Operative Findings:  Reduced left ventricular systolic function  Good quality left internal mammary artery conduit  Good  quality saphenous vein conduit  Good quality target vessels for grafting    BRIEF CLINICAL NOTE AND INDICATIONS FOR SURGERY  85 yo lady presented with several days history of chest pain and increasing SOB. Workup demonstrated CHF sx and LM CAD. She presents for CABG have weighed the risks and benefits carefully and after several discussions about these risks/benefits with the patient and family.    DETAILS OF THE OPERATIVE PROCEDURE  Preparation:  The patient is brought to the operating room on the above mentioned date and central monitoring was established by the anesthesia team including placement of Swan-Ganz catheter and radial arterial line. The patient is placed in the supine position on the operating table.  Intravenous antibiotics are administered. General endotracheal anesthesia is induced uneventfully. A Foley catheter is placed.  Baseline transesophageal echocardiogram was performed.  Findings were notable for mild-moderate AI, mild-mod MR; reduced LV function  The patient's chest, abdomen, both groins, and both lower extremities are prepared and draped in a sterile manner. A time out procedure is performed.   Surgical Approach and Conduit  Harvest:  A median sternotomy incision was performed and the left internal mammary artery is dissected from the chest wall and prepared for bypass grafting. The left internal mammary artery is notably good quality conduit. Simultaneously, the greater saphenous vein is obtained from the patient's right thigh using endoscopic vein harvest technique. The saphenous vein is notably good quality conduit. After removal of the saphenous vein, the small surgical incisions in the lower extremity are closed with absorbable suture. Following systemic heparinization, the left internal mammary artery was transected distally noted to have excellent flow.   Extracorporeal Cardiopulmonary Bypass and Myocardial Protection:  The pericardium is opened. The ascending aorta is mildly enlarged in appearance; from pre-op imaging, the aorta is suspected to be calcified. Epiaortic ultrasonography is performed, showing no mobile thrombi and not severely thickened aorta, but considering preoperative imaging, cross-clamping the aorta is deferred.  The ascending aorta and the right atrium are cannulated for cardiopulmonary bypass.  Adequate heparinization is verified.     The entire pre-bypass portion of the operation was notable for stable hemodynamics.  Cardiopulmonary bypass was begun and the surface of the heart is inspected. Distal target vessels are selected for coronary artery bypass grafting. Given aortic insufficiency detected by TEE, a left ventricular vent is placed through the right superior pulmonary vein.   The patient is allowed to cool passively to 34C systemic temperature.  A pacing wire is placed on the right ventricle and the heart is fibrillated to allow a relative stable/still surgical field.   Coronary Artery Bypass Grafting:   .The distal left anterior coronary artery was grafted with the left internal mammary artery in an end-to-side fashion.  At the site of distal anastomosis the target vessel was good  quality and measured approximately 1.5 mm in diameter.Anastomotic patency and runoff was  confirmed with indocyanine green fluorescence imaging (SPY).   The  obtuse marginal branch of the left circumflex coronary artery was grafted using a reversed saphenous vein graft in an end-to-side fashion.  At the site of distal anastomosis the target vessel was good quality and measured approximately 2 mm in diameter.Anastomotic patency and runoff was confirmed with indocyanine green fluorescence imaging (SPY).  The proximal vein graft anastomoses was placed directly to the ascending aorta using a Heartstring device.     Procedure Completion:  All proximal and distal coronary anastomoses were inspected for hemostasis and appropriate graft orientation. Epicardial pacing wires are fixed to the right ventricular outflow tract and to the right atrial appendage. The patient is rewarmed to 37C temperature. The patient is weaned and disconnected from cardiopulmonary bypass.  The patient's rhythm at separation from bypass was NSR.  The patient was weaned from cardiopulmonary bypass  with moderate  inotropic support.   Followup transesophageal echocardiogram performed after separation from bypass revealed  no changes from the preoperative exam.  The aortic and venous cannula were removed uneventfully. Protamine was administered to reverse the anticoagulation. The mediastinum and pleural space were inspected for hemostasis and irrigated with saline solution. The mediastinum and bilateral pleural space were drained using 28 Fr. Fluted chest tubes placed through separate stab incisions inferiorly.  The soft tissues anterior to the aorta were reapproximated loosely. The sternum is closed with double strength sternal wire. The soft tissues anterior to the sternum were closed in multiple layers and the skin is closed with a running subcuticular skin closure.  The post-bypass portion of the operation was notable for stable  rhythm and hemodynamics.  One unit of blood was administered during the operation.   Disposition:  The patient tolerated the procedure well and is transported to the surgical intensive care in stable condition. There are no intraoperative complications. All sponge instrument and needle counts are verified correct at completion of the operation.    Brantley Fling, MD 07/19/2019 4:24 PM

## 2019-07-19 NOTE — Anesthesia Procedure Notes (Signed)
Arterial Line Insertion Start/End9/16/2020 7:30 AM, 07/19/2019 7:35 AM Performed by: Amadeo Garnet, CRNA, CRNA  Patient location: Pre-op. Preanesthetic checklist: patient identified, IV checked, site marked, risks and benefits discussed, surgical consent, monitors and equipment checked, pre-op evaluation and timeout performed Lidocaine 1% used for infiltration and patient sedated Left, radial was placed Catheter size: 20 G Hand hygiene performed  and maximum sterile barriers used   Attempts: 1 Procedure performed without using ultrasound guided technique. Following insertion, dressing applied and Biopatch. Patient tolerated the procedure well with no immediate complications.

## 2019-07-19 NOTE — Anesthesia Postprocedure Evaluation (Signed)
Anesthesia Post Note  Patient: Lacey Coleman  Procedure(s) Performed: CORONARY ARTERY BYPASS GRAFTING (CABG) x 2; Using Left Internal Mammary Artery (LIMA) and Right Leg Greater Saphenous Vein (SVG) harvested endoscopically; LIMA to LAD, SVG to Circ. (N/A Chest) INDOCYANINE GREEN FLUORESCENCE IMAGING (ICG) (N/A ) TRANSESOPHAGEAL ECHOCARDIOGRAM (TEE) (N/A )     Patient location during evaluation: SICU Anesthesia Type: General Level of consciousness: sedated Pain management: pain level controlled Vital Signs Assessment: post-procedure vital signs reviewed and stable Respiratory status: patient remains intubated per anesthesia plan Cardiovascular status: stable Postop Assessment: no apparent nausea or vomiting Anesthetic complications: no    Last Vitals:  Vitals:   07/19/19 1630 07/19/19 1645  BP:    Pulse: 89 89  Resp: 12 14  Temp: (!) 36 C (!) 36.2 C  SpO2: 100% 100%    Last Pain:  Vitals:   07/19/19 0419  TempSrc: Oral  PainSc:                  Lacey Coleman

## 2019-07-19 NOTE — Procedures (Signed)
Extubation Procedure Note  Patient Details:   Name: Lacey Coleman DOB: Mar 06, 1931 MRN: [id number]   Airway Documentation:  Airway 8 mm (Active)  Secured at (cm) 22 cm 07/19/19 1951  Measured From Lips 07/19/19 1951  Secured Location Right 07/19/19 1951  Secured By Pink Tape 07/19/19 1951  Site Condition Cool 07/19/19 1951   Vent end date: (not recorded) Vent end time: (not recorded)   Evaluation  O2 sats: stable throughout   Complications: No apparent complications Patient did tolerate procedure well. Bilateral Breath Sounds: Clear, Diminished   Yes  Wyman Songster [facility] [place] 07/19/2019, 8:30 PM

## 2019-07-20 ENCOUNTER — Inpatient Hospital Stay (HOSPITAL_COMMUNITY): Payer: Medicare Other

## 2019-07-20 ENCOUNTER — Encounter (HOSPITAL_COMMUNITY): Payer: Self-pay | Admitting: Cardiothoracic Surgery

## 2019-07-20 DIAGNOSIS — I639 Cerebral infarction, unspecified: Secondary | ICD-10-CM

## 2019-07-20 LAB — BASIC METABOLIC PANEL
Anion gap: 8 (ref 5–15)
Anion gap: 10 (ref 5–15)
BUN: 9 mg/dL (ref 8–23)
BUN: 12 mg/dL (ref 8–23)
CO2: 23 mmol/L (ref 22–32)
CO2: 23 mmol/L (ref 22–32)
Calcium: 8.9 mg/dL (ref 8.9–10.3)
Calcium: 9.1 mg/dL (ref 8.9–10.3)
Chloride: 106 mmol/L (ref 98–111)
Chloride: 104 mmol/L (ref 98–111)
Creatinine, Ser: 0.56 mg/dL (ref 0.44–1.00)
Creatinine, Ser: 0.69 mg/dL (ref 0.44–1.00)
GFR calc Af Amer: >60 mL/min (ref >60)
GFR calc Af Amer: >60 mL/min (ref >60)
GFR calc non Af Amer: >60 mL/min (ref >60)
GFR calc non Af Amer: >60 mL/min (ref >60)
Glucose, Bld: 119 mg/dL — ABNORMAL HIGH (ref 70–99)
Glucose, Bld: 135 mg/dL — ABNORMAL HIGH (ref 70–99)
Potassium: 3.9 mmol/L (ref 3.5–5.1)
Potassium: 4.1 mmol/L (ref 3.5–5.1)
Sodium: 137 mmol/L (ref 135–145)
Sodium: 137 mmol/L (ref 135–145)

## 2019-07-20 LAB — GLUCOSE, CAPILLARY
Glucose-Capillary: 106 mg/dL — ABNORMAL HIGH (ref 70–99)
Glucose-Capillary: 107 mg/dL — ABNORMAL HIGH (ref 70–99)
Glucose-Capillary: 113 mg/dL — ABNORMAL HIGH (ref 70–99)
Glucose-Capillary: 114 mg/dL — ABNORMAL HIGH (ref 70–99)
Glucose-Capillary: 117 mg/dL — ABNORMAL HIGH (ref 70–99)
Glucose-Capillary: 123 mg/dL — ABNORMAL HIGH (ref 70–99)
Glucose-Capillary: 125 mg/dL — ABNORMAL HIGH (ref 70–99)
Glucose-Capillary: 130 mg/dL — ABNORMAL HIGH (ref 70–99)
Glucose-Capillary: 130 mg/dL — ABNORMAL HIGH (ref 70–99)
Glucose-Capillary: 136 mg/dL — ABNORMAL HIGH (ref 70–99)
Glucose-Capillary: 139 mg/dL — ABNORMAL HIGH (ref 70–99)

## 2019-07-20 LAB — CBC
HCT: 28.6 % — ABNORMAL LOW (ref 36.0–46.0)
HCT: 30.9 % — ABNORMAL LOW (ref 36.0–46.0)
Hemoglobin: 9 g/dL — ABNORMAL LOW (ref 12.0–15.0)
Hemoglobin: 9.7 g/dL — ABNORMAL LOW (ref 12.0–15.0)
MCH: 25.2 pg — ABNORMAL LOW (ref 26.0–34.0)
MCH: 25.1 pg — ABNORMAL LOW (ref 26.0–34.0)
MCHC: 31.5 g/dL (ref 30.0–36.0)
MCHC: 31.4 g/dL (ref 30.0–36.0)
MCV: 80.1 fL (ref 80.0–100.0)
MCV: 80.1 fL (ref 80.0–100.0)
Platelets: 197 10*3/uL (ref 150–400)
Platelets: 181 10*3/uL (ref 150–400)
RBC: 3.57 MIL/uL — ABNORMAL LOW (ref 3.87–5.11)
RBC: 3.86 MIL/uL — ABNORMAL LOW (ref 3.87–5.11)
RDW: 16.7 % — ABNORMAL HIGH (ref 11.5–15.5)
RDW: 16.9 % — ABNORMAL HIGH (ref 11.5–15.5)
WBC: 12.9 10*3/uL — ABNORMAL HIGH (ref 4.0–10.5)
WBC: 12 10*3/uL — ABNORMAL HIGH (ref 4.0–10.5)
nRBC: 0 % (ref 0.0–0.2)
nRBC: 0 % (ref 0.0–0.2)

## 2019-07-20 LAB — MAGNESIUM
Magnesium: 2.5 mg/dL — ABNORMAL HIGH (ref 1.7–2.4)
Magnesium: 2.2 mg/dL (ref 1.7–2.4)

## 2019-07-20 LAB — COOXEMETRY PANEL
Carboxyhemoglobin: 1.4 % (ref 0.5–1.5)
Methemoglobin: 1 % (ref 0.0–1.5)
O2 Saturation: 70.1 %
Total hemoglobin: 8.8 g/dL — ABNORMAL LOW (ref 12.0–16.0)

## 2019-07-20 MED ORDER — INSULIN ASPART 100 UNIT/ML ~~LOC~~ SOLN
0.0000 [IU] | SUBCUTANEOUS | Status: DC
  Administered 2019-07-20 – 2019-07-22 (×8): 2 [IU] via SUBCUTANEOUS
  Administered 2019-07-22: 4 [IU] via SUBCUTANEOUS

## 2019-07-20 MED ORDER — IOHEXOL 350 MG/ML SOLN
100.0000 mL | Freq: Once | INTRAVENOUS | Status: AC | PRN
  Administered 2019-07-20: 100 mL via INTRAVENOUS

## 2019-07-20 MED ORDER — STROKE: EARLY STAGES OF RECOVERY BOOK
Freq: Once | Status: AC
  Administered 2019-07-20: 22:00:00
  Filled 2019-07-20 (×2): qty 1

## 2019-07-20 MED ORDER — ACETAMINOPHEN 10 MG/ML IV SOLN
1000.0000 mg | Freq: Four times a day (QID) | INTRAVENOUS | Status: DC
  Administered 2019-07-21 (×2): 1000 mg via INTRAVENOUS
  Filled 2019-07-20 (×2): qty 100

## 2019-07-20 MED ORDER — AMIODARONE LOAD VIA INFUSION
150.0000 mg | Freq: Once | INTRAVENOUS | Status: AC
  Administered 2019-07-20: 150 mg via INTRAVENOUS
  Filled 2019-07-20: qty 83.34

## 2019-07-20 MED ORDER — AMIODARONE HCL IN DEXTROSE 360-4.14 MG/200ML-% IV SOLN
60.0000 mg/h | INTRAVENOUS | Status: DC
  Administered 2019-07-20 – 2019-07-21 (×2): 60 mg/h via INTRAVENOUS
  Filled 2019-07-20: qty 400

## 2019-07-20 MED ORDER — ASPIRIN EC 81 MG PO TBEC
81.0000 mg | DELAYED_RELEASE_TABLET | Freq: Every day | ORAL | Status: DC
  Administered 2019-07-20 – 2019-07-24 (×5): 81 mg via ORAL
  Filled 2019-07-20 (×5): qty 1

## 2019-07-20 MED ORDER — AMIODARONE HCL IN DEXTROSE 360-4.14 MG/200ML-% IV SOLN
30.0000 mg/h | INTRAVENOUS | Status: DC
  Administered 2019-07-21 (×3): 30 mg/h via INTRAVENOUS
  Filled 2019-07-20 (×2): qty 200

## 2019-07-20 MED ORDER — FUROSEMIDE 10 MG/ML IJ SOLN
40.0000 mg | Freq: Once | INTRAMUSCULAR | Status: AC
  Administered 2019-07-20: 40 mg via INTRAVENOUS
  Filled 2019-07-20: qty 4

## 2019-07-20 MED FILL — Magnesium Sulfate Inj 50%: INTRAMUSCULAR | Qty: 10 | Status: AC

## 2019-07-20 MED FILL — Heparin Sodium (Porcine) Inj 1000 Unit/ML: INTRAMUSCULAR | Qty: 30 | Status: AC

## 2019-07-20 MED FILL — Potassium Chloride Inj 2 mEq/ML: INTRAVENOUS | Qty: 40 | Status: AC

## 2019-07-20 NOTE — Progress Notes (Signed)
Assessed patient after change of shift. During my assessment I noticed that the patient was exhibiting dysarthria and aphasia. Code stroke initiated and CT surgery notified.

## 2019-07-20 NOTE — Addendum Note (Signed)
Addendum  created 07/20/19 2763 by Josephine Igo, CRNA   Order list changed

## 2019-07-20 NOTE — Progress Notes (Signed)
Pt went into afib RVR rates up to 140's. BP 120's-130's MAPs in the 70's. Bartle MD notified. See new orders for amio.

## 2019-07-20 NOTE — Progress Notes (Signed)
Advanced Heart Failure Rounding Note  PCP-Cardiologist: No primary care provider on file.   Subjective:    Underwent 2v CABG 9/16 (LIMA -> LAD, SVG -> OM) with Dr. Vickey Sages.   Extubated overnight. Sleepy but arousable and follows commands. On neo 10.    Atrial paced. No bleeding from CTs. Making urine  Weight up about 12 pounds   Objective:   Weight Range: 67.5 kg Body mass index is 24.02 kg/m.   Vital Signs:   Temp:  [94.1 F (34.5 C)-98.4 F (36.9 C)] 98.2 F (36.8 C) (09/17 0700) Pulse Rate:  [12-92] 77 (09/17 0700) Resp:  [12-90] 22 (09/17 0700) BP: (91-131)/(48-62) 91/48 (09/17 0700) SpO2:  [93 %-100 %] 95 % (09/17 0700) Arterial Line BP: (100-160)/(38-61) 100/38 (09/17 0700) FiO2 (%):  [40 %-50 %] 40 % (09/16 1951) Weight:  [67.5 kg] 67.5 kg (09/17 0254) Last BM Date: 07/17/19  Weight change: Filed Weights   07/19/19 0419 07/20/19 0000 07/20/19 0254  Weight: 62.1 kg 67.5 kg 67.5 kg    Intake/Output:   Intake/Output Summary (Last 24 hours) at 07/20/2019 0807 Last data filed at 07/20/2019 0700 Gross per 24 hour  Intake 5434.95 ml  Output 3735 ml  Net 1699.95 ml      Physical Exam    General:  Elderly pale. Sleepy but arousable HEENT: normal Neck: supple. RIJ swan Carotids 2+ bilat; no bruits. No lymphadenopathy or thryomegaly appreciated. Cor: Sternal dressing ok PMI nondisplaced. Regular rate & rhythm. +CTs Lungs: coarse Abdomen: soft, nontender, nondistended. No hepatosplenomegaly. Hypoactive BS Extremities: no cyanosis, clubbing, rash, 1+ edema Neuro: sleepy but arousable cranial nerves grossly intact. moves all 4 extremities w/o difficulty. Affect pleasant    Telemetry   A paced Personally reviewed  Labs    CBC Recent Labs    07/19/19 2015  07/19/19 2141 07/20/19 0449  WBC 11.2*  --   --  12.9*  HGB 9.1*   < > 7.8* 9.0*  HCT 28.2*   < > 23.0* 28.6*  MCV 78.8*  --   --  80.1  PLT 183  --   --  197   < > = values in this interval  not displayed.   Basic Metabolic Panel Recent Labs    32/35/57 0258  07/19/19 2015 07/19/19 2016 07/19/19 2141 07/20/19 0449  NA 138   < >  --  140 140 137  K 3.7   < >  --  3.9 3.9 3.9  CL 102  --   --  107  --  106  CO2 27  --   --   --   --  23  GLUCOSE 99   < >  --  124*  --  119*  BUN 11  --   --  9  --  9  CREATININE 0.58  --  0.51 0.40*  --  0.56  CALCIUM 9.4  --   --   --   --  8.9  MG  --   --  2.7*  --   --  2.5*   < > = values in this interval not displayed.   Liver Function Tests No results for input(s): AST, ALT, ALKPHOS, BILITOT, PROT, ALBUMIN in the last 72 hours. No results for input(s): LIPASE, AMYLASE in the last 72 hours. Cardiac Enzymes No results for input(s): CKTOTAL, CKMB, CKMBINDEX, TROPONINI in the last 72 hours.  BNP: BNP (last 3 results) Recent Labs    07/16/19 0141  BNP  2,027.0*    ProBNP (last 3 results) No results for input(s): PROBNP in the last 8760 hours.   D-Dimer No results for input(s): DDIMER in the last 72 hours. Hemoglobin A1C No results for input(s): HGBA1C in the last 72 hours. Fasting Lipid Panel No results for input(s): CHOL, HDL, LDLCALC, TRIG, CHOLHDL, LDLDIRECT in the last 72 hours. Thyroid Function Tests No results for input(s): TSH, T4TOTAL, T3FREE, THYROIDAB in the last 72 hours.  Invalid input(s): FREET3  Other results:   Imaging    Dg Chest Port 1 View  Result Date: 07/19/2019 CLINICAL DATA:  Status post CABG EXAM: PORTABLE CHEST 1 VIEW COMPARISON:  07/16/2019 FINDINGS: Interval postoperative findings of median sternotomy and CABG. Esophagogastric tube projects with tip above the diaphragm at the left lung base, this position likely accounted for by patulousness of the esophagus and rotation although inadvertent bronchial intubation is not strictly excluded. Endotracheal tube is positioned with tip over the mid trachea. Right neck pulmonary arterial catheter, tip projecting over the right pulmonary artery.  Bilateral chest and mediastinal tubes. No significant pneumothorax. Small bilateral pleural effusions. IMPRESSION: 1.  Interval postoperative findings of median sternotomy and CABG. 2. Esophagogastric tube projects with tip above the diaphragm at the left lung base, this position likely accounted for by patulousness of the esophagus and rotation although inadvertent bronchial intubation is not strictly excluded. Recommend repositioning and advancement to ensure subdiaphragmatic position. 3.  Other support apparatus in appropriate position. 4.  Bilateral pleural effusions.  No significant pneumothorax. These results will be called to the ordering clinician or representative by the Radiologist Assistant, and communication documented in the PACS or zVision Dashboard. Electronically Signed   By: Eddie Candle M.D.   On: 07/19/2019 14:56     Medications:     Scheduled Medications: . acetaminophen  1,000 mg Oral Q6H   Or  . acetaminophen (TYLENOL) oral liquid 160 mg/5 mL  1,000 mg Per Tube Q6H  . amiodarone  200 mg Oral BID  . aspirin EC  81 mg Oral Daily  . atorvastatin  40 mg Oral q1800  . bisacodyl  10 mg Oral Daily   Or  . bisacodyl  10 mg Rectal Daily  . Chlorhexidine Gluconate Cloth  6 each Topical Daily  . docusate sodium  200 mg Oral Daily  . insulin aspart  0-24 Units Subcutaneous Q4H  . mouth rinse  15 mL Mouth Rinse BID  . metoprolol tartrate  12.5 mg Oral BID   Or  . metoprolol tartrate  12.5 mg Per Tube BID  . [START ON 07/21/2019] pantoprazole  40 mg Oral Daily  . sodium chloride flush  3 mL Intravenous Q12H    Infusions: . sodium chloride 10 mL/hr at 07/20/19 0700  . sodium chloride    . sodium chloride 20 mL/hr at 07/19/19 1415  . albumin human 12.5 g (07/19/19 2123)  . cefUROXime (ZINACEF)  IV Stopped (07/19/19 2216)  . lactated ringers 20 mL/hr at 07/20/19 0700  . lactated ringers    . nitroGLYCERIN Stopped (07/19/19 1415)  . phenylephrine (NEO-SYNEPHRINE) Adult  infusion Stopped (07/20/19 0511)    PRN Medications: sodium chloride, albumin human, metoprolol tartrate, ondansetron (ZOFRAN) IV, sodium chloride flush     Assessment/Plan   1. Acute coronary syndrome - Severe Multivessel Coronary Artery Disease - HS Trop 29>562>130 - LHC - distal LM lesion 90% stenosed, proximal circumflex 80% stenosis, proximal LAD to mid LAD 90% stenosed, first diagonal lesion 80% stenosed, mid circumflex 100% stenosed,  proximal RCA 90% stenosed, and proximal RCA to mid RCA 100% stenosed. - s/p CABG x 2 (LIMA -> SVG -> OM) on 9/16 -> POD #! Ernestine Conrad numbers reviewed personally. Stable hemodynamics. Wean neo. Pull swan per TCTS - Maintaining NSR. Good u/o - Diurese gently - ASA/statin  - Mobilize  2. Acute Systolic HF, ICM  - 9/13 ECHO EF 30-35% RV ok  - LHC/RHC multivessel disease, CI 1.9, PCWP 25.  - s/p CABG 9/16. Stable hemodynamics - diurese as tolerated. Wean neo  3. Paroxysmal A fib RVR - pre-op - remains in NSR - if recurs will need AC - continue amio     Length of Stay: 3  Arvilla Meres, MD  07/20/2019, 8:07 AM  Advanced Heart Failure Team Pager [phone] (M-F; 7a - 4p)  Please contact [facility] for night-coverage after hours (4p -7a ) and weekends on [email or web address]

## 2019-07-20 NOTE — Progress Notes (Signed)
1 Day Post-Op Procedure(s) (LRB): CORONARY ARTERY BYPASS GRAFTING (CABG) x 2; Using Left Internal Mammary Artery (LIMA) and Right Leg Greater Saphenous Vein (SVG) harvested endoscopically; LIMA to LAD, SVG to Circ. (N/A) INDOCYANINE GREEN FLUORESCENCE IMAGING (ICG) (N/A) TRANSESOPHAGEAL ECHOCARDIOGRAM (TEE) (N/A) Subjective: No complaints  Objective: Vital signs in last 24 hours: Temp:  [94.1 F (34.5 C)-98.4 F (36.9 C)] 98.2 F (36.8 C) (09/17 0700) Pulse Rate:  [12-92] 77 (09/17 0700) Cardiac Rhythm: Normal sinus rhythm;Atrial paced (09/16 2000) Resp:  [12-90] 22 (09/17 0700) BP: (91-131)/(48-62) 91/48 (09/17 0700) SpO2:  [93 %-100 %] 95 % (09/17 0700) Arterial Line BP: (100-160)/(38-61) 100/38 (09/17 0700) FiO2 (%):  [40 %-50 %] 40 % (09/16 1951) Weight:  [67.5 kg] 67.5 kg (09/17 0254)  Hemodynamic parameters for last 24 hours: PAP: (25-52)/(10-31) 32/22 CO:  [2.2 L/min-4.2 L/min] 2.2 L/min CI:  [1.8 L/min/m2-4.7 L/min/m2] 3.8 L/min/m2  Intake/Output from previous day: 09/16 0701 - 09/17 0700 In: 5435 [I.V.:3934.5; Blood:205; IV Piggyback:1295.5] Out: 1610 [RUEAV:4098; Blood:505; Chest JXBJ:4782] Intake/Output this shift: No intake/output data recorded.  General appearance: alert and no distress Neurologic: intact Heart: regular rate and rhythm, S1, S2 normal, no murmur, click, rub or gallop Lungs: clear to auscultation bilaterally Extremities: extremities normal, atraumatic, no cyanosis or edema Wound: dressed/dry  Lab Results: Recent Labs    07/19/19 2015  07/19/19 2141 07/20/19 0449  WBC 11.2*  --   --  12.9*  HGB 9.1*   < > 7.8* 9.0*  HCT 28.2*   < > 23.0* 28.6*  PLT 183  --   --  197   < > = values in this interval not displayed.   BMET:  Recent Labs    07/19/19 0258  07/19/19 2016 07/19/19 2141 07/20/19 0449  NA 138   < > 140 140 137  K 3.7   < > 3.9 3.9 3.9  CL 102  --  107  --  106  CO2 27  --   --   --  23  GLUCOSE 99   < > 124*  --  119*   BUN 11  --  9  --  9  CREATININE 0.58   < > 0.40*  --  0.56  CALCIUM 9.4  --   --   --  8.9   < > = values in this interval not displayed.    PT/INR:  Recent Labs    07/19/19 1440  LABPROT 19.2*  INR 1.6*   ABG    Component Value Date/Time   PHART 7.383 07/19/2019 2141   HCO3 21.7 07/19/2019 2141   TCO2 23 07/19/2019 2141   ACIDBASEDEF 3.0 (H) 07/19/2019 2141   O2SAT 70.1 07/20/2019 0440   CBG (last 3)  Recent Labs    07/20/19 0347 07/20/19 0447 07/20/19 0549  GLUCAP 113* 114* 107*    Assessment/Plan: S/P Procedure(s) (LRB): CORONARY ARTERY BYPASS GRAFTING (CABG) x 2; Using Left Internal Mammary Artery (LIMA) and Right Leg Greater Saphenous Vein (SVG) harvested endoscopically; LIMA to LAD, SVG to Circ. (N/A) INDOCYANINE GREEN FLUORESCENCE IMAGING (ICG) (N/A) TRANSESOPHAGEAL ECHOCARDIOGRAM (TEE) (N/A) Mobilize Diabetes control remove pa catheter; oob to chair; AVOID narcotics/sedatives   LOS: 3 days    Wonda Olds 07/20/2019

## 2019-07-20 NOTE — Consult Note (Signed)
Neurology Consultation Reason for Consult: Aphasia Referring Physician: Bensimhon, D  CC: Aphasia  History is obtained from:Daughter, chart  HPI: Lacey Coleman is a 85 y.o. female who presented with Chest pain and underwent 2 vessel CABG yesterday morning. She had brief peri-operative afib at that time. Today, she was sleepy, but otherwise normal early in the day, then sometime between 1:30 and 2:00 she slept for a time. Since that time, she has had some difficulty with speech. She has also been lethargic and her speech difficulty was attributed to that. At shift change, she was noted to be aphasic and a code stroke was activated.    LKW: 1:30 pm  tpa given?: no, Surgery yesterday    ROS: Unable to obtain due to altered mental status.   Past Medical History:  Diagnosis Date  . CAD (coronary artery disease)    9/14 LHC with EF 25-35%, mod elevated LVEDP, dLM 90%, pLCx80%, pLAD to mLAD 90%, 1st diag 80%, mid Cx 100%, pRCA 90%, pRCA to mRCA 100%  . Heart failure with reduced ejection fraction (HCC)    a) 9/13 echo with EF 30-35%, mild LVF, diffuse hypokinesis, mod aortic calcification, mild AR  . Hypertension   . Rheumatoid arthritis (HCC)      History reviewed. No pertinent family history.   Social History:  reports that she has never smoked. She has never used smokeless tobacco. She reports previous alcohol use. She reports previous drug use.   Exam: Current vital signs: BP (!) 126/57   Pulse (!) 34   Temp 97.6 F (36.4 C) (Oral)   Resp 18   Ht 5\' 6"  (1.676 m)   Wt 67.5 kg   SpO2 98%   BMI 24.02 kg/m  Vital signs in last 24 hours: Temp:  [97.6 F (36.4 C)-98.4 F (36.9 C)] 97.6 F (36.4 C) (09/17 1500) Pulse Rate:  [34-92] 34 (09/17 1900) Resp:  [10-33] 18 (09/17 1900) BP: (91-131)/(48-65) 126/57 (09/17 1900) SpO2:  [91 %-100 %] 98 % (09/17 1900) Arterial Line BP: (58-160)/(37-61) 136/42 (09/17 1900) Weight:  [67.5 kg] 67.5 kg (09/17 0254)   Physical Exam   Constitutional: Appears well-developed and well-nourished.  Psych: Affect appropriate to situation Eyes: No scleral injection HENT: No OP obstrucion Head: Normocephalic.  Cardiovascular: Normal rate and regular rhythm.  Respiratory: Effort normal, non-labored breathing GI: Soft.  No distension. There is no tenderness.  Skin: WDI  Neuro: Mental Status: Patient is awake, alert, She has significant aphasia with perseveration. She is unable to name objects due to perseveration, but does follow simple commands and tells me her name(then perseverates on it).  Cranial Nerves: II: Blinks to threat bilaterally. Pupils are equal, round, and reactive to light.   III,IV, VI: EOMI without ptosis or diploplia.  V,VII: Facial movement with right facial droop.  VIII: hearing is intact to voice XII: tongue is midline without atrophy or fasciculations.  Motor: Tone is normal. Bulk is normal. 5/5 strength was present in all four extremities.  Sensory: She responds to mild stimulation x 4.  Cerebellar: No clear ataxia.    I have reviewed labs in epic and the results pertinent to this consultation are: Cr 0.69 A1c 5.5  I have reviewed the images obtained: CT head -well-defined stroke in the left head of the caudate.  CTA head and neck-negative  Impression: 85 yo F with acute aphasia in the setting of new onset A. fib.  I do suspect that she has had an ischemic stroke.  The character of her aphasia is unusual, and I do think it is possible that the head of the caudate stroke seen could be a symptomatic lesion, but it is unusual for her to appear so well-defined so quickly.  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  Recommendations: - fasting lipid panel - Frequent neuro checks - Echocardiogram - Prophylactic therapy-continue antiplatelet therapy with aspirin - Risk factor modification - Telemetry monitoring - PT consult, OT consult, Speech consult - Stroke team to follow   Roland Rack, MD [facility] [phone]  If 7pm- 7am, please page neurology on call as listed in [place].

## 2019-07-21 ENCOUNTER — Inpatient Hospital Stay (HOSPITAL_COMMUNITY): Payer: Medicare Other

## 2019-07-21 DIAGNOSIS — I340 Nonrheumatic mitral (valve) insufficiency: Secondary | ICD-10-CM

## 2019-07-21 LAB — GLUCOSE, CAPILLARY
Glucose-Capillary: 113 mg/dL — ABNORMAL HIGH (ref 70–99)
Glucose-Capillary: 114 mg/dL — ABNORMAL HIGH (ref 70–99)
Glucose-Capillary: 123 mg/dL — ABNORMAL HIGH (ref 70–99)
Glucose-Capillary: 130 mg/dL — ABNORMAL HIGH (ref 70–99)
Glucose-Capillary: 135 mg/dL — ABNORMAL HIGH (ref 70–99)
Glucose-Capillary: 144 mg/dL — ABNORMAL HIGH (ref 70–99)

## 2019-07-21 LAB — BASIC METABOLIC PANEL
Anion gap: 6 (ref 5–15)
BUN: 15 mg/dL (ref 8–23)
CO2: 23 mmol/L (ref 22–32)
Calcium: 9.3 mg/dL (ref 8.9–10.3)
Chloride: 104 mmol/L (ref 98–111)
Creatinine, Ser: 0.79 mg/dL (ref 0.44–1.00)
GFR calc Af Amer: >60 mL/min (ref >60)
GFR calc non Af Amer: >60 mL/min (ref >60)
Glucose, Bld: 117 mg/dL — ABNORMAL HIGH (ref 70–99)
Potassium: 3.9 mmol/L (ref 3.5–5.1)
Sodium: 133 mmol/L — ABNORMAL LOW (ref 135–145)

## 2019-07-21 LAB — CBC
HCT: 31 % — ABNORMAL LOW (ref 36.0–46.0)
Hemoglobin: 9.8 g/dL — ABNORMAL LOW (ref 12.0–15.0)
MCH: 24.8 pg — ABNORMAL LOW (ref 26.0–34.0)
MCHC: 31.6 g/dL (ref 30.0–36.0)
MCV: 78.5 fL — ABNORMAL LOW (ref 80.0–100.0)
Platelets: 245 10*3/uL (ref 150–400)
RBC: 3.95 MIL/uL (ref 3.87–5.11)
RDW: 17.5 % — ABNORMAL HIGH (ref 11.5–15.5)
WBC: 17.2 10*3/uL — ABNORMAL HIGH (ref 4.0–10.5)
nRBC: 0 % (ref 0.0–0.2)

## 2019-07-21 LAB — ECHOCARDIOGRAM COMPLETE
Height: 66 in
Weight: 2388.02 oz

## 2019-07-21 LAB — LIPID PANEL
Cholesterol: 93 mg/dL (ref 0–200)
HDL: 33 mg/dL — ABNORMAL LOW (ref >40)
LDL Cholesterol: 44 mg/dL (ref 0–99)
Total CHOL/HDL Ratio: 2.8 RATIO
Triglycerides: 81 mg/dL (ref <150)
VLDL: 16 mg/dL (ref 0–40)

## 2019-07-21 LAB — COOXEMETRY PANEL
Carboxyhemoglobin: 1.2 % (ref 0.5–1.5)
Methemoglobin: 1 % (ref 0.0–1.5)
O2 Saturation: 89.5 %
Total hemoglobin: 7.4 g/dL — ABNORMAL LOW (ref 12.0–16.0)

## 2019-07-21 MED ORDER — APIXABAN 5 MG PO TABS
5.0000 mg | ORAL_TABLET | Freq: Two times a day (BID) | ORAL | Status: DC
  Administered 2019-07-21 – 2019-07-24 (×7): 5 mg via ORAL
  Filled 2019-07-21 (×7): qty 1

## 2019-07-21 MED ORDER — LEVALBUTEROL HCL 0.63 MG/3ML IN NEBU
0.6300 mg | INHALATION_SOLUTION | Freq: Four times a day (QID) | RESPIRATORY_TRACT | Status: DC
  Administered 2019-07-21: 0.63 mg via RESPIRATORY_TRACT
  Filled 2019-07-21: qty 3

## 2019-07-21 MED ORDER — MAGNESIUM OXIDE 400 (241.3 MG) MG PO TABS
400.0000 mg | ORAL_TABLET | Freq: Two times a day (BID) | ORAL | Status: DC
  Administered 2019-07-21 – 2019-07-24 (×7): 400 mg via ORAL
  Filled 2019-07-21 (×7): qty 1

## 2019-07-21 MED ORDER — LEVALBUTEROL HCL 0.63 MG/3ML IN NEBU: 0.6300 mg | INHALATION_SOLUTION | Freq: Three times a day (TID) | RESPIRATORY_TRACT | Status: DC

## 2019-07-21 MED ORDER — PERFLUTREN LIPID MICROSPHERE
1.0000 mL | INTRAVENOUS | Status: AC | PRN
  Administered 2019-07-21: 4.5 mL via INTRAVENOUS
  Filled 2019-07-21 (×2): qty 10

## 2019-07-21 MED ORDER — LEVALBUTEROL HCL 0.63 MG/3ML IN NEBU: 0.6300 mg | INHALATION_SOLUTION | Freq: Four times a day (QID) | RESPIRATORY_TRACT | Status: DC | PRN

## 2019-07-21 MED ORDER — COLCHICINE 0.6 MG PO TABS
0.3000 mg | ORAL_TABLET | Freq: Two times a day (BID) | ORAL | Status: DC
  Administered 2019-07-21 – 2019-07-23 (×5): 0.3 mg via ORAL
  Filled 2019-07-21 (×6): qty 0.5

## 2019-07-21 MED ORDER — NOREPINEPHRINE 4 MG/250ML-% IV SOLN: 0.0000 ug/min | INTRAVENOUS | Status: DC

## 2019-07-21 MED ORDER — PERFLUTREN LIPID MICROSPHERE
1.0000 mL | INTRAVENOUS | Status: AC | PRN
  Filled 2019-07-21: qty 10

## 2019-07-21 MED FILL — Mannitol IV Soln 20%: INTRAVENOUS | Qty: 500 | Status: AC

## 2019-07-21 MED FILL — Calcium Chloride Inj 10%: INTRAVENOUS | Qty: 10 | Status: AC

## 2019-07-21 MED FILL — Electrolyte-R (PH 7.4) Solution: INTRAVENOUS | Qty: 4000 | Status: AC

## 2019-07-21 MED FILL — Lidocaine HCl Local Soln Prefilled Syringe 100 MG/5ML (2%): INTRAMUSCULAR | Qty: 5 | Status: AC

## 2019-07-21 MED FILL — Albumin, Human Inj 5%: INTRAVENOUS | Qty: 250 | Status: AC

## 2019-07-21 MED FILL — Sodium Bicarbonate IV Soln 8.4%: INTRAVENOUS | Qty: 50 | Status: AC

## 2019-07-21 MED FILL — Sodium Chloride IV Soln 0.9%: INTRAVENOUS | Qty: 3000 | Status: AC

## 2019-07-21 MED FILL — Heparin Sodium (Porcine) Inj 1000 Unit/ML: INTRAMUSCULAR | Qty: 10 | Status: AC

## 2019-07-21 NOTE — Progress Notes (Signed)
STROKE TEAM PROGRESS NOTE   INTERVAL HISTORY I have personally reviewed history of presenting illness, electronic medical records as well as imaging films in PACS.  Patient had cardiac bypass surgery and 2 days later developed aphasia likely due to small left MCA branch infarct not visualized on initial CT scan.  CT angiograms did not show significant large vessel stenosis or occlusion.  Patient remains with mild aphasia and paraphasic errors today but appears to be getting better.  Echocardiogram study is pending  Vitals:   07/21/19 1100 07/21/19 1102 07/21/19 1130 07/21/19 1137  BP: 124/75  116/67   Pulse: (!) 128 100    Resp: (!) 23 (!) 28    Temp:    97.8 F (36.6 C)  TempSrc:    Oral  SpO2: 100% 100%    Weight:      Height:        CBC:  Recent Labs  Lab 07/16/19 0617 07/17/19 0440  07/20/19 0449 07/20/19 1600  WBC 12.0* 9.2   < > 12.9* 12.0*  NEUTROABS 11.1* 6.3  --   --   --   HGB 11.4* 10.3*   < > 9.0* 9.7*  HCT 37.1 33.1*   < > 28.6* 30.9*  MCV 75.3* 75.1*   < > 80.1 80.1  PLT 279 257   < > 197 181   < > = values in this interval not displayed.    Basic Metabolic Panel:  Recent Labs  Lab 07/20/19 0449 07/20/19 1600 07/21/19 0444  NA 137 137 133*  K 3.9 4.1 3.9  CL 106 104 104  CO2 23 23 23   GLUCOSE 119* 135* 117*  BUN 9 12 15   CREATININE 0.56 0.69 0.79  CALCIUM 8.9 9.1 9.3  MG 2.5* 2.2  --    Lipid Panel:     Component Value Date/Time   CHOL 93 07/21/2019 0444   TRIG 81 07/21/2019 0444   HDL 33 (L) 07/21/2019 0444   CHOLHDL 2.8 07/21/2019 0444   VLDL 16 07/21/2019 0444   LDLCALC 44 07/21/2019 0444   HgbA1c:  Lab Results  Component Value Date   HGBA1C 5.5 07/16/2019   Urine Drug Screen: No results found for: LABOPIA, COCAINSCRNUR, LABBENZ, AMPHETMU, THCU, LABBARB  Alcohol Level No results found for: ETH  IMAGING Ct Angio Head W Or Wo Contrast  Result Date: 07/20/2019 CLINICAL DATA:  85 year old female with acute slurred speech, aphasia.  Status post CABG today. EXAM: CT ANGIOGRAPHY HEAD AND NECK CT PERFUSION BRAIN TECHNIQUE: Multidetector CT imaging of the head and neck was performed using the standard protocol during bolus administration of intravenous contrast. Multiplanar CT image reconstructions and MIPs were obtained to evaluate the vascular anatomy. Carotid stenosis measurements (when applicable) are obtained utilizing NASCET criteria, using the distal internal carotid diameter as the denominator. Multiphase CT imaging of the brain was performed following IV bolus contrast injection. Subsequent parametric perfusion maps were calculated using RAPID software. CONTRAST:  100mL OMNIPAQUE IOHEXOL 350 MG/ML SOLN COMPARISON:  None. FINDINGS: CT HEAD FINDINGS Brain: Degraded by motion. No intracranial mass effect, hemorrhage or ventriculomegaly. No definite acute cortically based infarct. Best seen on coronal series 8, image 29 is a lacunar infarct of the left caudate. Vascular: No suspicious intracranial vascular hyperdensity. Skull: Motion artifact, No acute osseous abnormality identified. Sinuses/Orbits: Visualized paranasal sinuses and mastoids are clear. Other: No acute orbit or scalp soft tissue finding. ASPECTS [facility]([place] Stroke Program Early CT Score) Total score (0-10 with 10 being normal): 429 (age  indeterminate left caudate lacune). Review of the MIP images confirms the above findings CT Brain Perfusion Findings: ASPECTS: 9 CBF (<30%) Volume: None Perfusion (Tmax>6.0s) volume: None Mismatch Volume: Not applicable Infarction Location:Not applicable CTA NECK Skeleton: Recent sternotomy. Degenerative changes in the cervical spine. Upper chest: Partially visible left chest tube. Small layering left pleural effusion and trace left apically pneumothorax. Trace right pleural effusion and right apically pneumothorax. Partially visible right chest tube. Minor right lung atelectasis. Postoperative changes to the mediastinum. Other neck: Gas in the neck  and at the thoracic inlet related to CABG. Marked thyromegaly. Severely enlarged left thyroid lobe which tracks cephalad to the level of the C3 vertebra encompasses 39 by 83 by 53 millimeters (AP by transverse by CC). Heterogeneous enhancement throughout the left lobe. The isthmus is enlarged at the thoracic inlet to 16 millimeters in thickness. The right lobe has a more normal size measuring 31 by 17 by 39 millimeters. Left neck regional mass effect from the thyroid enlargement, including lateral displacement of the left carotid space. No cervical lymphadenopathy. Aortic arch: Recent CABG. Calcified aortic atherosclerosis. 3 vessel arch configuration. Right carotid system: Right ICA origin and bulb atherosclerosis without stenosis. Tortuous right ICA distal to the bulb with mildly kinked appearance. Left carotid system: Left ICA origin calcified plaque without stenosis. Vertebral arteries: No proximal right subclavian artery stenosis. Calcified plaque adjacent to the right vertebral artery origin without stenosis. Patent right vertebral to the skull base without stenosis. No proximal left subclavian artery stenosis. Calcified plaque near the left vertebral artery origin without stenosis. Mildly dominant left vertebral artery. Tortuous left V1. Left vertebral is patent to the skull base without stenosis. CTA HEAD Posterior circulation: Dominant left V4 segment with calcified plaque but no significant stenosis. Patent vertebrobasilar junction. Patent PICA origins. Irregular basilar artery is patent without stenosis. Patent SCA and PCA origins. Posterior communicating arteries are diminutive or absent. Bilateral PCAs are irregular without stenosis. Anterior circulation: Both ICA siphons are patent. On the left there is a mild to moderate stenosis due to calcified plaque. On the right there is moderate to severe stenosis at the distal cavernous and supraclinoid segments due to calcified plaque (series 14, images 86 and  89). Patent carotid termini. Patent MCA and ACA origins. Bilateral ACA branches are within normal limits. Left MCA M1 segment and bifurcation are patent without stenosis. No left MCA branch occlusion is identified. Right MCA M1 and bifurcation are patent without stenosis. No right MCA branch occlusion is identified. Venous sinuses: Patent. Anatomic variants: Dominant left vertebral artery. Review of the MIP images confirms the above findings IMPRESSION: 1. Age indeterminate left caudate lacune, favor chronic. No intracranial hemorrhage or mass effect. 2. CT Perfusion does not detect infarct core or ischemic penumbra. 3. CTA is negative for large vessel occlusion. - moderate to severe right ICA siphon stenosis due to calcified plaque. - mild-to-moderate left ICA siphon stenosis. - no other significant arterial stenosis in the head or neck despite atherosclerosis. 4. Recent CABG with postoperative changes to the chest including trace pneumothoraces, chest tubes in place. 5. Marked thyroid goiter, the left lobe is 8.3 cm length. Preliminary report of the above was discussed by telephone with Dr. Ritta Slot on 07/20/2019 at 2104 hours, and final report was delayed due to unplanned radiology downtime. Electronically Signed   By: Odessa Fleming M.D.   On: 07/20/2019 21:58   Dg Chest 1 View  Result Date: 07/21/2019 CLINICAL DATA:  Shortness of breath EXAM: CHEST  1 VIEW COMPARISON:  Yesterday FINDINGS: Swan-Ganz catheter has been removed. CABG with cardiomegaly distorted by rotation, essentially unchanged. Small pleural effusions and atelectasis. Chest tube with no visible pneumothorax. IMPRESSION: Stable atelectasis and small pleural effusions. Electronically Signed   By: Marnee Spring M.D.   On: 07/21/2019 09:57   Ct Angio Neck W Or Wo Contrast  Result Date: 07/20/2019 CLINICAL DATA:  85 year old female with acute slurred speech, aphasia. Status post CABG today. EXAM: CT ANGIOGRAPHY HEAD AND NECK CT PERFUSION  BRAIN TECHNIQUE: Multidetector CT imaging of the head and neck was performed using the standard protocol during bolus administration of intravenous contrast. Multiplanar CT image reconstructions and MIPs were obtained to evaluate the vascular anatomy. Carotid stenosis measurements (when applicable) are obtained utilizing NASCET criteria, using the distal internal carotid diameter as the denominator. Multiphase CT imaging of the brain was performed following IV bolus contrast injection. Subsequent parametric perfusion maps were calculated using RAPID software. CONTRAST:  OMNIPAQUE IOHEXOL 350 MG/ML SOLN COMPARISON:  None. FINDINGS: CT HEAD FINDINGS Brain: Degraded by motion. No intracranial mass effect, hemorrhage or ventriculomegaly. No definite acute cortically based infarct. Best seen on coronal series 8, image 29 is a lacunar infarct of the left caudate. Vascular: No suspicious intracranial vascular hyperdensity. Skull: Motion artifact, No acute osseous abnormality identified. Sinuses/Orbits: Visualized paranasal sinuses and mastoids are clear. Other: No acute orbit or scalp soft tissue finding. ASPECTS [facility] Stroke Program Early CT Score) Total score (0-10 with 10 being normal): 52 (age indeterminate left caudate lacune). Review of the MIP images confirms the above findings CT Brain Perfusion Findings: ASPECTS: 9 CBF (<30%) Volume: None Perfusion (Tmax>6.0s) volume: None Mismatch Volume: Not applicable Infarction Location:Not applicable CTA NECK Skeleton: Recent sternotomy. Degenerative changes in the cervical spine. Upper chest: Partially visible left chest tube. Small layering left pleural effusion and trace left apically pneumothorax. Trace right pleural effusion and right apically pneumothorax. Partially visible right chest tube. Minor right lung atelectasis. Postoperative changes to the mediastinum. Other neck: Gas in the neck and at the thoracic inlet related to CABG. Marked thyromegaly. Severely  enlarged left thyroid lobe which tracks cephalad to the level of the C3 vertebra encompasses 39 by 83 by 53 millimeters (AP by transverse by CC). Heterogeneous enhancement throughout the left lobe. The isthmus is enlarged at the thoracic inlet to 16 millimeters in thickness. The right lobe has a more normal size measuring 31 by 17 by 39 millimeters. Left neck regional mass effect from the thyroid enlargement, including lateral displacement of the left carotid space. No cervical lymphadenopathy. Aortic arch: Recent CABG. Calcified aortic atherosclerosis. 3 vessel arch configuration. Right carotid system: Right ICA origin and bulb atherosclerosis without stenosis. Tortuous right ICA distal to the bulb with mildly kinked appearance. Left carotid system: Left ICA origin calcified plaque without stenosis. Vertebral arteries: No proximal right subclavian artery stenosis. Calcified plaque adjacent to the right vertebral artery origin without stenosis. Patent right vertebral to the skull base without stenosis. No proximal left subclavian artery stenosis. Calcified plaque near the left vertebral artery origin without stenosis. Mildly dominant left vertebral artery. Tortuous left V1. Left vertebral is patent to the skull base without stenosis. CTA HEAD Posterior circulation: Dominant left V4 segment with calcified plaque but no significant stenosis. Patent vertebrobasilar junction. Patent PICA origins. Irregular basilar artery is patent without stenosis. Patent SCA and PCA origins. Posterior communicating arteries are diminutive or absent. Bilateral PCAs are irregular without stenosis. Anterior circulation: Both ICA siphons are patent. On the  left there is a mild to moderate stenosis due to calcified plaque. On the right there is moderate to severe stenosis at the distal cavernous and supraclinoid segments due to calcified plaque (series 14, images 86 and 89). Patent carotid termini. Patent MCA and ACA origins. Bilateral ACA  branches are within normal limits. Left MCA M1 segment and bifurcation are patent without stenosis. No left MCA branch occlusion is identified. Right MCA M1 and bifurcation are patent without stenosis. No right MCA branch occlusion is identified. Venous sinuses: Patent. Anatomic variants: Dominant left vertebral artery. Review of the MIP images confirms the above findings IMPRESSION: 1. Age indeterminate left caudate lacune, favor chronic. No intracranial hemorrhage or mass effect. 2. CT Perfusion does not detect infarct core or ischemic penumbra. 3. CTA is negative for large vessel occlusion. - moderate to severe right ICA siphon stenosis due to calcified plaque. - mild-to-moderate left ICA siphon stenosis. - no other significant arterial stenosis in the head or neck despite atherosclerosis. 4. Recent CABG with postoperative changes to the chest including trace pneumothoraces, chest tubes in place. 5. Marked thyroid goiter, the left lobe is 8.3 cm length. Preliminary report of the above was discussed by telephone with Dr. Ritta Slot on 07/20/2019 at 2104 hours, and final report was delayed due to unplanned radiology downtime. Electronically Signed   By: Odessa Fleming M.D.   On: 07/20/2019 21:58   Ct Cerebral Perfusion W Contrast  Result Date: 07/20/2019 CLINICAL DATA:  85 year old female with acute slurred speech, aphasia. Status post CABG today. EXAM: CT ANGIOGRAPHY HEAD AND NECK CT PERFUSION BRAIN TECHNIQUE: Multidetector CT imaging of the head and neck was performed using the standard protocol during bolus administration of intravenous contrast. Multiplanar CT image reconstructions and MIPs were obtained to evaluate the vascular anatomy. Carotid stenosis measurements (when applicable) are obtained utilizing NASCET criteria, using the distal internal carotid diameter as the denominator. Multiphase CT imaging of the brain was performed following IV bolus contrast injection. Subsequent parametric perfusion  maps were calculated using RAPID software. CONTRAST:  OMNIPAQUE IOHEXOL 350 MG/ML SOLN COMPARISON:  None. FINDINGS: CT HEAD FINDINGS Brain: Degraded by motion. No intracranial mass effect, hemorrhage or ventriculomegaly. No definite acute cortically based infarct. Best seen on coronal series 8, image 29 is a lacunar infarct of the left caudate. Vascular: No suspicious intracranial vascular hyperdensity. Skull: Motion artifact, No acute osseous abnormality identified. Sinuses/Orbits: Visualized paranasal sinuses and mastoids are clear. Other: No acute orbit or scalp soft tissue finding. ASPECTS [facility] Stroke Program Early CT Score) Total score (0-10 with 10 being normal): 51 (age indeterminate left caudate lacune). Review of the MIP images confirms the above findings CT Brain Perfusion Findings: ASPECTS: 9 CBF (<30%) Volume: None Perfusion (Tmax>6.0s) volume: None Mismatch Volume: Not applicable Infarction Location:Not applicable CTA NECK Skeleton: Recent sternotomy. Degenerative changes in the cervical spine. Upper chest: Partially visible left chest tube. Small layering left pleural effusion and trace left apically pneumothorax. Trace right pleural effusion and right apically pneumothorax. Partially visible right chest tube. Minor right lung atelectasis. Postoperative changes to the mediastinum. Other neck: Gas in the neck and at the thoracic inlet related to CABG. Marked thyromegaly. Severely enlarged left thyroid lobe which tracks cephalad to the level of the C3 vertebra encompasses 39 by 83 by 53 millimeters (AP by transverse by CC). Heterogeneous enhancement throughout the left lobe. The isthmus is enlarged at the thoracic inlet to 16 millimeters in thickness. The right lobe has a more normal size measuring 31  by 17 by 39 millimeters. Left neck regional mass effect from the thyroid enlargement, including lateral displacement of the left carotid space. No cervical lymphadenopathy. Aortic arch: Recent  CABG. Calcified aortic atherosclerosis. 3 vessel arch configuration. Right carotid system: Right ICA origin and bulb atherosclerosis without stenosis. Tortuous right ICA distal to the bulb with mildly kinked appearance. Left carotid system: Left ICA origin calcified plaque without stenosis. Vertebral arteries: No proximal right subclavian artery stenosis. Calcified plaque adjacent to the right vertebral artery origin without stenosis. Patent right vertebral to the skull base without stenosis. No proximal left subclavian artery stenosis. Calcified plaque near the left vertebral artery origin without stenosis. Mildly dominant left vertebral artery. Tortuous left V1. Left vertebral is patent to the skull base without stenosis. CTA HEAD Posterior circulation: Dominant left V4 segment with calcified plaque but no significant stenosis. Patent vertebrobasilar junction. Patent PICA origins. Irregular basilar artery is patent without stenosis. Patent SCA and PCA origins. Posterior communicating arteries are diminutive or absent. Bilateral PCAs are irregular without stenosis. Anterior circulation: Both ICA siphons are patent. On the left there is a mild to moderate stenosis due to calcified plaque. On the right there is moderate to severe stenosis at the distal cavernous and supraclinoid segments due to calcified plaque (series 14, images 86 and 89). Patent carotid termini. Patent MCA and ACA origins. Bilateral ACA branches are within normal limits. Left MCA M1 segment and bifurcation are patent without stenosis. No left MCA branch occlusion is identified. Right MCA M1 and bifurcation are patent without stenosis. No right MCA branch occlusion is identified. Venous sinuses: Patent. Anatomic variants: Dominant left vertebral artery. Review of the MIP images confirms the above findings IMPRESSION: 1. Age indeterminate left caudate lacune, favor chronic. No intracranial hemorrhage or mass effect. 2. CT Perfusion does not detect  infarct core or ischemic penumbra. 3. CTA is negative for large vessel occlusion. - moderate to severe right ICA siphon stenosis due to calcified plaque. - mild-to-moderate left ICA siphon stenosis. - no other significant arterial stenosis in the head or neck despite atherosclerosis. 4. Recent CABG with postoperative changes to the chest including trace pneumothoraces, chest tubes in place. 5. Marked thyroid goiter, the left lobe is 8.3 cm length. Preliminary report of the above was discussed by telephone with Dr. Roland Rack on 07/20/2019 at 2104 hours, and final report was delayed due to unplanned radiology downtime. Electronically Signed   By: Genevie Ann M.D.   On: 07/20/2019 21:58   Dg Chest Port 1 View  Result Date: 07/20/2019 CLINICAL DATA:  85 year old female status post CABG. EXAM: PORTABLE CHEST 1 VIEW COMPARISON:  Chest radiograph dated 07/19/2019 FINDINGS: Interval removal of the endotracheal and enteric tubes. Swan-Ganz catheter appearing slightly more advanced since the prior radiograph and the tip projecting over the right infrahilar region. Clinical correlation is recommended. Bilateral chest tubes again noted. Small bilateral pleural effusions and bibasilar atelectasis although infiltrate is not excluded. There is mild vascular congestion. No pneumothorax. Stable enlarged cardiomediastinal silhouette. Median sternotomy wires. IMPRESSION: Interval removal of the endotracheal and enteric tubes. Swan-Ganz catheter appearing slightly more advanced since the prior radiograph and the tip projecting over the right infrahilar region. Electronically Signed   By: Anner Crete M.D.   On: 07/20/2019 08:51   Dg Chest Port 1 View  Result Date: 07/19/2019 CLINICAL DATA:  Status post CABG EXAM: PORTABLE CHEST 1 VIEW COMPARISON:  07/16/2019 FINDINGS: Interval postoperative findings of median sternotomy and CABG. Esophagogastric tube projects with tip above  the diaphragm at the left lung base, this  position likely accounted for by patulousness of the esophagus and rotation although inadvertent bronchial intubation is not strictly excluded. Endotracheal tube is positioned with tip over the mid trachea. Right neck pulmonary arterial catheter, tip projecting over the right pulmonary artery. Bilateral chest and mediastinal tubes. No significant pneumothorax. Small bilateral pleural effusions. IMPRESSION: 1.  Interval postoperative findings of median sternotomy and CABG. 2. Esophagogastric tube projects with tip above the diaphragm at the left lung base, this position likely accounted for by patulousness of the esophagus and rotation although inadvertent bronchial intubation is not strictly excluded. Recommend repositioning and advancement to ensure subdiaphragmatic position. 3.  Other support apparatus in appropriate position. 4.  Bilateral pleural effusions.  No significant pneumothorax. These results will be called to the ordering clinician or representative by the Radiologist Assistant, and communication documented in the PACS or zVision Dashboard. Electronically Signed   By: Lauralyn PrimesAlex  Bibbey M.D.   On: 07/19/2019 14:56    PHYSICAL EXAM Pleasant elderly Caucasian lady not in distress. . Afebrile. Head is nontraumatic. Neck is supple without bruit.    Cardiac exam no murmur or gallop. Lungs are clear to auscultation. Distal pulses are well felt.  She has chest tubes from recent cardiac surgery.  Midline sternal surgical scar from surgery Neurological Exam :  Awake alert mild expressive and receptive aphasia with verbal perseveration, difficulty in naming objects and repetition and following complex commands.  Occasional paraphasic errors.  Extraocular movements are full range without nystagmus.  She blinks to threat bilaterally.  Mild right lower facial weakness when she smiles.  Tongue midline.  Motor system exam able to move all 4 extremities well against gravity without focal weakness.  Sensation appears  preserved bilaterally reflexes 1+ symmetric plantars downgoing.  Gait not tested. ASSESSMENT/PLAN Ms. Timoteo GaulBetty J Coleman is a 85 y.o. female with history of CP and underwent 2 vessel CABG with brief peri-op AF who developed speech difficulty with expressive>receptive aphasia.   Stroke: Small left MCA branch  infarct embolic secondary to AF during CABG   CTA head & neck age indeterminate L caudate head lacune. No LVO - mod to severe R ICA siphon stenosis, mild to mod L ICA siphon stenosis. Recent CABG w/ post op changes. Market tyroid goiter, L lobe 8.3 cm length  CT perfusion no core infarct or ischemia   Repeat CT head pending  Carotid Doppler  Pre CABG doppler ok  2D Echo reduced ejection fraction 20 to 25% with diffuse hypokinesis more pronounced in the apical portions.    LDL 44  HgbA1c 5.5  Eliquis for VTE prophylaxis  aspirin 325 mg daily prior to admission, now on aspirin 81 mg daily and Eliquis (apixaban) daily.   Therapy recommendations:  pending   Disposition:  pending   Hypertension  Stable . Permissive hypertension (OK if < 220/120) but gradually normalize in 5-7 days . Long-term BP goal normotensive  Hyperlipidemia  Home meds:  lipitor 40, resumed in hospital  LDL 44, goal < 70  Continue statin at discharge  Other Stroke Risk Factors  Advanced age  Hx ETOH use, alcohol level No results found advised to drink no more than 1 drink(s) a day  Hx Substance abuse UDS:  THC No results found, Cocaine No results found   Coronary artery disease s/p CABG  Hx Congestive heart failure  Other Active Problems  RA  Hospital day # 4  I have personally obtained history,examined this patient, reviewed  notes, independently viewed imaging studies, participated in medical decision making and plan of care.ROS completed by me personally and pertinent positives fully documented  I have made any additions or clarifications directly to the above note.  Patient developed  aphasia 2 days after cardiac bypass surgery likely due to atrial fibrillation.  Agree with plan for anticoagulation with Eliquis.  Recommend speech therapy for swallow eval as well as language assessment.  Continue statin and aggressive risk factor modification.  Mobilize out of bed and therapy consults if stable from postop cardiac surgery standpoint.  No family available at the bedside for discussion.  Greater than 50% time during this 35-minute visit was spent on counseling and coordination of care about her embolic stroke and discussion with care team and answering questions stroke team will sign off.  Kindly call for questions.  Delia Heady, MD Medical Director [facility] Pager: [phone] 07/21/2019 3:41 PM   To contact Stroke Continuity provider, please refer to [email or web address]. After hours, contact General Neurology

## 2019-07-21 NOTE — Evaluation (Signed)
Clinical/Bedside Swallow Evaluation Patient Details  Name: Lacey Coleman MRN: [id number] Date of Birth: September 11, 1931  Today's Date: 07/21/2019 Time: SLP Start Time (ACUTE ONLY): 2671 SLP Stop Time (ACUTE ONLY): 0939 SLP Time Calculation (min) (ACUTE ONLY): 10 min  Past Medical History:  Past Medical History:  Diagnosis Date  . CAD (coronary artery disease)    9/14 LHC with EF 25-35%, mod elevated LVEDP, dLM 90%, pLCx80%, pLAD to mLAD 90%, 1st diag 80%, mid Cx 100%, pRCA 90%, pRCA to mRCA 100%  . Heart failure with reduced ejection fraction (HCC)    a) 9/13 echo with EF 30-35%, mild LVF, diffuse hypokinesis, mod aortic calcification, mild AR  . Hypertension   . Rheumatoid arthritis [facility])    Past Surgical History:  Past Surgical History:  Procedure Laterality Date  . CORONARY ARTERY BYPASS GRAFT N/A 07/19/2019   Procedure: CORONARY ARTERY BYPASS GRAFTING (CABG) x 2; Using Left Internal Mammary Artery (LIMA) and Right Leg Greater Saphenous Vein (SVG) harvested endoscopically; LIMA to LAD, SVG to Circ.;  Surgeon: Linden Dolin, MD;  Location: [facility] OR;  Service: Open Heart Surgery;  Laterality: N/A;  . RIGHT/LEFT HEART CATH AND CORONARY ANGIOGRAPHY N/A 07/17/2019   Procedure: RIGHT/LEFT HEART CATH AND CORONARY ANGIOGRAPHY;  Surgeon: Iran Ouch, MD;  Location: [facility] INVASIVE CV LAB;  Service: Cardiovascular;  Laterality: N/A;  . TEE WITHOUT CARDIOVERSION N/A 07/19/2019   Procedure: TRANSESOPHAGEAL ECHOCARDIOGRAM (TEE);  Surgeon: Linden Dolin, MD;  Location: [facility] OR;  Service: Open Heart Surgery;  Laterality: N/A;  . TONSILLECTOMY     HPI:  85 y.o. 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 She had brief peri-operative afib at that time.  Presented with aphasia later that day and code-stroke was activated.   CT head showed well-defined stroke in the head of the left caudate nucleus.    Assessment / Plan / Recommendation Clinical Impression  Pt presents with  functional oropharyngeal swallow.  Oral mechanism exam reveals symmetric features, no further facial asymmetry.  Tongue midline.  Pt with improved ability to follow commands.  Demonstrated adequate ability to feed herself applesauce and crackers, with adequate oral attention and mastication; brisk swallow response.  There were no s/s of aspiration with thin liquids, despite successive boluses >3 oz.  No dysphagia identified.  Recommend resuming a heart healthy diet; give meds whole in thin liquid.  No SLP f/u for swallowing is needed.  SLP Visit Diagnosis: Dysphagia, unspecified (R13.10)    Aspiration Risk  No limitations    Diet Recommendation   Heart Healthy, thin liquids  Medication Administration: Whole meds with liquid    Other  Recommendations Oral Care Recommendations: Oral care BID   Follow up Recommendations None      Frequency and Duration            Prognosis        Swallow Study   General Date of Onset: 07/20/19 HPI: 85 y.o. 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 She had brief peri-operative afib at that time.  Presented with aphasia later that day and code-stroke was activated.   CT head showed well-defined stroke in the head of the left caudate nucleus.  Type of Study: Bedside Swallow Evaluation Previous Swallow Assessment: no Diet Prior to this Study: NPO Temperature Spikes Noted: No Respiratory Status: Nasal cannula Behavior/Cognition: Alert;Cooperative Oral Cavity Assessment: Within Functional Limits Oral Care Completed by SLP: Recent completion by staff Oral Cavity -  Dentition: Adequate natural dentition Vision: Functional for self-feeding Self-Feeding Abilities: Able to feed self Patient Positioning: Upright in bed Baseline Vocal Quality: Normal Volitional Cough: Strong Volitional Swallow: Able to elicit    Oral/Motor/Sensory Function Overall Oral Motor/Sensory Function: Within functional limits   Ice Chips Ice chips:  Within functional limits   Thin Liquid Thin Liquid: Within functional limits    Nectar Thick Nectar Thick Liquid: Not tested   Honey Thick Honey Thick Liquid: Not tested   Puree Puree: Within functional limits   Solid     Solid: Within functional limits      Juan Quam Laurice 07/21/2019,9:51 AM   Estill Bamberg L. Tivis Ringer, [place] Office number [phone] Pager [phone]

## 2019-07-21 NOTE — Discharge Summary (Signed)
Physician Discharge Summary  Patient ID: Lacey Coleman MRN: [id number] DOB/AGE: 02/16/1931 85 y.o.  Admit date: 07/17/2019 Discharge date: 08/04/2019  Admission Diagnoses: Acute NSTEMI Coronary artery disease Rheumatoid arthritis Hypertension  Discharge Diagnoses:    Acute systolic heart failure (HCC)   Acute respiratory failure   Non-ST elevation (NSTEMI) myocardial infarction (HCC)   3-vessel CAD   Rheumatoid arthritis (HCC)   HTN    S/P CABG x 2   Expected acute blood loss anemia   Atrial fibrillation  Discharged Condition: stable  History of Present Illness:     85 yo lady in excellent health until past couple of weeks until she has noted increased peripheral edema. This past week she began to experience increase chest tightness and shortness of breath. She presented to the ED with these complaints, where noted to have NSTEMI. Underwent LHC showing LM CAD. Referred to [facility] for cabg workup. She has had intermittent chest pain, in retrospect, for several years. She also has experienced chest fluttering on occasion.  Hospital Course:  [name] was transferred from [facility] on a heparin infusion on 07/17/19 following left heart catheterization demonstrating multi-vessel CAD . She remained hemodynamically stable and free of symptoms. She developed atrial fibrillation on the night of admission that spontaneously converted back to SR.  She was started on oral amiodarone the following day. She was seen in consultation by Dr. Vickey Sages for consideration of CABG and was felt to be an acceptable candidate for the procedure. She elected to proceed with the surgery and was taken to the OR on 07/19/19 where 2-vessel CABG was performed without complication with LIMA -> LAD and SVG -> OM. Following surgery she was transferred to the cardiovascular ICU where she remained stable. She was extubated routinely on the day of surgery.  On the following day, she was weaned form vasopressor support and was mobilized  with PT assistance.  Amiodarone was continued as an infusion and she remained in NSR.  The heart failure team followed her post-op for management of her acute systolic CHF and ICM.  On post-op day one, in the afternoon, she developed some dificulty with speech and later became aphasic and lethargic. A Code Stroke was activated. CT angio and CT of the brain did not show convincing evidence for acute CVA or large vessel occlusion in the head or neck per the radilogist's report. The neurologist, however, was more suspicious of an acute caudate CVA .  By the following morning, her speech and lethargy had improved and she went on to make a progressive recovery. She developed post-op atrial fibrillation the converted back to SR with amiodarone. Her diet was advanced and well tolerated and she regained independent mobility.    Consults: neurology  Significant Diagnostic Studies:    ECHOCARDIOGRAM REPORT       Patient Name:   Lacey Coleman Date of Exam: 07/16/2019 Medical Rec #:  [id number]     Height:       65.0 in Accession #:    [id number]    Weight:       155.7 lb Date of Birth:  1931/01/04      BSA:          1.78 m Patient Age:    85 years      BP:           122/74 mmHg Patient Gender: F             HR:  104 bpm. Exam Location:  [facility]    Procedure: 2D Echo  Indications:     Congestive Heart Failure 428.0/ I50.9   History:         Patient has no prior history of Echocardiogram examinations.   Sonographer:     Arville Go RDCS Referring Phys:  [id number] [place] Diagnosing Phys: Lacey Coleman  IMPRESSIONS    1. The left ventricle has moderate-severely reduced systolic function, with an ejection fraction of 30-35%. The cavity size was normal. There is mildly increased left ventricular wall thickness. Left ventricular diastolic Doppler parameters are  consistent with impaired relaxation. Left ventricular diffuse hypokinesis.Unable to exclude regional wall motion  abnormality.  2. The right ventricle has normal systolic function. The cavity was normal. There is no increase in right ventricular wall thickness.Unable to estimate RVSP.  3. Moderate calcification of the aortic valve. Aortic valve regurgitation is mild  FINDINGS  Left Ventricle: The left ventricle has moderate-severely reduced systolic function, with an ejection fraction of 30-35%. The cavity size was normal. There is mildly increased left ventricular wall thickness. Left ventricular diastolic Doppler parameters  are consistent with impaired relaxation. Left ventricular diffuse hypokinesis.  Right Ventricle: The right ventricle has normal systolic function. The cavity was normal. There is no increase in right ventricular wall thickness.  Left Atrium: Left atrial size was normal in size.  Right Atrium: Right atrial size was normal in size. Right atrial pressure is estimated at 10 mmHg.  Interatrial Septum: No atrial level shunt detected by color flow Doppler.  Pericardium: There is no evidence of pericardial effusion.  Mitral Valve: The mitral valve is normal in structure. Mitral valve regurgitation is mild by color flow Doppler.  Tricuspid Valve: The tricuspid valve is normal in structure. Tricuspid valve regurgitation is mild by color flow Doppler.  Aortic Valve: The aortic valve is normal in structure. Moderate calcification of the aortic valve. Aortic valve regurgitation is mild by color flow Doppler. There is No stenosis of the aortic valve.  Pulmonic Valve: The pulmonic valve was grossly normal. Pulmonic valve regurgitation is not visualized by color flow Doppler.  Aorta: The aorta is normal unless otherwise noted.  Venous: The inferior vena cava is normal in size with greater than 50% respiratory variability.    +--------------+--------++ LEFT VENTRICLE              +----------------+---------++ +--------------+--------++      Diastology                 PLAX 2D                     +----------------+---------++ +--------------+--------++      LV e' lateral:  3.70 cm/s LVIDd:        5.90 cm       +----------------+---------++ +--------------+--------++      LV E/e' lateral:29.5      LVIDs:        4.80 cm       +----------------+---------++ +--------------+--------++      LV e' medial:   2.72 cm/s LV PW:        0.84 cm       +----------------+---------++ +--------------+--------++      LV E/e' medial: 40.1      LV IVS:       0.72 cm       +----------------+---------++ +--------------+--------++ LVOT diam:    1.90 cm  +--------------+--------++ LV SV:        66  ml    +--------------+--------++ LV SV Index:  36.23    +--------------+--------++ LVOT Area:    2.84 cm +--------------+--------++                        +--------------+--------++   +------------------+---------++ LV Volumes (MOD)            +------------------+---------++ LV area d, A2C:   29.70 cm +------------------+---------++ LV area d, A4C:   30.60 cm +------------------+---------++ LV area s, A2C:   24.20 cm +------------------+---------++ LV area s, A4C:   23.10 cm +------------------+---------++ LV major d, A2C:  6.67 cm   +------------------+---------++ LV major d, A4C:  7.56 cm   +------------------+---------++ LV major s, A2C:  6.73 cm   +------------------+---------++ LV major s, A4C:  6.78 cm   +------------------+---------++ LV vol d, MOD A2C:102.0 ml  +------------------+---------++ LV vol d, MOD A4C:99.3 ml   +------------------+---------++ LV vol s, MOD A2C:67.7 ml   +------------------+---------++ LV vol s, MOD A4C:62.8 ml   +------------------+---------++ LV SV MOD A2C:    34.3 ml   +------------------+---------++ LV SV MOD A4C:    99.3 ml   +------------------+---------++ LV SV MOD BP:     41.8 ml    +------------------+---------++  +---------------+----------++ RIGHT VENTRICLE           +---------------+----------++ RV Basal diam: 2.89 cm    +---------------+----------++ RV S prime:    12.80 cm/s +---------------+----------++ TAPSE (M-mode):1.5 cm     +---------------+----------++  +---------------+-------++-----------++ LEFT ATRIUM           Index       +---------------+-------++-----------++ LA diam:       3.30 cm1.86 cm/m  +---------------+-------++-----------++ LA Vol (A2C):  63.0 ml35.42 ml/m +---------------+-------++-----------++ LA Vol (A4C):  47.4 ml26.65 ml/m +---------------+-------++-----------++ LA Biplane Vol:56.0 ml31.49 ml/m +---------------+-------++-----------++ +------------+---------++-----------++ RIGHT ATRIUM         Index       +------------+---------++-----------++ RA Area:    15.00 cm            +------------+---------++-----------++ RA Volume:  40.30 ml 22.66 ml/m +------------+---------++-----------++  +------------+-----------++ AORTIC VALVE            +------------+-----------++ LVOT Vmax:  76.10 cm/s  +------------+-----------++ LVOT Vmean: 46.400 cm/s +------------+-----------++ LVOT VTI:   0.140 m     +------------+-----------++ AR PHT:     209 msec    +------------+-----------++   +-------------+-------++ AORTA                +-------------+-------++ Ao Root diam:3.10 cm +-------------+-------++ Ao Asc diam: 2.70 cm +-------------+-------++  +--------------+----------++ MITRAL VALVE              +--------------+-------+ +--------------+----------++  SHUNTS                MV Area (PHT):7.16 cm    +--------------+-------+ +--------------+----------++  Systemic VTI: 0.14 m  MV Peak grad: 7.2 mmHg    +--------------+-------+ +--------------+----------++  Systemic Diam:1.90 cm MV Mean grad: 3.0 mmHg     +--------------+-------+ +--------------+----------++ MV Vmax:      1.34 m/s   +--------------+----------++ MV Vmean:     83.5 cm/s  +--------------+----------++ MV VTI:       0.28 m     +--------------+----------++ MV PHT:       30.74 msec +--------------+----------++ MV Decel Time:106 msec   +--------------+----------++ +--------------+-----------++ MV E velocity:109.00 cm/s +--------------+-----------++ MV A velocity:106.00 cm/s +--------------+-----------++ MV E/A ratio: 1.03        +--------------+-----------++    Julien Nordmannimothy Gollan Coleman Electronically  signed by Julien Nordmann Coleman Signature Date/Time: 07/16/2019/12:58:46 PM    RIGHT/LEFT HEART CATH AND CORONARY ANGIOGRAPHY  Conclusion    The left ventricular ejection fraction is 25-35% by visual estimate.  There is moderate left ventricular systolic dysfunction.  LV end diastolic pressure is moderately elevated.  Dist LM lesion is 90% stenosed.  Prox Cx lesion is 80% stenosed.  Prox LAD to Mid LAD lesion is 90% stenosed.  1st Diag lesion is 80% stenosed.  Mid Cx lesion is 100% stenosed.  Prox RCA lesion is 90% stenosed.  Prox RCA to Mid RCA lesion is 100% stenosed.   1.  Severe three-vessel and left main coronary artery disease.  The coronary arteries are overall moderately to severely calcified. 2.  Moderately to severely reduced LV systolic function with an EF of 30 to 35%.  Left ventricular angiogram was not optimal due to PVCs and poor filling the an echo was done before the cath. 3.  Right heart catheterization showed moderately elevated filling pressures with mean wedge pressure of 25 mmHg, moderate pulmonary hypertension at 50/24 mmHg and moderately severely reduced cardiac output at 3.41 with a cardiac index of 1.95.  Recommendations: The patient was given 1 dose of IV furosemide during catheterization given some difficulty breathing and right heart cath findings.   Continue gentle diuresis and treatment for heart failure and coronary artery disease. Resume heparin drip 8 hours after sheath pull. Transfer [facility] for CABG evaluation.   Surgeon Notes    07/19/2019 4:34 PM Op Note signed by Linden Dolin, Coleman  Indications  Acute systolic heart failure (HCC) [I50.21 (ICD-10-CM)]  Non-ST elevation (NSTEMI) myocardial infarction (HCC) [I21.4 (ICD-10-CM)]  Procedural Details  Technical Details Procedural Details: The pre-existing IV in the right antecubital vein was exchanged under sterile fashion to a slender sheath. The right wrist was prepped, draped, and anesthetized with 1% lidocaine. Using the modified Seldinger technique, a 5 French sheath was introduced into the right radial artery. 2.5 mg of verapamil was administered through the sheath, weight-based unfractionated heparin was administered intravenously. Right heart catheterization was performed using a 5 French Swan-Ganz catheter. Cardiac output was calculated by the Fick method. A TIG catheter was used for selective coronary angiography and left ventriculography.  An AR mod catheter was needed to engage the right coronary artery.  Catheter exchanges were performed over an exchange length guidewire. There were no immediate procedural complications. A TR band was used for radial hemostasis at the completion of the procedure.  The patient was transferred to the post catheterization recovery area for further monitoring.  Estimated blood loss <50 mL.   During this procedure medications were administered to achieve and maintain moderate conscious sedation while the patient's heart rate, blood pressure, and oxygen saturation were continuously monitored and I was present face-to-face 100% of this time.  Medications (Filter: Administrations occurring from 07/17/19 0954 to 07/17/19 1053) (important)  Continuous medications are totaled by the amount administered until 07/17/19 1053.  Medication  Rate/Dose/Volume Action  Date Time   midazolam (VERSED) injection (mg) 1 mg Given 07/17/19 1000   Total dose as of 07/17/19 1053        1 mg        fentaNYL (SUBLIMAZE) injection (mcg) 25 mcg Given 07/17/19 1000   Total dose as of 07/17/19 1053        25 mcg        verapamil (ISOPTIN) injection (mg) 2.5 mg Given 07/17/19 1015   Total dose  as of 07/17/19 1053        2.5 mg        heparin injection (Units) 3,500 Units Given 07/17/19 1018   Total dose as of 07/17/19 1053        3,500 Units        Heparin (Porcine) in NaCl 2000-0.9 UNIT/L-% SOLN (mL) 500 mL Given 07/17/19 1019   Total dose as of 07/17/19 1053        500 mL        iohexol (OMNIPAQUE) 300 MG/ML solution (mL) 80 mL Given 07/17/19 1038   Total dose as of 07/17/19 1053        80 mL        furosemide (LASIX) injection (mg) 40 mg Given 07/17/19 1038   Total dose as of 07/17/19 1053        40 mg        aspirin EC tablet 325 mg (mg) *Not included in total Automatically Held 07/17/19 1000   Dosing weight:  70.6 kg        Total dose as of 07/17/19 1053        Cannot be calculated        Chlorhexidine Gluconate Cloth 2 % PADS 6 each (each) *Not included in total Automatically Held 07/17/19 1000   Dosing weight:  70.6 kg        Total dose as of 07/17/19 1053        Cannot be calculated        famotidine (PEPCID) tablet 20 mg (mg) *Not included in total Automatically Held 07/17/19 1000   Dosing weight:  70.6 kg        Total dose as of 07/17/19 1053        Cannot be calculated        lisinopril (ZESTRIL) tablet 5 mg (mg) *Not included in total Automatically Held 07/17/19 1000   Dosing weight:  70.6 kg        Total dose as of 07/17/19 1053        Cannot be calculated        MEDLINE mouth rinse (mL) *Not included in total Automatically Held 07/17/19 1000   Dosing weight:  70.6 kg        Total dose as of 07/17/19 1053        Cannot be calculated        Sedation Time  Sedation Time Physician-1: 34 minutes 55 seconds  Contrast   Medication Name Total Dose  iohexol (OMNIPAQUE) 300 MG/ML solution 80 mL    Radiation/Fluoro  Fluoro time: 8.4 (min) DAP: 25 (Gycm2) Cumulative Air Kerma: 364 (mGy)  Coronary Findings  Diagnostic Dominance: Right Left Main  Dist LM lesion 90% stenosed  Dist LM lesion is 90% stenosed.  Left Anterior Descending  Prox LAD to Mid LAD lesion 90% stenosed  Prox LAD to Mid LAD lesion is 90% stenosed.  First Diagonal Branch  Vessel is large in size.  1st Diag lesion 80% stenosed  1st Diag lesion is 80% stenosed.  Left Circumflex  Prox Cx lesion 80% stenosed  Prox Cx lesion is 80% stenosed.  Mid Cx lesion 100% stenosed  Mid Cx lesion is 100% stenosed.  First Obtuse Marginal Branch  Vessel is large in size.  Right Coronary Artery  Prox RCA lesion 90% stenosed  Prox RCA lesion is 90% stenosed.  Prox RCA to Mid RCA lesion 100% stenosed  Prox RCA to Mid RCA lesion is 100% stenosed.  Right Posterior Descending Artery  Collaterals  RPDA filled by collaterals from Dist LAD.    Intervention  No interventions have been documented. Wall Motion  Resting               Left Heart  Left Ventricle The left ventricle is mildly dilated. There is moderate left ventricular systolic dysfunction. LV end diastolic pressure is moderately elevated. The left ventricular ejection fraction is 25-35% by visual estimate. There are LV function abnormalities.  Coronary Diagrams  Diagnostic Dominance: Right     Treatments:   CARDIOTHORACIC SURGERY OPERATIVE NOTE  Date of Procedure:    07/19/2019  Preoperative Diagnosis:      Severe 3-vessel Coronary Artery Disease including LM CAD  Postoperative Diagnosis:    Same  Procedure:        Coronary Artery Bypass Grafting x 2             Left Internal Mammary Artery to Distal Left Anterior Descending Coronary Artery; Reverse Saphenous Vein Graft to Posterior Descending Coronary Arteryndoscopic Vein Harvest from right Thigh; completion  graft surveillance with indocyanine green fluorescence imaging; epiaortic ultrasonography  Surgeon:        B. Lorayne Marek, Coleman  Assistant:       Gershon Crane PA-C  Anesthesia:    gen  Operative Findings: ? Reduced left ventricular systolic function ? Good quality left internal mammary artery conduit ? Good  quality saphenous vein conduit ? Good quality target vessels for grafting    BRIEF CLINICAL NOTE AND INDICATIONS FOR SURGERY  85 yo lady presented with several days history of chest pain and increasing SOB. Workup demonstrated CHF sx and LM CAD. She presents for CABG have weighed the risks and benefits carefully and after several discussions about these risks/benefits with the patient and family.   Discharge Exam: Blood pressure 130/72, pulse 83, temperature 98 F (36.7 C), temperature source Oral, resp. rate 18, height  (1.676 m), weight 64.9 kg, SpO2 94 %.   Physical Exam General appearance: alert, cooperative and no distress Heart: regular rate and rhythm and occas extrasystole Lungs: mildly dim in bases Abdomen: benign Extremities: minor edema Wound: incis healing well  Disposition: Discharge disposition: 01-Home or Self Care      Discharge Instructions    Amb Referral to Cardiac Rehabilitation   Complete by: As directed    Diagnosis:  CABG NSTEMI     CABG X ___: 2   After initial evaluation and assessments completed: Virtual Based Care may be provided alone or in conjunction with Phase 2 Cardiac Rehab based on patient barriers.: Yes   Discharge patient   Complete by: As directed    Discharge disposition: 01-Home or Self Care   Discharge patient date: 07/24/2019     Allergies as of 07/24/2019   No Known Allergies     Medication List    STOP taking these medications   carvedilol 3.125 MG tablet Commonly known as: COREG   lisinopril 5 MG tablet Commonly known as: ZESTRIL   nitroGLYCERIN 0.4 MG SL tablet Commonly known as: NITROSTAT      TAKE these medications   acetaminophen 500 MG tablet Commonly known as: TYLENOL May take 1 tablet (500 mg total) by mouth every 6 (six) hours as needed for mild pain. May also take 1 tablet (500 mg total) every 6 (six) hours as needed for mild pain. Notes to patient: Take every 6 hours as needed for mild pain.   apixaban 5 MG Tabs tablet  Commonly known as: ELIQUIS Take 1 tablet (5 mg total) by mouth 2 (two) times daily. Notes to patient: Take this evening (9/21).   aspirin 81 MG EC tablet Take 1 tablet (81 mg total) by mouth daily. What changed:   medication strength  how much to take Notes to patient: Take tomorrow morning (9/22).   atorvastatin 40 MG tablet Commonly known as: LIPITOR Take 1 tablet (40 mg total) by mouth daily at 6 PM. Notes to patient: Take this evening (9/21) at 6pm.   furosemide 40 MG tablet Commonly known as: Lasix Take 1 tablet (40 mg total) by mouth daily.   metoprolol tartrate 25 MG tablet Commonly known as: LOPRESSOR Take 0.5 tablets (12.5 mg total) by mouth 2 (two) times daily. Notes to patient: Take this evening (9/21).      Follow-up Information    Sondra BargesDunn, Ryan M, PA-C. Go on 08/07/2019.   Specialties: Physician Assistant, Cardiology, Radiology Why: You have a cardiology follow up appointment with Mr. Eula ListenRyan Dunn on 08/07/19 at 10:30am. Contact information: [address] [place] [address] [phone]        [facility] Follow up on 08/03/2019.   Specialty: Cardiology Why: 9:40  Contact information: [address] [id number] [place] [id number] [phone]         The patient has been discharged on:   1.Beta Blocker:  Yes [ x  ]                              No   [   ]                              If No, reason:  2.Ace Inhibitor/ARB: Yes [ x  ]                                     No  [    ]                                     If No, reason:   3.Statin:   Yes [ x  ]                  No  [   ]                  If No, reason:  4.Ecasa:  Yes  [ x  ]                  No   [   ]                  If No, reason:   Signed: Leary RocaMyron G. Uriyah Massimo , PA-C 08/04/2019, 3:49 PM

## 2019-07-21 NOTE — Progress Notes (Signed)
2 Days Post-Op Procedure(s) (LRB): CORONARY ARTERY BYPASS GRAFTING (CABG) x 2; Using Left Internal Mammary Artery (LIMA) and Right Leg Greater Saphenous Vein (SVG) harvested endoscopically; LIMA to LAD, SVG to Circ. (N/A) INDOCYANINE GREEN FLUORESCENCE IMAGING (ICG) (N/A) TRANSESOPHAGEAL ECHOCARDIOGRAM (TEE) (N/A) Subjective: Feeling tired this am  Objective: Vital signs in last 24 hours: Temp:  [97.5 F (36.4 C)-98.2 F (36.8 C)] 97.5 F (36.4 C) (09/18 0300) Pulse Rate:  [34-120] 61 (09/18 0500) Cardiac Rhythm: Normal sinus rhythm (09/17 1930) Resp:  [10-29] 20 (09/18 0500) BP: (86-131)/(50-80) 124/56 (09/18 0500) SpO2:  [91 %-100 %] 99 % (09/18 0500) Arterial Line BP: (103-151)/(37-61) 128/46 (09/18 0500) Weight:  [67.7 kg] 67.7 kg (09/18 0449)  Hemodynamic parameters for last 24 hours: PAP: (34-39)/(27-31) 34/27  Intake/Output from previous day: 09/17 0701 - 09/18 0700 In: 885.4 [I.V.:685.4; IV Piggyback:200] Out: 2060 [Urine:960; Chest Tube:1100] Intake/Output this shift: No intake/output data recorded.  General appearance: alert, no distress and slowed mentation Neurologic: occasional word-finding difficulty Heart: irregularly irregular rhythm Lungs: clear to auscultation bilaterally Extremities: edema mild Wound: dressed, dry  Lab Results: Recent Labs    07/20/19 0449 07/20/19 1600  WBC 12.9* 12.0*  HGB 9.0* 9.7*  HCT 28.6* 30.9*  PLT 197 181   BMET:  Recent Labs    07/20/19 1600 07/21/19 0444  NA 137 133*  K 4.1 3.9  CL 104 104  CO2 23 23  GLUCOSE 135* 117*  BUN 12 15  CREATININE 0.69 0.79  CALCIUM 9.1 9.3    PT/INR:  Recent Labs    07/19/19 1440  LABPROT 19.2*  INR 1.6*   ABG    Component Value Date/Time   PHART 7.383 07/19/2019 2141   HCO3 21.7 07/19/2019 2141   TCO2 23 07/19/2019 2141   ACIDBASEDEF 3.0 (H) 07/19/2019 2141   O2SAT 89.5 07/21/2019 0447   CBG (last 3)  Recent Labs    07/20/19 1948 07/21/19 0001 07/21/19 0430   GLUCAP 130* 130* 113*    Assessment/Plan: S/P Procedure(s) (LRB): CORONARY ARTERY BYPASS GRAFTING (CABG) x 2; Using Left Internal Mammary Artery (LIMA) and Right Leg Greater Saphenous Vein (SVG) harvested endoscopically; LIMA to LAD, SVG to Circ. (N/A) INDOCYANINE GREEN FLUORESCENCE IMAGING (ICG) (N/A) TRANSESOPHAGEAL ECHOCARDIOGRAM (TEE) (N/A) Mobilize d/c tubes/lines continue to support BP in afib; echo   Encourage pulmonary toilet   LOS: 4 days    Wonda Olds 07/21/2019

## 2019-07-21 NOTE — Evaluation (Signed)
Speech Language Pathology Evaluation Patient Details Name: Lacey Coleman MRN: [id number] DOB: [birth year] Today's Date: 07/21/2019 Time: 0109-3235 SLP Time Calculation (min) (ACUTE ONLY): 11 min  Problem List:  Patient Active Problem List   Diagnosis Date Noted  . S/P CABG x 2 07/19/2019  . 3-vessel CAD 07/17/2019  . Rheumatoid arthritis (HCC) 07/17/2019  . HTN (hypertension) 07/17/2019  . Acute heart failure (HCC)   . Non-ST elevation (NSTEMI) myocardial infarction (HCC)   . Acute systolic heart failure (HCC) 07/16/2019  . Acute respiratory failure (HCC) 07/16/2019   Past Medical History:  Past Medical History:  Diagnosis Date  . CAD (coronary artery disease)    9/14 LHC with EF 25-35%, mod elevated LVEDP, dLM 90%, pLCx80%, pLAD to mLAD 90%, 1st diag 80%, mid Cx 100%, pRCA 90%, pRCA to mRCA 100%  . Heart failure with reduced ejection fraction (HCC)    a) 9/13 echo with EF 30-35%, mild LVF, diffuse hypokinesis, mod aortic calcification, mild AR  . Hypertension   . Rheumatoid arthritis [facility])    Past Surgical History:  Past Surgical History:  Procedure Laterality Date  . CORONARY ARTERY BYPASS GRAFT N/A 07/19/2019   Procedure: CORONARY ARTERY BYPASS GRAFTING (CABG) x 2; Using Left Internal Mammary Artery (LIMA) and Right Leg Greater Saphenous Vein (SVG) harvested endoscopically; LIMA to LAD, SVG to Circ.;  Surgeon: Linden Dolin, MD;  Location: [facility] OR;  Service: Open Heart Surgery;  Laterality: N/A;  . RIGHT/LEFT HEART CATH AND CORONARY ANGIOGRAPHY N/A 07/17/2019   Procedure: RIGHT/LEFT HEART CATH AND CORONARY ANGIOGRAPHY;  Surgeon: Iran Ouch, MD;  Location: [facility] INVASIVE CV LAB;  Service: Cardiovascular;  Laterality: N/A;  . TEE WITHOUT CARDIOVERSION N/A 07/19/2019   Procedure: TRANSESOPHAGEAL ECHOCARDIOGRAM (TEE);  Surgeon: Linden Dolin, MD;  Location: [facility] OR;  Service: Open Heart Surgery;  Laterality: N/A;  . TONSILLECTOMY     HPI:  85 y.o. female who presented with  chest pain and underwent 2 vessel CABG 9/16. She had brief peri-operative afib at that time.  Presented with aphasia later that day and code-stroke was activated.   CT head showed well-defined stroke in the head of the left caudate nucleus.    Assessment / Plan / Recommendation Clinical Impression  Pt presents with a fluent aphasia, improving from yesterday.  She is able to follow one step commands, more difficulty with two-step.  Yes/no reliability for complex/factual information 60% accurate.  Names to confrontation (objects in room) and can name item when offered a description (e.g, what do you use to comb your hair); but has break-down in word-retrieval when constructing novel sentences or listing items (divergent naming).  Expression in these circumstances is marked by frequent semantic and phonemic paraphasias.  Repetition of single words and simple sentences is intact; unable to repeat complex sentences (e.g, the spy fled to [place]).  Pt shows improving awareness of deficits.  Social communication, high-use phrases are more accurate (e.g., common social greetings/closings).  Fewer perseverative errors noted today.  Pt will benefit from further SLP f/u to address aphasia.  D/W RN.  Home environment unknown at this time - will reach out to family.     SLP Assessment  SLP Recommendation/Assessment: Patient needs continued Speech Lanaguage Pathology Services SLP Visit Diagnosis: Dysphagia, unspecified (R13.10)    Follow Up Recommendations  Other (comment)(tba)    Frequency and Duration min 2x/week  2 weeks      SLP Evaluation Cognition  Overall Cognitive Status: Impaired/Different from baseline Arousal/Alertness: Awake/alert Orientation  Level: Oriented to person;Oriented to place Attention: Sustained Sustained Attention: Appears intact Behaviors: Impulsive       Comprehension  Auditory Comprehension Overall Auditory Comprehension: Impaired Yes/No Questions: Impaired Complex  Questions: 50-74% accurate Commands: Impaired One Step Basic Commands: 75-100% accurate Two Step Basic Commands: 50-74% accurate Conversation: Simple Reading Comprehension Reading Status: (tba)    Expression Expression Primary Mode of Expression: Verbal Verbal Expression Overall Verbal Expression: Impaired Initiation: No impairment Automatic Speech: Name;Social Response;Counting Level of Generative/Spontaneous Verbalization: Sentence Repetition: Impaired Level of Impairment: Sentence level(complex sentences) Naming: Impairment Responsive: 76-100% accurate Confrontation: Within functional limits Divergent: 0-24% accurate Verbal Errors: Semantic paraphasias;Phonemic paraphasias Pragmatics: No impairment Written Expression Dominant Hand: Right Written Expression: Not tested   Oral / Motor  Oral Motor/Sensory Function Overall Oral Motor/Sensory Function: Within functional limits Motor Speech Overall Motor Speech: Appears within functional limits for tasks assessed   GO                    Juan Quam Laurice 07/21/2019, 10:02 AM  Estill Bamberg L. Tivis Ringer, [place] Office number [phone] Pager [phone]

## 2019-07-21 NOTE — Code Documentation (Signed)
85 yo female s/p CABG with new onset of speech difficulty. Code stroke initiated by Nursing staff. LSW 1330. NIHSS 4. Dr. Leonel Ramsay promptly came to bedside prior to CT. CTH/A/P done. Pt not a candidate for TPA due to window and surgery. Pt returned to [facility].

## 2019-07-21 NOTE — Progress Notes (Signed)
  Echocardiogram 2D Echocardiogram has been performed.  Lacey Coleman 07/21/2019, 9:19 AM

## 2019-07-21 NOTE — Evaluation (Signed)
Occupational Therapy Evaluation Patient Details Name: Lacey Coleman MRN: [id number] DOB: 12-02-1930 Today's Date: 07/21/2019    History of Present Illness 85 y.o. 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 She had brief peri-operative afib at that time.  Presented with aphasia later that day and code-stroke was activated.   CT head showed well-defined stroke in the head of the left caudate nucleus.   Clinical Impression   PTA Pt independent in ADL and mobility, has son that lives with her. Enjoys puzzles. Today Pt is min A +2 for transfers, overall min to max A for ADL. Initiated sternal precaution education, pt required cues throughout to maintain. Pt initially on 2L O2 at rest able to maintain at 96 and above on RA, but with activity dropped to low 80's - replaced 2L and SpO2 WFL. Pt will benefit from skilled OT at the acute setting and afterwards at the CIR level to maximize safety and independence in ADL and functional transfers and facilitate return to PLOF (independent).     Follow Up Recommendations  CIR;Supervision/Assistance - 24 hour    Equipment Recommendations  3 in 1 bedside commode;Other (comment)(defer to next venue of care)    Recommendations for Other Services Rehab consult     Precautions / Restrictions Precautions Precautions: Sternal;Fall;Other (comment) Precaution Booklet Issued: No(verbally reviewed) Precaution Comments: 3 chest tubes, a line, foley, central line, peripheral IV, O2 Restrictions Weight Bearing Restrictions: Yes(sternal) RUE Weight Bearing: (sternal) LUE Weight Bearing: (sternal) Other Position/Activity Restrictions: Sternal Precautions      Mobility Bed Mobility Overal bed mobility: Needs Assistance Bed Mobility: Supine to Sit;Sit to Supine     Supine to sit: Min assist;HOB elevated Sit to supine: Min assist;HOB elevated;+2 for safety/equipment   General bed mobility comments: assist to maintain sternal  precautions, +2 for line management  Transfers Overall transfer level: Needs assistance Equipment used: Ambulation equipment used(eva walker) Transfers: Sit to/from Stand Sit to Stand: Min assist;+2 safety/equipment         General transfer comment: cues for sternal precautions, assist to power up, +2 for line management    Balance Overall balance assessment: Needs assistance Sitting-balance support: No upper extremity supported;Feet supported Sitting balance-Leahy Scale: Good     Standing balance support: During functional activity;No upper extremity supported;Bilateral upper extremity supported Standing balance-Leahy Scale: Fair Standing balance comment: BUE support for amb                           ADL either performed or assessed with clinical judgement   ADL Overall ADL's : Needs assistance/impaired Eating/Feeding: Set up;Sitting;Cueing for compensatory techinques   Grooming: Moderate assistance;Cueing for compensatory techniques;Sitting Grooming Details (indicate cue type and reason): initiated education for sternal precautions Upper Body Bathing: Moderate assistance   Lower Body Bathing: Moderate assistance   Upper Body Dressing : Moderate assistance;Cueing for compensatory techniques   Lower Body Dressing: Maximal assistance   Toilet Transfer: Moderate assistance;+2 for safety/equipment;Ambulation(eva walker)   Toileting- Clothing Manipulation and Hygiene: Moderate assistance;Sit to/from stand       Functional mobility during ADLs: +2 for safety/equipment;Moderate assistance(eva walker) General ADL Comments: initiated education with sternal precautions and "move in the tube" terminology     Vision         Perception     Praxis      Pertinent Vitals/Pain Pain Assessment: Faces Faces Pain Scale: Hurts little more Pain Location: chest/incision Pain Descriptors / Indicators: Grimacing;Sore  Pain Intervention(s): Limited activity within  patient's tolerance;Repositioned;Monitored during session     Hand Dominance Right   Extremity/Trunk Assessment Upper Extremity Assessment Upper Extremity Assessment: Generalized weakness   Lower Extremity Assessment Lower Extremity Assessment: Defer to PT evaluation   Cervical / Trunk Assessment Cervical / Trunk Assessment: Other exceptions Cervical / Trunk Exceptions: s/p CABG   Communication Communication Communication: Expressive difficulties   Cognition Arousal/Alertness: Awake/alert Behavior During Therapy: WFL for tasks assessed/performed Overall Cognitive Status: Impaired/Different from baseline Area of Impairment: Orientation;Problem solving;Following commands;Safety/judgement                 Orientation Level: Disoriented to;Situation;Time     Following Commands: Follows multi-step commands inconsistently;Follows one step commands consistently Safety/Judgement: Decreased awareness of deficits;Decreased awareness of safety   Problem Solving: Difficulty sequencing General Comments: impulsive   General Comments  Pt on 2L O2 in bed with SpO2 100%. O2 removed to assess readiness to wean. SpO2 96% EOB on RA. Desat to 83% during amb. Returned to 2L in room with immediate increase in SpO2 to 100%.    Exercises     Shoulder Instructions      Home Living Family/patient expects to be discharged to:: Private residence Living Arrangements: Alone Available Help at Discharge: Family;Available 24 hours/day Type of Home: House Home Access: Stairs to enter [employer] of Steps: 2 Entrance Stairs-Rails: Right;Left Home Layout: One level     Bathroom Shower/Tub: Tub only   Biochemist, clinical: Standard     Home Equipment: Wheelchair - [employer] - 2 wheels(from husband - family has to check if they still have them)   Additional Comments: equipment belonged to her husband      Prior Functioning/Environment Level of Independence: Independent                  OT Problem List: Decreased activity tolerance;Impaired balance (sitting and/or standing);Decreased cognition;Decreased safety awareness;Decreased knowledge of use of DME or AE;Decreased knowledge of precautions;Cardiopulmonary status limiting activity;Pain      OT Treatment/Interventions: Self-care/ADL training;Energy conservation;DME and/or AE instruction;Therapeutic activities;Cognitive remediation/compensation;Patient/family education;Balance training    OT Goals(Current goals can be found in the care plan section) Acute Rehab OT Goals Patient Stated Goal: return to PLOF OT Goal Formulation: With patient Time For Goal Achievement: 08/04/19 Potential to Achieve Goals: Good ADL Goals Pt Will Perform Grooming: with supervision;standing Pt Will Perform Upper Body Dressing: with set-up;sitting Pt Will Perform Lower Body Dressing: with supervision;sit to/from stand Pt Will Transfer to Toilet: with supervision;ambulating Pt Will Perform Toileting - Clothing Manipulation and hygiene: with supervision;sit to/from stand Additional ADL Goal #1: Pt will verbalize sternal precautions and maintain during ADL routine at supervision level Additional ADL Goal #2: Pt will perform bed mobility at supervision level prior to engaging ADL  OT Frequency: Min 2X/week   Barriers to D/C:            Co-evaluation PT/OT/SLP Co-Evaluation/Treatment: Yes Reason for Co-Treatment: Complexity of the patient's impairments (multi-system involvement);For patient/therapist safety;To address functional/ADL transfers;Necessary to address cognition/behavior during functional activity PT goals addressed during session: Mobility/safety with mobility;Balance;Proper use of DME OT goals addressed during session: ADL's and self-care;Proper use of Adaptive equipment and DME      AM-PAC OT "6 Clicks" Daily Activity     Outcome Measure Help from another person eating meals?: A Little Help from another person  taking care of personal grooming?: A Little Help from another person toileting, which includes using toliet, bedpan, or urinal?: A Lot Help from another person  bathing (including washing, rinsing, drying)?: A Lot Help from another person to put on and taking off regular upper body clothing?: A Lot Help from another person to put on and taking off regular lower body clothing?: A Lot 6 Click Score: 14   End of Session Equipment Utilized During Treatment: Oxygen(eva walker) Nurse Communication: Mobility status;Precautions  Activity Tolerance: Patient tolerated treatment well Patient left: in bed;with call bell/phone within reach;with family/visitor present;with nursing/sitter in room  OT Visit Diagnosis: Unsteadiness on feet (R26.81);Other abnormalities of gait and mobility (R26.89);Muscle weakness (generalized) (M62.81);Other symptoms and signs involving the nervous system (R29.898);Other symptoms and signs involving cognitive function                Time: 2620-3559 OT Time Calculation (min): 38 min Charges:  OT General Charges $OT Visit: 1 Visit OT Evaluation $OT Eval Moderate Complexity: 1 Mod  Sherryl Manges OTR/L Acute Rehabilitation Services Pager: [phone] Office: [phone]  Evern Bio Harshika Mago 07/21/2019, 3:52 PM

## 2019-07-21 NOTE — Progress Notes (Addendum)
Advanced Heart Failure Rounding Note  PCP-Cardiologist: No primary care provider on file.   Subjective:    Underwent 2v CABG 9/16 (LIMA -> LAD, SVG -> OM) with Dr. Vickey SagesAtkins.   Overnight she had difficulty with word finding. Code Stroke activated. CT head showed caudate stroke. Neuro following  Denies SOB. Follows most  commands. Having ongoing difficulty with word finding and possibly some confusion     Objective:   Weight Range: 67.7 kg Body mass index is 24.09 kg/m.   Vital Signs:   Temp:  [97.5 F (36.4 C)-98.2 F (36.8 C)] 97.5 F (36.4 C) (09/18 0300) Pulse Rate:  [34-120] 61 (09/18 0500) Resp:  [10-29] 20 (09/18 0500) BP: (86-131)/(50-80) 124/56 (09/18 0500) SpO2:  [91 %-100 %] 99 % (09/18 0500) Arterial Line BP: (103-151)/(37-61) 128/46 (09/18 0500) Weight:  [67.7 kg] 67.7 kg (09/18 0449) Last BM Date: 07/17/19  Weight change: Filed Weights   07/20/19 0000 07/20/19 0254 07/21/19 0449  Weight: 67.5 kg 67.5 kg 67.7 kg    Intake/Output:   Intake/Output Summary (Last 24 hours) at 07/21/2019 0804 Last data filed at 07/21/2019 0600 Gross per 24 hour  Intake 847.21 ml  Output 2060 ml  Net -1212.79 ml      Physical Exam    General:   Sitting in the chair. No resp difficulty. Pale  HEENT: normal anicteric Neck: supple. JVP  6-7  Carotids 2+ bilat; no bruits. No lymphadenopathy or thryomegaly appreciated. Cor: Sternal dressing ok. PMI nondisplaced. Irregular tchy No rubs, gallops or murmurs. MT  Lungs: clear dull at bases Abdomen: soft, nontender, nondistended. No hepatosplenomegaly. No bruits or masses. Good bowel sounds. Extremities: no cyanosis, clubbing, rash, edema Neuro: alert & orientedx1 self. Unable to tell president or why she is here.  Difficulty with word finding. cranial nerves grossly intact. moves all 4 extremities w/o difficulty. Affect pleasant    Telemetry   A fib 100s personally reviewed.   Labs    CBC Recent Labs     07/20/19 0449 07/20/19 1600  WBC 12.9* 12.0*  HGB 9.0* 9.7*  HCT 28.6* 30.9*  MCV 80.1 80.1  PLT 197 181   Basic Metabolic Panel Recent Labs    04/54/0909/17/20 0449 07/20/19 1600 07/21/19 0444  NA 137 137 133*  K 3.9 4.1 3.9  CL 106 104 104  CO2 23 23 23   GLUCOSE 119* 135* 117*  BUN 9 12 15   CREATININE 0.56 0.69 0.79  CALCIUM 8.9 9.1 9.3  MG 2.5* 2.2  --    Liver Function Tests No results for input(s): AST, ALT, ALKPHOS, BILITOT, PROT, ALBUMIN in the last 72 hours. No results for input(s): LIPASE, AMYLASE in the last 72 hours. Cardiac Enzymes No results for input(s): CKTOTAL, CKMB, CKMBINDEX, TROPONINI in the last 72 hours.  BNP: BNP (last 3 results) Recent Labs    07/16/19 0141  BNP 2,027.0*    ProBNP (last 3 results) No results for input(s): PROBNP in the last 8760 hours.   D-Dimer No results for input(s): DDIMER in the last 72 hours. Hemoglobin A1C No results for input(s): HGBA1C in the last 72 hours. Fasting Lipid Panel Recent Labs    07/21/19 0444  CHOL 93  HDL 33*  LDLCALC 44  TRIG 81  CHOLHDL 2.8   Thyroid Function Tests No results for input(s): TSH, T4TOTAL, T3FREE, THYROIDAB in the last 72 hours.  Invalid input(s): FREET3  Other results:   Imaging    Ct Angio Head W Or Wo Contrast  Result Date: 07/20/2019 CLINICAL DATA:  85 year old female with acute slurred speech, aphasia. Status post CABG today. EXAM: CT ANGIOGRAPHY HEAD AND NECK CT PERFUSION BRAIN TECHNIQUE: Multidetector CT imaging of the head and neck was performed using the standard protocol during bolus administration of intravenous contrast. Multiplanar CT image reconstructions and MIPs were obtained to evaluate the vascular anatomy. Carotid stenosis measurements (when applicable) are obtained utilizing NASCET criteria, using the distal internal carotid diameter as the denominator. Multiphase CT imaging of the brain was performed following IV bolus contrast injection. Subsequent  parametric perfusion maps were calculated using RAPID software. CONTRAST:  OMNIPAQUE IOHEXOL 350 MG/ML SOLN COMPARISON:  None. FINDINGS: CT HEAD FINDINGS Brain: Degraded by motion. No intracranial mass effect, hemorrhage or ventriculomegaly. No definite acute cortically based infarct. Best seen on coronal series 8, image 29 is a lacunar infarct of the left caudate. Vascular: No suspicious intracranial vascular hyperdensity. Skull: Motion artifact, No acute osseous abnormality identified. Sinuses/Orbits: Visualized paranasal sinuses and mastoids are clear. Other: No acute orbit or scalp soft tissue finding. ASPECTS [facility] Stroke Program Early CT Score) Total score (0-10 with 10 being normal): 106 (age indeterminate left caudate lacune). Review of the MIP images confirms the above findings CT Brain Perfusion Findings: ASPECTS: 9 CBF (<30%) Volume: None Perfusion (Tmax>6.0s) volume: None Mismatch Volume: Not applicable Infarction Location:Not applicable CTA NECK Skeleton: Recent sternotomy. Degenerative changes in the cervical spine. Upper chest: Partially visible left chest tube. Small layering left pleural effusion and trace left apically pneumothorax. Trace right pleural effusion and right apically pneumothorax. Partially visible right chest tube. Minor right lung atelectasis. Postoperative changes to the mediastinum. Other neck: Gas in the neck and at the thoracic inlet related to CABG. Marked thyromegaly. Severely enlarged left thyroid lobe which tracks cephalad to the level of the C3 vertebra encompasses 39 by 83 by 53 millimeters (AP by transverse by CC). Heterogeneous enhancement throughout the left lobe. The isthmus is enlarged at the thoracic inlet to 16 millimeters in thickness. The right lobe has a more normal size measuring 31 by 17 by 39 millimeters. Left neck regional mass effect from the thyroid enlargement, including lateral displacement of the left carotid space. No cervical lymphadenopathy.  Aortic arch: Recent CABG. Calcified aortic atherosclerosis. 3 vessel arch configuration. Right carotid system: Right ICA origin and bulb atherosclerosis without stenosis. Tortuous right ICA distal to the bulb with mildly kinked appearance. Left carotid system: Left ICA origin calcified plaque without stenosis. Vertebral arteries: No proximal right subclavian artery stenosis. Calcified plaque adjacent to the right vertebral artery origin without stenosis. Patent right vertebral to the skull base without stenosis. No proximal left subclavian artery stenosis. Calcified plaque near the left vertebral artery origin without stenosis. Mildly dominant left vertebral artery. Tortuous left V1. Left vertebral is patent to the skull base without stenosis. CTA HEAD Posterior circulation: Dominant left V4 segment with calcified plaque but no significant stenosis. Patent vertebrobasilar junction. Patent PICA origins. Irregular basilar artery is patent without stenosis. Patent SCA and PCA origins. Posterior communicating arteries are diminutive or absent. Bilateral PCAs are irregular without stenosis. Anterior circulation: Both ICA siphons are patent. On the left there is a mild to moderate stenosis due to calcified plaque. On the right there is moderate to severe stenosis at the distal cavernous and supraclinoid segments due to calcified plaque (series 14, images 86 and 89). Patent carotid termini. Patent MCA and ACA origins. Bilateral ACA branches are within normal limits. Left MCA M1 segment and bifurcation are patent  without stenosis. No left MCA branch occlusion is identified. Right MCA M1 and bifurcation are patent without stenosis. No right MCA branch occlusion is identified. Venous sinuses: Patent. Anatomic variants: Dominant left vertebral artery. Review of the MIP images confirms the above findings IMPRESSION: 1. Age indeterminate left caudate lacune, favor chronic. No intracranial hemorrhage or mass effect. 2. CT  Perfusion does not detect infarct core or ischemic penumbra. 3. CTA is negative for large vessel occlusion. - moderate to severe right ICA siphon stenosis due to calcified plaque. - mild-to-moderate left ICA siphon stenosis. - no other significant arterial stenosis in the head or neck despite atherosclerosis. 4. Recent CABG with postoperative changes to the chest including trace pneumothoraces, chest tubes in place. 5. Marked thyroid goiter, the left lobe is 8.3 cm length. Preliminary report of the above was discussed by telephone with Dr. Ritta Slot on 07/20/2019 at 2104 hours, and final report was delayed due to unplanned radiology downtime. Electronically Signed   By: Odessa Fleming M.D.   On: 07/20/2019 21:58   Ct Angio Neck W Or Wo Contrast  Result Date: 07/20/2019 CLINICAL DATA:  85 year old female with acute slurred speech, aphasia. Status post CABG today. EXAM: CT ANGIOGRAPHY HEAD AND NECK CT PERFUSION BRAIN TECHNIQUE: Multidetector CT imaging of the head and neck was performed using the standard protocol during bolus administration of intravenous contrast. Multiplanar CT image reconstructions and MIPs were obtained to evaluate the vascular anatomy. Carotid stenosis measurements (when applicable) are obtained utilizing NASCET criteria, using the distal internal carotid diameter as the denominator. Multiphase CT imaging of the brain was performed following IV bolus contrast injection. Subsequent parametric perfusion maps were calculated using RAPID software. CONTRAST:  OMNIPAQUE IOHEXOL 350 MG/ML SOLN COMPARISON:  None. FINDINGS: CT HEAD FINDINGS Brain: Degraded by motion. No intracranial mass effect, hemorrhage or ventriculomegaly. No definite acute cortically based infarct. Best seen on coronal series 8, image 29 is a lacunar infarct of the left caudate. Vascular: No suspicious intracranial vascular hyperdensity. Skull: Motion artifact, No acute osseous abnormality identified. Sinuses/Orbits:  Visualized paranasal sinuses and mastoids are clear. Other: No acute orbit or scalp soft tissue finding. ASPECTS [facility] Stroke Program Early CT Score) Total score (0-10 with 10 being normal): 57 (age indeterminate left caudate lacune). Review of the MIP images confirms the above findings CT Brain Perfusion Findings: ASPECTS: 9 CBF (<30%) Volume: None Perfusion (Tmax>6.0s) volume: None Mismatch Volume: Not applicable Infarction Location:Not applicable CTA NECK Skeleton: Recent sternotomy. Degenerative changes in the cervical spine. Upper chest: Partially visible left chest tube. Small layering left pleural effusion and trace left apically pneumothorax. Trace right pleural effusion and right apically pneumothorax. Partially visible right chest tube. Minor right lung atelectasis. Postoperative changes to the mediastinum. Other neck: Gas in the neck and at the thoracic inlet related to CABG. Marked thyromegaly. Severely enlarged left thyroid lobe which tracks cephalad to the level of the C3 vertebra encompasses 39 by 83 by 53 millimeters (AP by transverse by CC). Heterogeneous enhancement throughout the left lobe. The isthmus is enlarged at the thoracic inlet to 16 millimeters in thickness. The right lobe has a more normal size measuring 31 by 17 by 39 millimeters. Left neck regional mass effect from the thyroid enlargement, including lateral displacement of the left carotid space. No cervical lymphadenopathy. Aortic arch: Recent CABG. Calcified aortic atherosclerosis. 3 vessel arch configuration. Right carotid system: Right ICA origin and bulb atherosclerosis without stenosis. Tortuous right ICA distal to the bulb with mildly kinked appearance. Left  carotid system: Left ICA origin calcified plaque without stenosis. Vertebral arteries: No proximal right subclavian artery stenosis. Calcified plaque adjacent to the right vertebral artery origin without stenosis. Patent right vertebral to the skull base without stenosis.  No proximal left subclavian artery stenosis. Calcified plaque near the left vertebral artery origin without stenosis. Mildly dominant left vertebral artery. Tortuous left V1. Left vertebral is patent to the skull base without stenosis. CTA HEAD Posterior circulation: Dominant left V4 segment with calcified plaque but no significant stenosis. Patent vertebrobasilar junction. Patent PICA origins. Irregular basilar artery is patent without stenosis. Patent SCA and PCA origins. Posterior communicating arteries are diminutive or absent. Bilateral PCAs are irregular without stenosis. Anterior circulation: Both ICA siphons are patent. On the left there is a mild to moderate stenosis due to calcified plaque. On the right there is moderate to severe stenosis at the distal cavernous and supraclinoid segments due to calcified plaque (series 14, images 86 and 89). Patent carotid termini. Patent MCA and ACA origins. Bilateral ACA branches are within normal limits. Left MCA M1 segment and bifurcation are patent without stenosis. No left MCA branch occlusion is identified. Right MCA M1 and bifurcation are patent without stenosis. No right MCA branch occlusion is identified. Venous sinuses: Patent. Anatomic variants: Dominant left vertebral artery. Review of the MIP images confirms the above findings IMPRESSION: 1. Age indeterminate left caudate lacune, favor chronic. No intracranial hemorrhage or mass effect. 2. CT Perfusion does not detect infarct core or ischemic penumbra. 3. CTA is negative for large vessel occlusion. - moderate to severe right ICA siphon stenosis due to calcified plaque. - mild-to-moderate left ICA siphon stenosis. - no other significant arterial stenosis in the head or neck despite atherosclerosis. 4. Recent CABG with postoperative changes to the chest including trace pneumothoraces, chest tubes in place. 5. Marked thyroid goiter, the left lobe is 8.3 cm length. Preliminary report of the above was discussed  by telephone with Dr. Ritta Slot on 07/20/2019 at 2104 hours, and final report was delayed due to unplanned radiology downtime. Electronically Signed   By: Odessa Fleming M.D.   On: 07/20/2019 21:58   Ct Cerebral Perfusion W Contrast  Result Date: 07/20/2019 CLINICAL DATA:  85 year old female with acute slurred speech, aphasia. Status post CABG today. EXAM: CT ANGIOGRAPHY HEAD AND NECK CT PERFUSION BRAIN TECHNIQUE: Multidetector CT imaging of the head and neck was performed using the standard protocol during bolus administration of intravenous contrast. Multiplanar CT image reconstructions and MIPs were obtained to evaluate the vascular anatomy. Carotid stenosis measurements (when applicable) are obtained utilizing NASCET criteria, using the distal internal carotid diameter as the denominator. Multiphase CT imaging of the brain was performed following IV bolus contrast injection. Subsequent parametric perfusion maps were calculated using RAPID software. CONTRAST:  OMNIPAQUE IOHEXOL 350 MG/ML SOLN COMPARISON:  None. FINDINGS: CT HEAD FINDINGS Brain: Degraded by motion. No intracranial mass effect, hemorrhage or ventriculomegaly. No definite acute cortically based infarct. Best seen on coronal series 8, image 29 is a lacunar infarct of the left caudate. Vascular: No suspicious intracranial vascular hyperdensity. Skull: Motion artifact, No acute osseous abnormality identified. Sinuses/Orbits: Visualized paranasal sinuses and mastoids are clear. Other: No acute orbit or scalp soft tissue finding. ASPECTS [facility] Stroke Program Early CT Score) Total score (0-10 with 10 being normal): 48 (age indeterminate left caudate lacune). Review of the MIP images confirms the above findings CT Brain Perfusion Findings: ASPECTS: 9 CBF (<30%) Volume: None Perfusion (Tmax>6.0s) volume: None Mismatch Volume: Not  applicable Infarction Location:Not applicable CTA NECK Skeleton: Recent sternotomy. Degenerative changes in the  cervical spine. Upper chest: Partially visible left chest tube. Small layering left pleural effusion and trace left apically pneumothorax. Trace right pleural effusion and right apically pneumothorax. Partially visible right chest tube. Minor right lung atelectasis. Postoperative changes to the mediastinum. Other neck: Gas in the neck and at the thoracic inlet related to CABG. Marked thyromegaly. Severely enlarged left thyroid lobe which tracks cephalad to the level of the C3 vertebra encompasses 39 by 83 by 53 millimeters (AP by transverse by CC). Heterogeneous enhancement throughout the left lobe. The isthmus is enlarged at the thoracic inlet to 16 millimeters in thickness. The right lobe has a more normal size measuring 31 by 17 by 39 millimeters. Left neck regional mass effect from the thyroid enlargement, including lateral displacement of the left carotid space. No cervical lymphadenopathy. Aortic arch: Recent CABG. Calcified aortic atherosclerosis. 3 vessel arch configuration. Right carotid system: Right ICA origin and bulb atherosclerosis without stenosis. Tortuous right ICA distal to the bulb with mildly kinked appearance. Left carotid system: Left ICA origin calcified plaque without stenosis. Vertebral arteries: No proximal right subclavian artery stenosis. Calcified plaque adjacent to the right vertebral artery origin without stenosis. Patent right vertebral to the skull base without stenosis. No proximal left subclavian artery stenosis. Calcified plaque near the left vertebral artery origin without stenosis. Mildly dominant left vertebral artery. Tortuous left V1. Left vertebral is patent to the skull base without stenosis. CTA HEAD Posterior circulation: Dominant left V4 segment with calcified plaque but no significant stenosis. Patent vertebrobasilar junction. Patent PICA origins. Irregular basilar artery is patent without stenosis. Patent SCA and PCA origins. Posterior communicating arteries are  diminutive or absent. Bilateral PCAs are irregular without stenosis. Anterior circulation: Both ICA siphons are patent. On the left there is a mild to moderate stenosis due to calcified plaque. On the right there is moderate to severe stenosis at the distal cavernous and supraclinoid segments due to calcified plaque (series 14, images 86 and 89). Patent carotid termini. Patent MCA and ACA origins. Bilateral ACA branches are within normal limits. Left MCA M1 segment and bifurcation are patent without stenosis. No left MCA branch occlusion is identified. Right MCA M1 and bifurcation are patent without stenosis. No right MCA branch occlusion is identified. Venous sinuses: Patent. Anatomic variants: Dominant left vertebral artery. Review of the MIP images confirms the above findings IMPRESSION: 1. Age indeterminate left caudate lacune, favor chronic. No intracranial hemorrhage or mass effect. 2. CT Perfusion does not detect infarct core or ischemic penumbra. 3. CTA is negative for large vessel occlusion. - moderate to severe right ICA siphon stenosis due to calcified plaque. - mild-to-moderate left ICA siphon stenosis. - no other significant arterial stenosis in the head or neck despite atherosclerosis. 4. Recent CABG with postoperative changes to the chest including trace pneumothoraces, chest tubes in place. 5. Marked thyroid goiter, the left lobe is 8.3 cm length. Preliminary report of the above was discussed by telephone with Dr. Ritta SlotMCNEILL KIRKPATRICK on 07/20/2019 at 2104 hours, and final report was delayed due to unplanned radiology downtime. Electronically Signed   By: Odessa FlemingH  Hall M.D.   On: 07/20/2019 21:58     Medications:     Scheduled Medications:  acetaminophen  1,000 mg Oral Q6H   Or   acetaminophen (TYLENOL) oral liquid 160 mg/5 mL  1,000 mg Per Tube Q6H   amiodarone  200 mg Oral BID   apixaban  5  mg Oral BID   aspirin EC  81 mg Oral Daily   atorvastatin  40 mg Oral q1800   bisacodyl  10 mg  Oral Daily   Or   bisacodyl  10 mg Rectal Daily   Chlorhexidine Gluconate Cloth  6 each Topical Daily   colchicine  0.3 mg Oral BID   docusate sodium  200 mg Oral Daily   insulin aspart  0-24 Units Subcutaneous Q4H   levalbuterol  1 puff Inhalation Q6H   magnesium oxide  400 mg Oral BID   mouth rinse  15 mL Mouth Rinse BID   metoprolol tartrate  12.5 mg Oral BID   Or   metoprolol tartrate  12.5 mg Per Tube BID   pantoprazole  40 mg Oral Daily   sodium chloride flush  3 mL Intravenous Q12H    Infusions:  sodium chloride Stopped (07/20/19 0950)   sodium chloride     sodium chloride 20 mL/hr at 07/19/19 1415   acetaminophen 1,000 mg (07/21/19 0508)   amiodarone 30 mg/hr (07/21/19 0600)   cefUROXime (ZINACEF)  IV Stopped (07/20/19 2237)   lactated ringers 20 mL/hr at 07/21/19 0100   lactated ringers     phenylephrine (NEO-SYNEPHRINE) Adult infusion 20 mcg/min (07/20/19 2200)    PRN Medications: sodium chloride, metoprolol tartrate, ondansetron (ZOFRAN) IV, sodium chloride flush     Assessment/Plan   1. Acute coronary syndrome - Severe Multivessel Coronary Artery Disease - HS Trop 16>109>604 - LHC - distal LM lesion 90% stenosed, proximal circumflex 80% stenosis, proximal LAD to mid LAD 90% stenosed, first diagonal lesion 80% stenosed, mid circumflex 100% stenosed, proximal RCA 90% stenosed, and proximal RCA to mid RCA 100% stenosed. - s/p CABG x 2 (LIMA -> SVG -> OM) on 9/16  -Currently on Neo 20 mcg.  - No chest pain.  - ASA/statin   2. Acute Systolic HF, ICM  - 9/13 ECHO EF 30-35% RV ok  - LHC/RHC multivessel disease, CI 1.9, PCWP 25.  - s/p CABG 9/16. Stable hemodynamics - Stop metoprolol for now. Can restart bb down the road.  - diurese as tolerated. Wean neo  3. Paroxysmal A fib RVR - pre-op Back in A fib. - Hold po amio .  - Continue amio drip 30 mg per hour.  - Add eliquis 2.5 mg twice a day.   4. CVA CT with left caudate stroke.   Neuro following. Today she has ongoing expressive aphasia.  Adding eliquis per Dr Vickey Sages.  - Continue statin.  - PT/OT/Speech consult.    Length of Stay: 4  Amy Clegg, NP  07/21/2019, 8:04 AM  Advanced Heart Failure Team Pager [phone] (M-F; 7a - 4p)  Please contact [facility] for night-coverage after hours (4p -7a ) and weekends on [email or web address]  Patient seen and examined with the above-signed Advanced Practice Provider and/or Housestaff. I personally reviewed laboratory data, imaging studies and relevant notes. I independently examined the patient and formulated the important aspects of the plan. I have edited the note to reflect any of my changes or salient points. I have personally discussed the plan with the patient and/or family.  Overnight went into AF and also developed acute aphasia. Seen by Neuro. Stat CTA with no large vessel occlusion. Remains mildly aphasic. Started on IV amio and apixaban. Swallow study pending. Having echo now. Remains on low-dose neo. If needs more support would switch to NE. Needs gentle diuresis.  Once BP stable add spiro +/- ARB. D/w Dr. Vickey Sages.  Glori Bickers, MD  9:02 AM

## 2019-07-21 NOTE — Evaluation (Signed)
Physical Therapy Evaluation Patient Details Name: KATORIA YETMAN MRN: [id number] DOB: 03-27-1931 Today's Date: 07/21/2019   History of Present Illness  85 y.o. 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 She had brief peri-operative afib at that time.  Presented with aphasia later that day and code-stroke was activated.   CT head showed well-defined stroke in the head of the left caudate nucleus.    Clinical Impression  Pt admitted with above diagnosis. PTA pt lived at home alone, independent. On eval, she required min assist bed mobility, transfers and ambulation 50 feet with eva walker. +2 required for safety/line management. Pt returned to bed at end of session for chest tube removal.  Pt currently with functional limitations due to the deficits listed below (see PT Problem List). Pt will benefit from skilled PT to increase their independence and safety with mobility to allow discharge to the venue listed below.       Follow Up Recommendations CIR    Equipment Recommendations  Other (comment)(TBD)    Recommendations for Other Services Rehab consult     Precautions / Restrictions Precautions Precautions: Sternal;Fall;Other (comment) Precaution Comments: 3 chest tubes, a line, foley, central line, peripheral IV, O2      Mobility  Bed Mobility Overal bed mobility: Needs Assistance Bed Mobility: Supine to Sit;Sit to Supine     Supine to sit: Min assist;HOB elevated Sit to supine: Min assist;HOB elevated;+2 for safety/equipment   General bed mobility comments: assist to maintain sternal precautions, +2 for line management  Transfers Overall transfer level: Needs assistance Equipment used: Ambulation equipment used Transfers: Sit to/from Stand Sit to Stand: Min assist;+2 safety/equipment         General transfer comment: cues for sternal precautions, assist to power up, +2 for line management  Ambulation/Gait Ambulation/Gait assistance: Min  assist;+2 safety/equipment Gait Distance (Feet): 50 Feet Assistive device: Bilateral platform walker(eva walker) Gait Pattern/deviations: Step-through pattern Gait velocity: decreased   General Gait Details: Prolonged sitting at edge of bed prior to ambulation due to infiltrated IV. Pt fatigued quickly upon initiation of amb requiring return to room. Ambulated on RA with desat to 83%.  Stairs            Wheelchair Mobility    Modified Rankin (Stroke Patients Only)       Balance Overall balance assessment: Needs assistance Sitting-balance support: No upper extremity supported;Feet supported Sitting balance-Leahy Scale: Good     Standing balance support: During functional activity;No upper extremity supported;Bilateral upper extremity supported Standing balance-Leahy Scale: Fair Standing balance comment: BUE support for amb                             Pertinent Vitals/Pain Pain Assessment: Faces Faces Pain Scale: Hurts little more Pain Location: chest/incision Pain Descriptors / Indicators: Grimacing;Sore Pain Intervention(s): Limited activity within patient's tolerance;Repositioned;Monitored during session    [place] expects to be discharged to:: Private residence Living Arrangements: Alone Available Help at Discharge: Family;Available 24 hours/day Type of Home: House Home Access: Stairs to enter Entrance Stairs-Rails: Psychiatric nurse of Steps: 2 Home Layout: One level Home Equipment: Wheelchair - [employer] - 2 wheels Additional Comments: equipment belonged to her husband    Prior Function Level of Independence: Independent               Hand Dominance   Dominant Hand: Right    Extremity/Trunk Assessment   Upper Extremity Assessment  Upper Extremity Assessment: Defer to OT evaluation    Lower Extremity Assessment Lower Extremity Assessment: Overall WFL for tasks assessed    Cervical / Trunk  Assessment Cervical / Trunk Assessment: Normal  Communication   Communication: Expressive difficulties  Cognition Arousal/Alertness: Awake/alert Behavior During Therapy: WFL for tasks assessed/performed Overall Cognitive Status: Impaired/Different from baseline Area of Impairment: Orientation;Problem solving;Following commands;Safety/judgement                 Orientation Level: Disoriented to;Situation;Time     Following Commands: Follows multi-step commands inconsistently;Follows multi-step commands with increased time Safety/Judgement: Decreased awareness of deficits;Decreased awareness of safety   Problem Solving: Difficulty sequencing General Comments: impulsive      General Comments General comments (skin integrity, edema, etc.): Pt on 2L O2 in bed with SpO2 100%. O2 removed to assess readiness to wean. SpO2 96% EOB on RA. Desat to 83% during amb. Returned to 2L in room with immediate increase in SpO2 to 100%.    Exercises     Assessment/Plan    PT Assessment Patient needs continued PT services  PT Problem List Decreased mobility;Decreased strength;Decreased safety awareness;Decreased knowledge of precautions;Decreased activity tolerance;Decreased cognition;Decreased balance;Decreased knowledge of use of DME;Pain;Cardiopulmonary status limiting activity       PT Treatment Interventions DME instruction;Therapeutic activities;Cognitive remediation;Gait training;Therapeutic exercise;Patient/family education;Balance training;Functional mobility training;Stair training    PT Goals (Current goals can be found in the Care Plan section)  Acute Rehab PT Goals Patient Stated Goal: home per daughter PT Goal Formulation: With patient/family Time For Goal Achievement: 08/04/19 Potential to Achieve Goals: Good    Frequency Min 3X/week   Barriers to discharge        Co-evaluation PT/OT/SLP Co-Evaluation/Treatment: Yes Reason for Co-Treatment: Complexity of the patient's  impairments (multi-system involvement);Necessary to address cognition/behavior during functional activity;To address functional/ADL transfers PT goals addressed during session: Mobility/safety with mobility;Balance         AM-PAC PT "6 Clicks" Mobility  Outcome Measure Help needed turning from your back to your side while in a flat bed without using bedrails?: A Little Help needed moving from lying on your back to sitting on the side of a flat bed without using bedrails?: A Little Help needed moving to and from a bed to a chair (including a wheelchair)?: A Little Help needed standing up from a chair using your arms (e.g., wheelchair or bedside chair)?: A Little Help needed to walk in hospital room?: A Little Help needed climbing 3-5 steps with a railing? : A Lot 6 Click Score: 17    End of Session Equipment Utilized During Treatment: Gait belt;Oxygen Activity Tolerance: Patient limited by pain;Patient limited by fatigue Patient left: in bed;with call bell/phone within reach;with family/visitor present;with nursing/sitter in room Nurse Communication: Mobility status PT Visit Diagnosis: Other abnormalities of gait and mobility (R26.89);Pain    Time: 8563-1497 PT Time Calculation (min) (ACUTE ONLY): 36 min   Charges:   PT Evaluation $PT Eval Moderate Complexity: 1 Mod          Aida Raider, PT  Office # [phone] Pager [phone]   Ilda Foil 07/21/2019, 1:46 PM

## 2019-07-21 NOTE — Progress Notes (Signed)
Rehab Admissions Coordinator Note:  Per therapy recommendations patient was screened by Michel Santee for appropriateness for an Inpatient Acute Rehab Consult.  At this time, we are recommending Inpatient Rehab consult.  Please place a consult order if pt would like to be considered.    Michel Santee 07/21/2019, 2:25 PM  I can be reached at [phone].

## 2019-07-22 ENCOUNTER — Inpatient Hospital Stay (HOSPITAL_COMMUNITY): Payer: Medicare Other

## 2019-07-22 LAB — TYPE AND SCREEN
ABO/RH(D): O POS
Antibody Screen: NEGATIVE
Unit division: 0
Unit division: 0
Unit division: 0
Unit division: 0

## 2019-07-22 LAB — BPAM RBC
Blood Product Expiration Date: 202010232359
Blood Product Expiration Date: 202010232359
Blood Product Expiration Date: 202010232359
Blood Product Expiration Date: 202010232359
ISSUE DATE / TIME: 202009161116
ISSUE DATE / TIME: 202009161116
ISSUE DATE / TIME: 202009172343
Unit Type and Rh: 5100
Unit Type and Rh: 5100
Unit Type and Rh: 5100
Unit Type and Rh: 5100

## 2019-07-22 LAB — BASIC METABOLIC PANEL
Anion gap: 10 (ref 5–15)
BUN: 21 mg/dL (ref 8–23)
CO2: 25 mmol/L (ref 22–32)
Calcium: 9 mg/dL (ref 8.9–10.3)
Chloride: 97 mmol/L — ABNORMAL LOW (ref 98–111)
Creatinine, Ser: 0.69 mg/dL (ref 0.44–1.00)
GFR calc Af Amer: >60 mL/min (ref >60)
GFR calc non Af Amer: >60 mL/min (ref >60)
Glucose, Bld: 110 mg/dL — ABNORMAL HIGH (ref 70–99)
Potassium: 3.6 mmol/L (ref 3.5–5.1)
Sodium: 132 mmol/L — ABNORMAL LOW (ref 135–145)

## 2019-07-22 LAB — CBC WITH DIFFERENTIAL/PLATELET
Abs Immature Granulocytes: 0.09 10*3/uL — ABNORMAL HIGH (ref 0.00–0.07)
Basophils Absolute: 0 10*3/uL (ref 0.0–0.1)
Basophils Relative: 0 %
Eosinophils Absolute: 0 10*3/uL (ref 0.0–0.5)
Eosinophils Relative: 0 %
HCT: 28.5 % — ABNORMAL LOW (ref 36.0–46.0)
Hemoglobin: 8.7 g/dL — ABNORMAL LOW (ref 12.0–15.0)
Immature Granulocytes: 1 %
Lymphocytes Relative: 11 %
Lymphs Abs: 1.3 10*3/uL (ref 0.7–4.0)
MCH: 24.2 pg — ABNORMAL LOW (ref 26.0–34.0)
MCHC: 30.5 g/dL (ref 30.0–36.0)
MCV: 79.2 fL — ABNORMAL LOW (ref 80.0–100.0)
Monocytes Absolute: 1 10*3/uL (ref 0.1–1.0)
Monocytes Relative: 9 %
Neutro Abs: 9.1 10*3/uL — ABNORMAL HIGH (ref 1.7–7.7)
Neutrophils Relative %: 79 %
Platelets: 197 10*3/uL (ref 150–400)
RBC: 3.6 MIL/uL — ABNORMAL LOW (ref 3.87–5.11)
RDW: 17.4 % — ABNORMAL HIGH (ref 11.5–15.5)
WBC: 11.5 10*3/uL — ABNORMAL HIGH (ref 4.0–10.5)
nRBC: 0 % (ref 0.0–0.2)

## 2019-07-22 LAB — GLUCOSE, CAPILLARY
Glucose-Capillary: 107 mg/dL — ABNORMAL HIGH (ref 70–99)
Glucose-Capillary: 118 mg/dL — ABNORMAL HIGH (ref 70–99)
Glucose-Capillary: 120 mg/dL — ABNORMAL HIGH (ref 70–99)
Glucose-Capillary: 168 mg/dL — ABNORMAL HIGH (ref 70–99)
Glucose-Capillary: 92 mg/dL (ref 70–99)
Glucose-Capillary: 92 mg/dL (ref 70–99)

## 2019-07-22 MED ORDER — METOPROLOL TARTRATE 12.5 MG HALF TABLET
12.5000 mg | ORAL_TABLET | Freq: Two times a day (BID) | ORAL | Status: DC
  Administered 2019-07-22 – 2019-07-24 (×5): 12.5 mg via ORAL
  Filled 2019-07-22 (×5): qty 1

## 2019-07-22 MED ORDER — AMIODARONE HCL 200 MG PO TABS
200.0000 mg | ORAL_TABLET | Freq: Two times a day (BID) | ORAL | Status: DC
  Administered 2019-07-22 – 2019-07-24 (×5): 200 mg via ORAL
  Filled 2019-07-22 (×5): qty 1

## 2019-07-22 MED ORDER — POTASSIUM CHLORIDE 10 MEQ/50ML IV SOLN
10.0000 meq | INTRAVENOUS | Status: AC
  Administered 2019-07-22 (×3): 10 meq via INTRAVENOUS
  Filled 2019-07-22 (×3): qty 50

## 2019-07-22 NOTE — Plan of Care (Signed)

## 2019-07-22 NOTE — Progress Notes (Signed)
Patient heart rate afib RVR in the 150s-160s. 5mg  of prn metoprolol given.

## 2019-07-22 NOTE — Progress Notes (Signed)
Progress Note  Patient Name: Timoteo GaulBetty J Rezendes Date of Encounter: 07/22/2019  Primary Cardiologist: No primary care provider on file.   Subjective   "I feel better."  Inpatient Medications    Scheduled Meds:  acetaminophen  1,000 mg Oral Q6H   Or   acetaminophen (TYLENOL) oral liquid 160 mg/5 mL  1,000 mg Per Tube Q6H   amiodarone  200 mg Oral BID   apixaban  5 mg Oral BID   aspirin EC  81 mg Oral Daily   atorvastatin  40 mg Oral q1800   bisacodyl  10 mg Oral Daily   Or   bisacodyl  10 mg Rectal Daily   Chlorhexidine Gluconate Cloth  6 each Topical Daily   colchicine  0.3 mg Oral BID   docusate sodium  200 mg Oral Daily   insulin aspart  0-24 Units Subcutaneous Q4H   magnesium oxide  400 mg Oral BID   mouth rinse  15 mL Mouth Rinse BID   metoprolol tartrate  12.5 mg Oral BID   pantoprazole  40 mg Oral Daily   sodium chloride flush  3 mL Intravenous Q12H   Continuous Infusions:  sodium chloride Stopped (07/20/19 0950)   sodium chloride     sodium chloride 20 mL/hr at 07/19/19 1415   lactated ringers 20 mL/hr at 07/21/19 0100   lactated ringers     potassium chloride 10 mEq (07/22/19 0926)   PRN Meds: sodium chloride, metoprolol tartrate, ondansetron (ZOFRAN) IV, sodium chloride flush   Vital Signs    Vitals:   07/22/19 0600 07/22/19 0620 07/22/19 0630 07/22/19 0800  BP: (!) 108/50 134/73  107/65  Pulse: 76 84 97 78  Resp: 20 (!) 29 (!) 28 (!) 24  Temp:    (!) (P) 97.5 F (36.4 C)  TempSrc:      SpO2: 100% (!) 79% 94% 96%  Weight:      Height:        Intake/Output Summary (Last 24 hours) at 07/22/2019 1026 Last data filed at 07/22/2019 0900 Gross per 24 hour  Intake 381.78 ml  Output 995 ml  Net -613.22 ml   Filed Weights   07/20/19 0254 07/21/19 0449 07/22/19 0500  Weight: 67.5 kg 67.7 kg 66.1 kg    Telemetry    nsr - Personally Reviewed  ECG    none - Personally Reviewed  Physical Exam   GEN: elderly woman, No acute  distress.   Neck: No JVD Cardiac: RRR, no murmurs, rubs, or gallops.  Respiratory: Clear to auscultation bilaterally. GI: Soft, nontender, non-distended  MS: No edema; No deformity. Neuro:  Nonfocal  Psych: Normal affect   Labs    Chemistry Recent Labs  Lab 07/16/19 0141  07/20/19 1600 07/21/19 0444 07/22/19 0444  NA 136   < > 137 133* 132*  K 4.1   < > 4.1 3.9 3.6  CL 104   < > 104 104 97*  CO2 19*   < > 23 23 25   GLUCOSE 314*   < > 135* 117* 110*  BUN 16   < > 12 15 21   CREATININE 0.86   < > 0.69 0.79 0.69  CALCIUM 9.6   < > 9.1 9.3 9.0  PROT 6.4*  --   --   --   --   ALBUMIN 3.3*  --   --   --   --   AST 34  --   --   --   --  ALT 24  --   --   --   --   ALKPHOS 95  --   --   --   --   BILITOT 0.8  --   --   --   --   GFRNONAA >60   < > >60 >60 >60  GFRAA >60   < > >60 >60 >60  ANIONGAP 13   < > < > = values in this interval not displayed.     Hematology Recent Labs  Lab 07/20/19 1600 07/21/19 2315 07/22/19 0444  WBC 12.0* 17.2* 11.5*  RBC 3.86* 3.95 3.60*  HGB 9.7* 9.8* 8.7*  HCT 30.9* 31.0* 28.5*  MCV 80.1 78.5* 79.2*  MCH 25.1* 24.8* 24.2*  MCHC 31.4 31.6 30.5  RDW 16.9* 17.5* 17.4*  PLT 181 245 197    Cardiac EnzymesNo results for input(s): TROPONINI in the last 168 hours. No results for input(s): TROPIPOC in the last 168 hours.   BNP Recent Labs  Lab 07/16/19 0141  BNP 2,027.0*     DDimer No results for input(s): DDIMER in the last 168 hours.   Radiology    Ct Angio Head W Or Wo Contrast  Result Date: 07/20/2019 CLINICAL DATA:  85 year old female with acute slurred speech, aphasia. Status post CABG today. EXAM: CT ANGIOGRAPHY HEAD AND NECK CT PERFUSION BRAIN TECHNIQUE: Multidetector CT imaging of the head and neck was performed using the standard protocol during bolus administration of intravenous contrast. Multiplanar CT image reconstructions and MIPs were obtained to evaluate the vascular anatomy. Carotid stenosis measurements  (when applicable) are obtained utilizing NASCET criteria, using the distal internal carotid diameter as the denominator. Multiphase CT imaging of the brain was performed following IV bolus contrast injection. Subsequent parametric perfusion maps were calculated using RAPID software. CONTRAST:  OMNIPAQUE IOHEXOL 350 MG/ML SOLN COMPARISON:  None. FINDINGS: CT HEAD FINDINGS Brain: Degraded by motion. No intracranial mass effect, hemorrhage or ventriculomegaly. No definite acute cortically based infarct. Best seen on coronal series 8, image 29 is a lacunar infarct of the left caudate. Vascular: No suspicious intracranial vascular hyperdensity. Skull: Motion artifact, No acute osseous abnormality identified. Sinuses/Orbits: Visualized paranasal sinuses and mastoids are clear. Other: No acute orbit or scalp soft tissue finding. ASPECTS [facility] Stroke Program Early CT Score) Total score (0-10 with 10 being normal): 24 (age indeterminate left caudate lacune). Review of the MIP images confirms the above findings CT Brain Perfusion Findings: ASPECTS: 9 CBF (<30%) Volume: None Perfusion (Tmax>6.0s) volume: None Mismatch Volume: Not applicable Infarction Location:Not applicable CTA NECK Skeleton: Recent sternotomy. Degenerative changes in the cervical spine. Upper chest: Partially visible left chest tube. Small layering left pleural effusion and trace left apically pneumothorax. Trace right pleural effusion and right apically pneumothorax. Partially visible right chest tube. Minor right lung atelectasis. Postoperative changes to the mediastinum. Other neck: Gas in the neck and at the thoracic inlet related to CABG. Marked thyromegaly. Severely enlarged left thyroid lobe which tracks cephalad to the level of the C3 vertebra encompasses 39 by 83 by 53 millimeters (AP by transverse by CC). Heterogeneous enhancement throughout the left lobe. The isthmus is enlarged at the thoracic inlet to 16 millimeters in thickness. The  right lobe has a more normal size measuring 31 by 17 by 39 millimeters. Left neck regional mass effect from the thyroid enlargement, including lateral displacement of the left carotid space. No cervical lymphadenopathy. Aortic arch: Recent CABG. Calcified aortic atherosclerosis. 3 vessel arch configuration.  Right carotid system: Right ICA origin and bulb atherosclerosis without stenosis. Tortuous right ICA distal to the bulb with mildly kinked appearance. Left carotid system: Left ICA origin calcified plaque without stenosis. Vertebral arteries: No proximal right subclavian artery stenosis. Calcified plaque adjacent to the right vertebral artery origin without stenosis. Patent right vertebral to the skull base without stenosis. No proximal left subclavian artery stenosis. Calcified plaque near the left vertebral artery origin without stenosis. Mildly dominant left vertebral artery. Tortuous left V1. Left vertebral is patent to the skull base without stenosis. CTA HEAD Posterior circulation: Dominant left V4 segment with calcified plaque but no significant stenosis. Patent vertebrobasilar junction. Patent PICA origins. Irregular basilar artery is patent without stenosis. Patent SCA and PCA origins. Posterior communicating arteries are diminutive or absent. Bilateral PCAs are irregular without stenosis. Anterior circulation: Both ICA siphons are patent. On the left there is a mild to moderate stenosis due to calcified plaque. On the right there is moderate to severe stenosis at the distal cavernous and supraclinoid segments due to calcified plaque (series 14, images 86 and 89). Patent carotid termini. Patent MCA and ACA origins. Bilateral ACA branches are within normal limits. Left MCA M1 segment and bifurcation are patent without stenosis. No left MCA branch occlusion is identified. Right MCA M1 and bifurcation are patent without stenosis. No right MCA branch occlusion is identified. Venous sinuses: Patent. Anatomic  variants: Dominant left vertebral artery. Review of the MIP images confirms the above findings IMPRESSION: 1. Age indeterminate left caudate lacune, favor chronic. No intracranial hemorrhage or mass effect. 2. CT Perfusion does not detect infarct core or ischemic penumbra. 3. CTA is negative for large vessel occlusion. - moderate to severe right ICA siphon stenosis due to calcified plaque. - mild-to-moderate left ICA siphon stenosis. - no other significant arterial stenosis in the head or neck despite atherosclerosis. 4. Recent CABG with postoperative changes to the chest including trace pneumothoraces, chest tubes in place. 5. Marked thyroid goiter, the left lobe is 8.3 cm length. Preliminary report of the above was discussed by telephone with Dr. Ritta SlotMCNEILL KIRKPATRICK on 07/20/2019 at 2104 hours, and final report was delayed due to unplanned radiology downtime. Electronically Signed   By: Odessa FlemingH  Hall M.D.   On: 07/20/2019 21:58   Dg Chest 1 View  Result Date: 07/21/2019 CLINICAL DATA:  Shortness of breath EXAM: CHEST  1 VIEW COMPARISON:  Yesterday FINDINGS: Swan-Ganz catheter has been removed. CABG with cardiomegaly distorted by rotation, essentially unchanged. Small pleural effusions and atelectasis. Chest tube with no visible pneumothorax. IMPRESSION: Stable atelectasis and small pleural effusions. Electronically Signed   By: Marnee SpringJonathon  Watts M.D.   On: 07/21/2019 09:57   Ct Head Wo Contrast  Result Date: 07/22/2019 CLINICAL DATA:  Stroke follow-up EXAM: CT HEAD WITHOUT CONTRAST TECHNIQUE: Contiguous axial images were obtained from the base of the skull through the vertex without intravenous contrast. COMPARISON:  Head CT 07/20/2019 FINDINGS: Brain: There is no mass, hemorrhage or extra-axial collection. The size and configuration of the ventricles and extra-axial CSF spaces are normal. There is hypoattenuation of the white matter, most commonly indicating chronic small vessel disease. There are old bilateral  basal ganglia small vessel infarcts. Vascular: Atherosclerotic calcification of the vertebral and internal carotid arteries at the skull base. No abnormal hyperdensity of the major intracranial arteries or dural venous sinuses. Skull: The visualized skull base, calvarium and extracranial soft tissues are normal. Sinuses/Orbits: No fluid levels or advanced mucosal thickening of the visualized paranasal sinuses. No  mastoid or middle ear effusion. The orbits are normal. IMPRESSION: Chronic small vessel ischemia and old bilateral basal ganglia small vessel infarcts without acute intracranial abnormality. Electronically Signed   By: Deatra Robinson M.D.   On: 07/22/2019 02:52   Ct Angio Neck W Or Wo Contrast  Result Date: 07/20/2019 CLINICAL DATA:  85 year old female with acute slurred speech, aphasia. Status post CABG today. EXAM: CT ANGIOGRAPHY HEAD AND NECK CT PERFUSION BRAIN TECHNIQUE: Multidetector CT imaging of the head and neck was performed using the standard protocol during bolus administration of intravenous contrast. Multiplanar CT image reconstructions and MIPs were obtained to evaluate the vascular anatomy. Carotid stenosis measurements (when applicable) are obtained utilizing NASCET criteria, using the distal internal carotid diameter as the denominator. Multiphase CT imaging of the brain was performed following IV bolus contrast injection. Subsequent parametric perfusion maps were calculated using RAPID software. CONTRAST:  OMNIPAQUE IOHEXOL 350 MG/ML SOLN COMPARISON:  None. FINDINGS: CT HEAD FINDINGS Brain: Degraded by motion. No intracranial mass effect, hemorrhage or ventriculomegaly. No definite acute cortically based infarct. Best seen on coronal series 8, image 29 is a lacunar infarct of the left caudate. Vascular: No suspicious intracranial vascular hyperdensity. Skull: Motion artifact, No acute osseous abnormality identified. Sinuses/Orbits: Visualized paranasal sinuses and mastoids are  clear. Other: No acute orbit or scalp soft tissue finding. ASPECTS [facility] Stroke Program Early CT Score) Total score (0-10 with 10 being normal): 50 (age indeterminate left caudate lacune). Review of the MIP images confirms the above findings CT Brain Perfusion Findings: ASPECTS: 9 CBF (<30%) Volume: None Perfusion (Tmax>6.0s) volume: None Mismatch Volume: Not applicable Infarction Location:Not applicable CTA NECK Skeleton: Recent sternotomy. Degenerative changes in the cervical spine. Upper chest: Partially visible left chest tube. Small layering left pleural effusion and trace left apically pneumothorax. Trace right pleural effusion and right apically pneumothorax. Partially visible right chest tube. Minor right lung atelectasis. Postoperative changes to the mediastinum. Other neck: Gas in the neck and at the thoracic inlet related to CABG. Marked thyromegaly. Severely enlarged left thyroid lobe which tracks cephalad to the level of the C3 vertebra encompasses 39 by 83 by 53 millimeters (AP by transverse by CC). Heterogeneous enhancement throughout the left lobe. The isthmus is enlarged at the thoracic inlet to 16 millimeters in thickness. The right lobe has a more normal size measuring 31 by 17 by 39 millimeters. Left neck regional mass effect from the thyroid enlargement, including lateral displacement of the left carotid space. No cervical lymphadenopathy. Aortic arch: Recent CABG. Calcified aortic atherosclerosis. 3 vessel arch configuration. Right carotid system: Right ICA origin and bulb atherosclerosis without stenosis. Tortuous right ICA distal to the bulb with mildly kinked appearance. Left carotid system: Left ICA origin calcified plaque without stenosis. Vertebral arteries: No proximal right subclavian artery stenosis. Calcified plaque adjacent to the right vertebral artery origin without stenosis. Patent right vertebral to the skull base without stenosis. No proximal left subclavian artery stenosis.  Calcified plaque near the left vertebral artery origin without stenosis. Mildly dominant left vertebral artery. Tortuous left V1. Left vertebral is patent to the skull base without stenosis. CTA HEAD Posterior circulation: Dominant left V4 segment with calcified plaque but no significant stenosis. Patent vertebrobasilar junction. Patent PICA origins. Irregular basilar artery is patent without stenosis. Patent SCA and PCA origins. Posterior communicating arteries are diminutive or absent. Bilateral PCAs are irregular without stenosis. Anterior circulation: Both ICA siphons are patent. On the left there is a mild to moderate stenosis due to calcified  plaque. On the right there is moderate to severe stenosis at the distal cavernous and supraclinoid segments due to calcified plaque (series 14, images 86 and 89). Patent carotid termini. Patent MCA and ACA origins. Bilateral ACA branches are within normal limits. Left MCA M1 segment and bifurcation are patent without stenosis. No left MCA branch occlusion is identified. Right MCA M1 and bifurcation are patent without stenosis. No right MCA branch occlusion is identified. Venous sinuses: Patent. Anatomic variants: Dominant left vertebral artery. Review of the MIP images confirms the above findings IMPRESSION: 1. Age indeterminate left caudate lacune, favor chronic. No intracranial hemorrhage or mass effect. 2. CT Perfusion does not detect infarct core or ischemic penumbra. 3. CTA is negative for large vessel occlusion. - moderate to severe right ICA siphon stenosis due to calcified plaque. - mild-to-moderate left ICA siphon stenosis. - no other significant arterial stenosis in the head or neck despite atherosclerosis. 4. Recent CABG with postoperative changes to the chest including trace pneumothoraces, chest tubes in place. 5. Marked thyroid goiter, the left lobe is 8.3 cm length. Preliminary report of the above was discussed by telephone with Dr. Roland Rack on  07/20/2019 at 2104 hours, and final report was delayed due to unplanned radiology downtime. Electronically Signed   By: Genevie Ann M.D.   On: 07/20/2019 21:58   Ct Cerebral Perfusion W Contrast  Result Date: 07/20/2019 CLINICAL DATA:  85 year old female with acute slurred speech, aphasia. Status post CABG today. EXAM: CT ANGIOGRAPHY HEAD AND NECK CT PERFUSION BRAIN TECHNIQUE: Multidetector CT imaging of the head and neck was performed using the standard protocol during bolus administration of intravenous contrast. Multiplanar CT image reconstructions and MIPs were obtained to evaluate the vascular anatomy. Carotid stenosis measurements (when applicable) are obtained utilizing NASCET criteria, using the distal internal carotid diameter as the denominator. Multiphase CT imaging of the brain was performed following IV bolus contrast injection. Subsequent parametric perfusion maps were calculated using RAPID software. CONTRAST:  119mL OMNIPAQUE IOHEXOL 350 MG/ML SOLN COMPARISON:  None. FINDINGS: CT HEAD FINDINGS Brain: Degraded by motion. No intracranial mass effect, hemorrhage or ventriculomegaly. No definite acute cortically based infarct. Best seen on coronal series 8, image 29 is a lacunar infarct of the left caudate. Vascular: No suspicious intracranial vascular hyperdensity. Skull: Motion artifact, No acute osseous abnormality identified. Sinuses/Orbits: Visualized paranasal sinuses and mastoids are clear. Other: No acute orbit or scalp soft tissue finding. ASPECTS [facility] Stroke Program Early CT Score) Total score (0-10 with 10 being normal): 34 (age indeterminate left caudate lacune). Review of the MIP images confirms the above findings CT Brain Perfusion Findings: ASPECTS: 9 CBF (<30%) Volume: None Perfusion (Tmax>6.0s) volume: None Mismatch Volume: Not applicable Infarction Location:Not applicable CTA NECK Skeleton: Recent sternotomy. Degenerative changes in the cervical spine. Upper chest: Partially visible  left chest tube. Small layering left pleural effusion and trace left apically pneumothorax. Trace right pleural effusion and right apically pneumothorax. Partially visible right chest tube. Minor right lung atelectasis. Postoperative changes to the mediastinum. Other neck: Gas in the neck and at the thoracic inlet related to CABG. Marked thyromegaly. Severely enlarged left thyroid lobe which tracks cephalad to the level of the C3 vertebra encompasses 39 by 83 by 53 millimeters (AP by transverse by CC). Heterogeneous enhancement throughout the left lobe. The isthmus is enlarged at the thoracic inlet to 16 millimeters in thickness. The right lobe has a more normal size measuring 31 by 17 by 39 millimeters. Left neck regional mass effect from  the thyroid enlargement, including lateral displacement of the left carotid space. No cervical lymphadenopathy. Aortic arch: Recent CABG. Calcified aortic atherosclerosis. 3 vessel arch configuration. Right carotid system: Right ICA origin and bulb atherosclerosis without stenosis. Tortuous right ICA distal to the bulb with mildly kinked appearance. Left carotid system: Left ICA origin calcified plaque without stenosis. Vertebral arteries: No proximal right subclavian artery stenosis. Calcified plaque adjacent to the right vertebral artery origin without stenosis. Patent right vertebral to the skull base without stenosis. No proximal left subclavian artery stenosis. Calcified plaque near the left vertebral artery origin without stenosis. Mildly dominant left vertebral artery. Tortuous left V1. Left vertebral is patent to the skull base without stenosis. CTA HEAD Posterior circulation: Dominant left V4 segment with calcified plaque but no significant stenosis. Patent vertebrobasilar junction. Patent PICA origins. Irregular basilar artery is patent without stenosis. Patent SCA and PCA origins. Posterior communicating arteries are diminutive or absent. Bilateral PCAs are irregular  without stenosis. Anterior circulation: Both ICA siphons are patent. On the left there is a mild to moderate stenosis due to calcified plaque. On the right there is moderate to severe stenosis at the distal cavernous and supraclinoid segments due to calcified plaque (series 14, images 86 and 89). Patent carotid termini. Patent MCA and ACA origins. Bilateral ACA branches are within normal limits. Left MCA M1 segment and bifurcation are patent without stenosis. No left MCA branch occlusion is identified. Right MCA M1 and bifurcation are patent without stenosis. No right MCA branch occlusion is identified. Venous sinuses: Patent. Anatomic variants: Dominant left vertebral artery. Review of the MIP images confirms the above findings IMPRESSION: 1. Age indeterminate left caudate lacune, favor chronic. No intracranial hemorrhage or mass effect. 2. CT Perfusion does not detect infarct core or ischemic penumbra. 3. CTA is negative for large vessel occlusion. - moderate to severe right ICA siphon stenosis due to calcified plaque. - mild-to-moderate left ICA siphon stenosis. - no other significant arterial stenosis in the head or neck despite atherosclerosis. 4. Recent CABG with postoperative changes to the chest including trace pneumothoraces, chest tubes in place. 5. Marked thyroid goiter, the left lobe is 8.3 cm length. Preliminary report of the above was discussed by telephone with Dr. Ritta Slot on 07/20/2019 at 2104 hours, and final report was delayed due to unplanned radiology downtime. Electronically Signed   By: Odessa Fleming M.D.   On: 07/20/2019 21:58    Cardiac Studies   none  Patient Profile     85 y.o. female admitted with CHF and found to have 2 vessel CAD and LV dysfunction, s/p CABG with post op atrial fib and stroke, now recovered  Assessment & Plan    1. PAF - she is back in NSR. Continue amiodarone. If she maintains NSR today, consider switching to po tomorrow. 2. Stroke - her symptoms have  resolved. Continue Eliquis. 3. CAD - she is s/p CABG. Continue post op care.  For questions or updates, please contact [facility] Please consult [email or web address] for contact info under Cardiology/STEMI.   Signed, Lewayne Bunting, MD  07/22/2019, 10:26 AM  Patient ID: Timoteo Gaul, female   DOB: 1931/10/20, 85 y.o.   MRN: [id number]

## 2019-07-23 LAB — COOXEMETRY PANEL
Carboxyhemoglobin: 1.4 % (ref 0.5–1.5)
Methemoglobin: 0.8 % (ref 0.0–1.5)
O2 Saturation: 56.5 %
Total hemoglobin: 8.6 g/dL — ABNORMAL LOW (ref 12.0–16.0)

## 2019-07-23 LAB — BASIC METABOLIC PANEL
Anion gap: 10 (ref 5–15)
Anion gap: 10 (ref 5–15)
BUN: 26 mg/dL — ABNORMAL HIGH (ref 8–23)
BUN: 23 mg/dL (ref 8–23)
CO2: 24 mmol/L (ref 22–32)
CO2: 24 mmol/L (ref 22–32)
Calcium: 9 mg/dL (ref 8.9–10.3)
Calcium: 8.8 mg/dL — ABNORMAL LOW (ref 8.9–10.3)
Chloride: 98 mmol/L (ref 98–111)
Chloride: 96 mmol/L — ABNORMAL LOW (ref 98–111)
Creatinine, Ser: 0.71 mg/dL (ref 0.44–1.00)
Creatinine, Ser: 0.7 mg/dL (ref 0.44–1.00)
GFR calc Af Amer: >60 mL/min (ref >60)
GFR calc Af Amer: >60 mL/min (ref >60)
GFR calc non Af Amer: >60 mL/min (ref >60)
GFR calc non Af Amer: >60 mL/min (ref >60)
Glucose, Bld: 89 mg/dL (ref 70–99)
Glucose, Bld: 99 mg/dL (ref 70–99)
Potassium: 3.8 mmol/L (ref 3.5–5.1)
Potassium: 3.4 mmol/L — ABNORMAL LOW (ref 3.5–5.1)
Sodium: 132 mmol/L — ABNORMAL LOW (ref 135–145)
Sodium: 130 mmol/L — ABNORMAL LOW (ref 135–145)

## 2019-07-23 LAB — GLUCOSE, CAPILLARY
Glucose-Capillary: 89 mg/dL (ref 70–99)
Glucose-Capillary: 85 mg/dL (ref 70–99)
Glucose-Capillary: 113 mg/dL — ABNORMAL HIGH (ref 70–99)
Glucose-Capillary: 121 mg/dL — ABNORMAL HIGH (ref 70–99)
Glucose-Capillary: 105 mg/dL — ABNORMAL HIGH (ref 70–99)

## 2019-07-23 MED ORDER — BISMUTH SUBSALICYLATE 262 MG PO CHEW
524.0000 mg | CHEWABLE_TABLET | Freq: Three times a day (TID) | ORAL | Status: DC
  Administered 2019-07-23 – 2019-07-24 (×2): 524 mg via ORAL
  Filled 2019-07-23 (×4): qty 2

## 2019-07-23 MED ORDER — INSULIN ASPART 100 UNIT/ML ~~LOC~~ SOLN: 0.0000 [IU] | Freq: Every day | SUBCUTANEOUS | Status: DC

## 2019-07-23 MED ORDER — INSULIN ASPART 100 UNIT/ML ~~LOC~~ SOLN: 0.0000 [IU] | Freq: Three times a day (TID) | SUBCUTANEOUS | Status: DC

## 2019-07-23 NOTE — Progress Notes (Signed)
Pt transferred to 4E [id number] via [employer]. Vitals per flowsheet.

## 2019-07-23 NOTE — Progress Notes (Signed)
Progress Note  Patient Name: Lacey Coleman Date of Encounter: 07/23/2019  Primary Cardiologist: No primary care provider on file.   Subjective   Denies chest pain or sob.  Inpatient Medications    Scheduled Meds:  acetaminophen  1,000 mg Oral Q6H   Or   acetaminophen (TYLENOL) oral liquid 160 mg/5 mL  1,000 mg Per Tube Q6H   amiodarone  200 mg Oral BID   apixaban  5 mg Oral BID   aspirin EC  81 mg Oral Daily   atorvastatin  40 mg Oral q1800   bisacodyl  10 mg Oral Daily   Or   bisacodyl  10 mg Rectal Daily   Chlorhexidine Gluconate Cloth  6 each Topical Daily   colchicine  0.3 mg Oral BID   docusate sodium  200 mg Oral Daily   insulin aspart  0-24 Units Subcutaneous Q4H   magnesium oxide  400 mg Oral BID   mouth rinse  15 mL Mouth Rinse BID   metoprolol tartrate  12.5 mg Oral BID   pantoprazole  40 mg Oral Daily   sodium chloride flush  3 mL Intravenous Q12H   Continuous Infusions:  sodium chloride Stopped (07/20/19 0950)   sodium chloride     sodium chloride 20 mL/hr at 07/19/19 1415   lactated ringers 20 mL/hr at 07/21/19 0100   lactated ringers     PRN Meds: sodium chloride, metoprolol tartrate, ondansetron (ZOFRAN) IV, sodium chloride flush   Vital Signs    Vitals:   07/23/19 0500 07/23/19 0600 07/23/19 0700 07/23/19 0800  BP: (!) 118/59 117/73 (!) 115/58 (!) 119/48  Pulse: 67 68 68 73  Resp:      Temp:      TempSrc:      SpO2: 100% 97% 98% 96%  Weight: 65.7 kg     Height:        Intake/Output Summary (Last 24 hours) at 07/23/2019 1034 Last data filed at 07/23/2019 0800 Gross per 24 hour  Intake 248.61 ml  Output 295 ml  Net -46.39 ml   Filed Weights   07/21/19 0449 07/22/19 0500 07/23/19 0500  Weight: 67.7 kg 66.1 kg 65.7 kg    Telemetry    nsr with brief PAF - Personally Reviewed  ECG    none - Personally Reviewed  Physical Exam   GEN: No acute distress.   Neck: No JVD Cardiac: RRR, no murmurs, rubs, or  gallops.  Respiratory: Clear to auscultation bilaterally. GI: Soft, nontender, non-distended  MS: No edema; No deformity. Neuro:  Nonfocal  Psych: Normal affect   Labs    Chemistry Recent Labs  Lab 07/21/19 0444 07/22/19 0444 07/23/19 0359  NA 133* 132* 132*  K 3.9 3.6 3.8  CL 104 97* 98  CO2 23 25 24   GLUCOSE 117* 110* 89  BUN 15 21 26*  CREATININE 0.79 0.69 0.71  CALCIUM 9.3 9.0 9.0  GFRNONAA >60 >60 >60  GFRAA >60 >60 >60  ANIONGAP 6 10 10      Hematology Recent Labs  Lab 07/20/19 1600 07/21/19 2315 07/22/19 0444  WBC 12.0* 17.2* 11.5*  RBC 3.86* 3.95 3.60*  HGB 9.7* 9.8* 8.7*  HCT 30.9* 31.0* 28.5*  MCV 80.1 78.5* 79.2*  MCH 25.1* 24.8* 24.2*  MCHC 31.4 31.6 30.5  RDW 16.9* 17.5* 17.4*  PLT 181 245 197    Cardiac EnzymesNo results for input(s): TROPONINI in the last 168 hours. No results for input(s): TROPIPOC in the last 168 hours.  BNPNo results for input(s): BNP, PROBNP in the last 168 hours.   DDimer No results for input(s): DDIMER in the last 168 hours.   Radiology    Ct Head Wo Contrast  Result Date: 07/22/2019 CLINICAL DATA:  Stroke follow-up EXAM: CT HEAD WITHOUT CONTRAST TECHNIQUE: Contiguous axial images were obtained from the base of the skull through the vertex without intravenous contrast. COMPARISON:  Head CT 07/20/2019 FINDINGS: Brain: There is no mass, hemorrhage or extra-axial collection. The size and configuration of the ventricles and extra-axial CSF spaces are normal. There is hypoattenuation of the white matter, most commonly indicating chronic small vessel disease. There are old bilateral basal ganglia small vessel infarcts. Vascular: Atherosclerotic calcification of the vertebral and internal carotid arteries at the skull base. No abnormal hyperdensity of the major intracranial arteries or dural venous sinuses. Skull: The visualized skull base, calvarium and extracranial soft tissues are normal. Sinuses/Orbits: No fluid levels or  advanced mucosal thickening of the visualized paranasal sinuses. No mastoid or middle ear effusion. The orbits are normal. IMPRESSION: Chronic small vessel ischemia and old bilateral basal ganglia small vessel infarcts without acute intracranial abnormality. Electronically Signed   By: Ulyses Jarred M.D.   On: 07/22/2019 02:52   Dg Chest Port 1 View  Result Date: 07/22/2019 CLINICAL DATA:  Postop CABG EXAM: PORTABLE CHEST 1 VIEW COMPARISON:  07/21/2019 FINDINGS: Stable bilateral chest tubes. Trace left basilar pneumothorax is suspected. Mild left basilar scarring/atelectasis. Trace right pleural effusion. Cardiomegaly. Thoracic aortic atherosclerosis. Postsurgical changes related to prior CABG. Median sternotomy. Midline skin staples. Stable right IJ vascular sheath. IMPRESSION: Stable bilateral chest tubes. Trace left basilar pneumothorax is suspected. Attention on follow-up is suggested. Electronically Signed   By: Julian Hy M.D.   On: 07/22/2019 11:40    Cardiac Studies   none  Patient Profile     85 y.o. female admitted with CHF and found to have extensive CAD, s/p CABG with LV dysfunction with post op atrial fib and stroke, now recovered  Assessment & Plan    1. Post op atrial fib - she is in NSR and has been stable on amiodarone. 2. Stroke - her symptoms have resolved.  3. CAD - s/p CABG and stable.     For questions or updates, please contact [place] Please consult [email or web address] for contact info under Cardiology/STEMI.   Signed, Cristopher Peru, MD  07/23/2019, 10:34 AM  Patient ID: Lacey Coleman, female   DOB: 01/25/1931, 85 y.o.   MRN: [id number]

## 2019-07-23 NOTE — Discharge Instructions (Signed)
Discharge Instructions:  1. You may shower, please wash incisions daily with soap and water and keep dry.  If you wish to cover wounds with dressing you may do so but please keep clean and change daily.  No tub baths or swimming until incisions have completely healed.  If your incisions become red or develop any drainage please call our office at [phone]  2. No Driving until cleared by Dr. Atkins office and you are no longer using narcotic pain medications  3. Monitor your weight daily. Please use the same scale and weigh at same time... If you gain 5-10 lbs in 48 hours with associated lower extremity swelling, please contact our office at [phone]  4. Fever of 101.5 for at least 24 hours with no source, please contact our office at [phone]  5. Activity- up as tolerated, please walk at least 3 times per day.  Avoid strenuous activity, no lifting, pushing, or pulling with your arms over 8-10 lbs for a minimum of 6 weeks  6. If any questions or concerns arise, please do not hesitate to contact our office at [phone]    Information on my medicine - ELIQUIS (apixaban)  Why was Eliquis prescribed for you? Eliquis was prescribed for you to reduce the risk of a blood clot forming that can cause a stroke if you have a medical condition called atrial fibrillation (a type of irregular heartbeat).  What do You need to know about Eliquis ? Take your Eliquis TWICE DAILY - one tablet in the morning and one tablet in the evening with or without food. If you have difficulty swallowing the tablet whole please discuss with your pharmacist how to take the medication safely.  Take Eliquis exactly as prescribed by your doctor and DO NOT stop taking Eliquis without talking to the doctor who prescribed the medication.  Stopping may increase your risk of developing a stroke.  Refill your prescription before you run out.  After discharge, you should have regular check-up appointments with  your healthcare provider that is prescribing your Eliquis.  In the future your dose may need to be changed if your kidney function or weight changes by a significant amount or as you get older.  What do you do if you miss a dose? If you miss a dose, take it as soon as you remember on the same day and resume taking twice daily.  Do not take more than one dose of ELIQUIS at the same time to make up a missed dose.  Important Safety Information A possible side effect of Eliquis is bleeding. You should call your healthcare provider right away if you experience any of the following: ? Bleeding from an injury or your nose that does not stop. ? Unusual colored urine (red or dark Zemaitis) or unusual colored stools (red or black). ? Unusual bruising for unknown reasons. ? A serious fall or if you hit your head (even if there is no bleeding).  Some medicines may interact with Eliquis and might increase your risk of bleeding or clotting while on Eliquis. To help avoid this, consult your healthcare provider or pharmacist prior to using any new prescription or non-prescription medications, including herbals, vitamins, non-steroidal anti-inflammatory drugs (NSAIDs) and supplements.  This website has more information on Eliquis (apixaban): http://www.eliquis.com/eliquis/home 

## 2019-07-23 NOTE — Progress Notes (Signed)
4 Days Post-Op Procedure(s) (LRB): CORONARY ARTERY BYPASS GRAFTING (CABG) x 2; Using Left Internal Mammary Artery (LIMA) and Right Leg Greater Saphenous Vein (SVG) harvested endoscopically; LIMA to LAD, SVG to Circ. (N/A) INDOCYANINE GREEN FLUORESCENCE IMAGING (ICG) (N/A) TRANSESOPHAGEAL ECHOCARDIOGRAM (TEE) (N/A) Subjective: No complaints; wants to go home  Objective: Vital signs in last 24 hours: Temp:  [97.4 F (36.3 C)-98.2 F (36.8 C)] 97.8 F (36.6 C) (09/20 0300) Pulse Rate:  [53-110] 73 (09/20 0800) Cardiac Rhythm: Normal sinus rhythm (09/20 0800) Resp:  [19-27] 23 (09/19 1800) BP: (95-122)/(48-97) 119/48 (09/20 0800) SpO2:  [96 %-100 %] 96 % (09/20 0800) Weight:  [65.7 kg] 65.7 kg (09/20 0500)  Hemodynamic parameters for last 24 hours:    Intake/Output from previous day: 09/19 0701 - 09/20 0700 In: 538.6 [P.O.:480; I.V.:58.6] Out: 435 [Urine:375; Chest Tube:60] Intake/Output this shift: No intake/output data recorded.  General appearance: alert and cooperative Neurologic: intact Heart: regular rate and rhythm, S1, S2 normal, no murmur, click, rub or gallop Lungs: clear to auscultation bilaterally Wound: c/d/i  Lab Results: Recent Labs    07/21/19 2315 07/22/19 0444  WBC 17.2* 11.5*  HGB 9.8* 8.7*  HCT 31.0* 28.5*  PLT 245 197   BMET:  Recent Labs    07/22/19 0444 07/23/19 0359  NA 132* 132*  K 3.6 3.8  CL 97* 98  CO2 25 24  GLUCOSE 110* 89  BUN 21 26*  CREATININE 0.69 0.71  CALCIUM 9.0 9.0    PT/INR: No results for input(s): LABPROT, INR in the last 72 hours. ABG    Component Value Date/Time   PHART 7.383 07/19/2019 2141   HCO3 21.7 07/19/2019 2141   TCO2 23 07/19/2019 2141   ACIDBASEDEF 3.0 (H) 07/19/2019 2141   O2SAT 56.5 07/23/2019 0400   CBG (last 3)  Recent Labs    07/22/19 2304 07/23/19 0354 07/23/19 0816  GLUCAP 89 85 113*    Assessment/Plan: S/P Procedure(s) (LRB): CORONARY ARTERY BYPASS GRAFTING (CABG) x 2; Using Left  Internal Mammary Artery (LIMA) and Right Leg Greater Saphenous Vein (SVG) harvested endoscopically; LIMA to LAD, SVG to Circ. (N/A) INDOCYANINE GREEN FLUORESCENCE IMAGING (ICG) (N/A) TRANSESOPHAGEAL ECHOCARDIOGRAM (TEE) (N/A) Mobilize d/c pacing wires d/c foley; d/c central line; anticipate discharge tomorrow  Transfer orders written   LOS: 6 days    Wonda Olds 07/23/2019

## 2019-07-23 NOTE — Progress Notes (Signed)
Pt arrived to the floor with the nurse via a wheel Chair.Patient alert and oriented, stand up scale weight obtained, CHG bath given and heart monitor applied, CCMD notified. Patient oriented to room and equipment. Patient denies any pain. We'll continue to monitor.

## 2019-07-24 ENCOUNTER — Ambulatory Visit: Payer: Medicare Other | Admitting: Family

## 2019-07-24 ENCOUNTER — Other Ambulatory Visit: Payer: Self-pay | Admitting: Surgical

## 2019-07-24 LAB — BASIC METABOLIC PANEL
Anion gap: 10 (ref 5–15)
BUN: 20 mg/dL (ref 8–23)
CO2: 23 mmol/L (ref 22–32)
Calcium: 8.8 mg/dL — ABNORMAL LOW (ref 8.9–10.3)
Chloride: 101 mmol/L (ref 98–111)
Creatinine, Ser: 0.65 mg/dL (ref 0.44–1.00)
GFR calc Af Amer: >60 mL/min (ref >60)
GFR calc non Af Amer: >60 mL/min (ref >60)
Glucose, Bld: 80 mg/dL (ref 70–99)
Potassium: 3.4 mmol/L — ABNORMAL LOW (ref 3.5–5.1)
Sodium: 134 mmol/L — ABNORMAL LOW (ref 135–145)

## 2019-07-24 LAB — GLUCOSE, CAPILLARY
Glucose-Capillary: 82 mg/dL (ref 70–99)
Glucose-Capillary: 88 mg/dL (ref 70–99)

## 2019-07-24 LAB — SUSCEPTIBILITY, AER + ANAEROB

## 2019-07-24 LAB — SUSCEPTIBILITY RESULT

## 2019-07-24 MED ORDER — POTASSIUM CHLORIDE 20 MEQ PO PACK
40.0000 meq | PACK | Freq: Once | ORAL | Status: AC
  Administered 2019-07-24: 40 meq via ORAL
  Filled 2019-07-24: qty 2

## 2019-07-24 MED ORDER — APIXABAN 5 MG PO TABS: 5.0000 mg | ORAL_TABLET | Freq: Two times a day (BID) | ORAL | 1 refills | Status: AC

## 2019-07-24 MED ORDER — POTASSIUM CHLORIDE ER 20 MEQ PO TBCR: 20.0000 meq | EXTENDED_RELEASE_TABLET | Freq: Every day | ORAL | 0 refills | Status: DC

## 2019-07-24 MED ORDER — ACETAMINOPHEN 500 MG PO TABS: ORAL_TABLET | ORAL | 0 refills | Status: AC

## 2019-07-24 MED ORDER — METOPROLOL TARTRATE 25 MG PO TABS: 12.5000 mg | ORAL_TABLET | Freq: Two times a day (BID) | ORAL | 1 refills | Status: AC

## 2019-07-24 MED ORDER — FUROSEMIDE 40 MG PO TABS: 40.0000 mg | ORAL_TABLET | Freq: Every day | ORAL | 0 refills | Status: DC

## 2019-07-24 MED ORDER — ASPIRIN 81 MG PO TBEC: 81.0000 mg | DELAYED_RELEASE_TABLET | Freq: Every day | ORAL | Status: AC

## 2019-07-24 MED ORDER — AMIODARONE HCL 200 MG PO TABS: 200.0000 mg | ORAL_TABLET | Freq: Two times a day (BID) | ORAL | 1 refills | Status: DC

## 2019-07-24 NOTE — Progress Notes (Signed)
[address]       [place] [address]             [phone]      5 Days Post-Op Procedure(s) (LRB): CORONARY ARTERY BYPASS GRAFTING (CABG) x 2; Using Left Internal Mammary Artery (LIMA) and Right Leg Greater Saphenous Vein (SVG) harvested endoscopically; LIMA to LAD, SVG to Circ. (N/A) INDOCYANINE GREEN FLUORESCENCE IMAGING (ICG) (N/A) TRANSESOPHAGEAL ECHOCARDIOGRAM (TEE) (N/A) Subjective: Feels well  Objective: Vital signs in last 24 hours: Temp:  [97.7 F (36.5 C)-98.2 F (36.8 C)] 98.2 F (36.8 C) (09/21 0519) Pulse Rate:  [65-87] 83 (09/21 0519) Cardiac Rhythm: Normal sinus rhythm (09/21 0519) Resp:  [18-21] 21 (09/21 0519) BP: (99-138)/(47-81) 132/59 (09/21 0519) SpO2:  [96 %-100 %] 100 % (09/20 2115) Weight:  [64.9 kg-65.5 kg] 64.9 kg (09/21 0519)  Hemodynamic parameters for last 24 hours:    Intake/Output from previous day: 09/20 0701 - 09/21 0700 In: 0  Out: 100 [Urine:100] Intake/Output this shift: No intake/output data recorded.  General appearance: alert, cooperative and no distress Heart: regular rate and rhythm and occas extrasystole Lungs: mildly dim in bases Abdomen: benign Extremities: minor edema Wound: incis healing well  Lab Results: Recent Labs    07/21/19 2315 07/22/19 0444  WBC 17.2* 11.5*  HGB 9.8* 8.7*  HCT 31.0* 28.5*  PLT 245 197   BMET:  Recent Labs    07/23/19 2007 07/24/19 0317  NA 130* 134*  K 3.4* 3.4*  CL 96* 101  CO2 24 23  GLUCOSE 99 80  BUN 23 20  CREATININE 0.70 0.65  CALCIUM 8.8* 8.8*    PT/INR: No results for input(s): LABPROT, INR in the last 72 hours. ABG    Component Value Date/Time   PHART 7.383 07/19/2019 2141   HCO3 21.7 07/19/2019 2141   TCO2 23 07/19/2019 2141   ACIDBASEDEF 3.0 (H) 07/19/2019 2141   O2SAT 56.5 07/23/2019 0400   CBG (last 3)  Recent Labs    07/23/19 1717 07/23/19 2125 07/24/19 0613  GLUCAP 105* 82 88    Meds Scheduled Meds: . acetaminophen   1,000 mg Oral Q6H   Or  . acetaminophen (TYLENOL) oral liquid 160 mg/5 mL  1,000 mg Per Tube Q6H  . amiodarone  200 mg Oral BID  . apixaban  5 mg Oral BID  . aspirin EC  81 mg Oral Daily  . atorvastatin  40 mg Oral q1800  . bisacodyl  10 mg Oral Daily   Or  . bisacodyl  10 mg Rectal Daily  . bismuth subsalicylate  524 mg Oral TID AC & HS  . Chlorhexidine Gluconate Cloth  6 each Topical Daily  . insulin aspart  0-5 Units Subcutaneous QHS  . insulin aspart  0-9 Units Subcutaneous TID WC  . magnesium oxide  400 mg Oral BID  . mouth rinse  15 mL Mouth Rinse BID  . metoprolol tartrate  12.5 mg Oral BID  . pantoprazole  40 mg Oral Daily  . sodium chloride flush  3 mL Intravenous Q12H   Continuous Infusions: PRN Meds:.metoprolol tartrate, ondansetron (ZOFRAN) IV, sodium chloride flush  Xrays No results found.  Assessment/Plan: S/P Procedure(s) (LRB): CORONARY ARTERY BYPASS GRAFTING (CABG) x 2; Using Left Internal Mammary Artery (LIMA) and Right Leg Greater Saphenous Vein (SVG) harvested endoscopically; LIMA to LAD, SVG to Circ. (N/A) INDOCYANINE GREEN FLUORESCENCE IMAGING (ICG) (N/A) TRANSESOPHAGEAL ECHOCARDIOGRAM (TEE) (N/A)  1 conts to do well 2 hemodyn stable  in sinus with occ PVC, no further afib 3 replace K+ for 3.4 4 conts apixaban 5 stable for d/c, reviewed instructions   LOS: 7 days    Lacey Coleman [facility] 07/24/2019 Pager [phone]

## 2019-07-24 NOTE — Progress Notes (Addendum)
Advanced Heart Failure Rounding Note  PCP-Cardiologist: No primary care provider on file.   Subjective:    Underwent 2v CABG 9/16 (LIMA -> LAD, SVG -> OM) with Dr. Vickey Sages.   Denies SOB. Denies pain.   Objective:   Weight Range: 64.9 kg Body mass index is 23.08 kg/m.   Vital Signs:   Temp:  [97.7 F (36.5 C)-98.2 F (36.8 C)] 98 F (36.7 C) (09/21 0845) Pulse Rate:  [66-124] 83 (09/21 0948) Resp:  [18-21] 18 (09/21 0845) BP: (109-138)/(50-81) 130/72 (09/21 0845) SpO2:  [94 %-100 %] 94 % (09/21 0845) Weight:  [64.9 kg-65.5 kg] 64.9 kg (09/21 0519) Last BM Date: 07/24/19  Weight change: Filed Weights   07/23/19 0500 07/23/19 2115 07/24/19 0519  Weight: 65.7 kg 65.5 kg 64.9 kg    Intake/Output:   Intake/Output Summary (Last 24 hours) at 07/24/2019 1023 Last data filed at 07/24/2019 0900 Gross per 24 hour  Intake 180 ml  Output -  Net 180 ml      Physical Exam    General:   No resp difficulty. SItting on the side of the bed.  HEENT: normal Neck: supple. JVP 8-9. Carotids 2+ bilat; no bruits. No lymphadenopathy or thryomegaly appreciated. Cor: PMI nondisplaced. Regular rate & rhythm. No rubs, gallops or murmurs. Sternal staple s Lungs: clear Abdomen: soft, nontender, nondistended. No hepatosplenomegaly. No bruits or masses. Good bowel sounds. Extremities: no cyanosis, clubbing, rash, R and LLE 1+ edema Neuro: alert & orientedx3, cranial nerves grossly intact. moves all 4 extremities w/o difficulty. Affect pleasant   Telemetry   NSR - A Fib.   Labs    CBC Recent Labs    07/21/19 2315 07/22/19 0444  WBC 17.2* 11.5*  NEUTROABS  --  9.1*  HGB 9.8* 8.7*  HCT 31.0* 28.5*  MCV 78.5* 79.2*  PLT 245 197   Basic Metabolic Panel Recent Labs    60/10/93 2007 07/24/19 0317  NA 130* 134*  K 3.4* 3.4*  CL 96* 101  CO2 24 23  GLUCOSE 99 80  BUN 23 20  CREATININE 0.70 0.65  CALCIUM 8.8* 8.8*   Liver Function Tests No results for input(s): AST,  ALT, ALKPHOS, BILITOT, PROT, ALBUMIN in the last 72 hours. No results for input(s): LIPASE, AMYLASE in the last 72 hours. Cardiac Enzymes No results for input(s): CKTOTAL, CKMB, CKMBINDEX, TROPONINI in the last 72 hours.  BNP: BNP (last 3 results) Recent Labs    07/16/19 0141  BNP 2,027.0*    ProBNP (last 3 results) No results for input(s): PROBNP in the last 8760 hours.   D-Dimer No results for input(s): DDIMER in the last 72 hours. Hemoglobin A1C No results for input(s): HGBA1C in the last 72 hours. Fasting Lipid Panel No results for input(s): CHOL, HDL, LDLCALC, TRIG, CHOLHDL, LDLDIRECT in the last 72 hours. Thyroid Function Tests No results for input(s): TSH, T4TOTAL, T3FREE, THYROIDAB in the last 72 hours.  Invalid input(s): FREET3  Other results:   Imaging    No results found.   Medications:     Scheduled Medications: . acetaminophen  1,000 mg Oral Q6H   Or  . acetaminophen (TYLENOL) oral liquid 160 mg/5 mL  1,000 mg Per Tube Q6H  . amiodarone  200 mg Oral BID  . apixaban  5 mg Oral BID  . aspirin EC  81 mg Oral Daily  . atorvastatin  40 mg Oral q1800  . bisacodyl  10 mg Oral Daily   Or  .  bisacodyl  10 mg Rectal Daily  . bismuth subsalicylate  409 mg Oral TID AC & HS  . Chlorhexidine Gluconate Cloth  6 each Topical Daily  . insulin aspart  0-5 Units Subcutaneous QHS  . insulin aspart  0-9 Units Subcutaneous TID WC  . magnesium oxide  400 mg Oral BID  . mouth rinse  15 mL Mouth Rinse BID  . metoprolol tartrate  12.5 mg Oral BID  . pantoprazole  40 mg Oral Daily  . sodium chloride flush  3 mL Intravenous Q12H    Infusions:   PRN Medications: metoprolol tartrate, ondansetron (ZOFRAN) IV, sodium chloride flush     Assessment/Plan   1. Acute coronary syndrome - Severe Multivessel Coronary Artery Disease - HS Trop 81>191>478 - LHC - distal LM lesion 90% stenosed, proximal circumflex 80% stenosis, proximal LAD to mid LAD 90% stenosed, first  diagonal lesion 80% stenosed, mid circumflex 100% stenosed, proximal RCA 90% stenosed, and proximal RCA to mid RCA 100% stenosed. - s/p CABG x 2 (LIMA -> SVG -> OM) on 9/16  - No chest pain.  - ASA/statin   2. Acute Systolic HF, ICM  - 2/95 ECHO EF 30-35% RV ok  - LHC/RHC multivessel disease, CI 1.9, PCWP 25.  - s/p CABG 9/16. Stable hemodynamics --On low dose bb.  Volume status mildly elevated. Needs to go home on 40 mg po lasix + 20 meq potassium daily. .  3. Paroxysmal A fib RVR - pre-op  - Back in NSR with brief episodes of NSR.  Continue amio 200 mg twice a day.  -Continue eliquis 5 mg twice a day    4. CVA CT with left caudate stroke.  Neuro following. Today she has ongoing expressive aphasia.  Adding eliquis per Dr Orvan Seen.  - Continue statin.  - PT/OT/Speech consult.    She has been discharged by CT surgery. Needs to go home 40 mg lasix daily + 20 meq potassium daily. Case Manager contacted about eliquis.    We will set up follow up in the HF clinic. I have asked her to call HF clinic if weight goes up. We will need to continue to optimize HF meds.    Length of Stay: 7  Amy Clegg, NP  07/24/2019, 10:23 AM  Advanced Heart Failure Team Pager [phone] (M-F; 7a - 4p)  Please contact [facility] for night-coverage after hours (4p -7a ) and weekends on [email or web address]  Patient seen and examined with the above-signed Advanced Practice Provider and/or Housestaff. I personally reviewed laboratory data, imaging studies and relevant notes. I independently examined the patient and formulated the important aspects of the plan. I have edited the note to reflect any of my changes or salient points. I have personally discussed the plan with the patient and/or family.  She is much better today. Maintaining NSR. Aphasia improved. Agree with continuing amio and Eliquis. Weight still up 10 pounds. Will likely need diuretics for only a short time. Will follow up in clinic soon.   Glori Bickers, MD  6:11 PM

## 2019-07-24 NOTE — Progress Notes (Signed)
Physical Therapy Treatment Patient Details Name: Lacey Coleman MRN: [id number] DOB: 1931/04/05 Today's Date: 07/24/2019    History of Present Illness 85 y.o. 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 She had brief peri-operative afib at that time.  Presented with aphasia later that day and code-stroke was activated.   CT head showed well-defined stroke in the head of the left caudate nucleus.    PT Comments    Pt preparing for d/c home today, willing to walk with therapy before she goes. Pt able to recall sternal precautions and only needs minor cuing for maintaining with mobility. Pt supervision for bed mobility and min guard for sit>stand from bed. Pt refuses RW use during ambulation. Initially ambulates with min guard, however as she fatigues, she has increased difficulty clearing R LE and begins scissoring and has LoB requiring minA to steady. Pt ambulates remainder with minA. Pt agrees to RW usage at home. PT recommends HHPT fro work on dynamic balance however pt refuses.       Follow Up Recommendations  Home health PT     Equipment Recommendations  None recommended by PT       Precautions / Restrictions Precautions Precautions: Sternal;Fall;Other (comment) Restrictions Weight Bearing Restrictions: Yes(sternal precautions)    Mobility  Bed Mobility Overal bed mobility: Needs Assistance Bed Mobility: Supine to Sit;Sit to Supine     Supine to sit: HOB elevated;Supervision     General bed mobility comments: supervision for long sitting and then swinging LE off bed  Transfers Overall transfer level: Needs assistance Equipment used: None Transfers: Sit to/from Stand Sit to Stand: Min guard         General transfer comment: min guard for safety, vc for sternal precautions  Ambulation/Gait Ambulation/Gait assistance: Min assist;Min guard Gait Distance (Feet): 275 Feet Assistive device: None Gait Pattern/deviations: Step-through  pattern;Scissoring;Shuffle Gait velocity: decreased Gait velocity interpretation: <1.31 ft/sec, indicative of household ambulator General Gait Details: pt states she does not need a RW, however she states she would use an Scientist, research (medical), educate that she needs to progress to RW, again states she doesn't need it. initally min guard for ambulation, however has decreased R LE strength with increasing difficulty with R foot clearance and scissoring, then has LoB requiring minA for steadying, min A maintained throughout the rest of ambulation for steadying, pt agreeable to RW use at home         Balance Overall balance assessment: Needs assistance Sitting-balance support: No upper extremity supported;Feet supported Sitting balance-Leahy Scale: Good     Standing balance support: During functional activity;No upper extremity supported Standing balance-Leahy Scale: Fair                              Cognition Arousal/Alertness: Awake/alert Behavior During Therapy: Impulsive Overall Cognitive Status: Impaired/Different from baseline Area of Impairment: Problem solving;Following commands;Safety/judgement                       Following Commands: Follows multi-step commands with increased time Safety/Judgement: Decreased awareness of deficits;Decreased awareness of safety   Problem Solving: Difficulty sequencing General Comments: impulsive         General Comments General comments (skin integrity, edema, etc.): ambulated on RA with SaO2 >95%O2, HR 80-88bpm throughout ambulation      Pertinent Vitals/Pain Pain Assessment: Faces Faces Pain Scale: Hurts a little bit Pain Location: chest/incision Pain Descriptors / Indicators: Grimacing;Sore  Pain Intervention(s): Limited activity within patient's tolerance;Monitored during session;Repositioned           PT Goals (current goals can now be found in the care plan section) Acute Rehab PT Goals Patient Stated Goal: home  per daughter PT Goal Formulation: With patient/family Time For Goal Achievement: 08/04/19 Potential to Achieve Goals: Good Progress towards PT goals: Progressing toward goals    Frequency    Min 3X/week      PT Plan Discharge plan needs to be updated    Co-evaluation PT/OT/SLP Co-Evaluation/Treatment: Yes            AM-PAC PT "6 Clicks" Mobility   Outcome Measure  Help needed turning from your back to your side while in a flat bed without using bedrails?: None Help needed moving from lying on your back to sitting on the side of a flat bed without using bedrails?: None Help needed moving to and from a bed to a chair (including a wheelchair)?: None Help needed standing up from a chair using your arms (e.g., wheelchair or bedside chair)?: None Help needed to walk in hospital room?: A Little Help needed climbing 3-5 steps with a railing? : A Lot 6 Click Score: 21    End of Session Equipment Utilized During Treatment: Gait belt Activity Tolerance: Patient limited by pain;Patient limited by fatigue Patient left: in bed;with call bell/phone within reach(Cardiac Rehab therapist) Nurse Communication: Mobility status PT Visit Diagnosis: Other abnormalities of gait and mobility (R26.89);Pain Pain - part of body: (chest incision )     Time: 8144-8185 PT Time Calculation (min) (ACUTE ONLY): 17 min  Charges:  $Gait Training: 8-22 mins                     Tashanda Fuhrer B. Migdalia Dk PT, DPT Acute Rehabilitation Services Pager [phone] Office [phone]    [place] 07/24/2019, 10:34 AM

## 2019-07-24 NOTE — Progress Notes (Signed)
[id number] Pt just walked with PT down the hall. Pt knows she is to use rolling walker at home until steadier. She has one available. Discussed with pt IS and walking. Gave walking instructions. Discussed staying in the tube and sternal precautions. Gave low sodium diets with EF low. Encouraged heart healthy food choices. Referred to [place] CRP 2 but pt does not want to attend.  Pt stated her son is installing shower at home as she has [place]. Knows not to get in [place] now. Will take wash up at sink until shower done. Pt voiced understanding.  Graylon Good RN BSN 07/24/2019 10:18 AM

## 2019-07-24 NOTE — Progress Notes (Signed)
Occupational Therapy Treatment Patient Details Name: Lacey Coleman MRN: [id number] DOB: [birth year] Today's Date: 07/24/2019    History of present illness 85 y.o. 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 She had brief peri-operative afib at that time.  Presented with aphasia later that day and code-stroke was activated.   CT head showed well-defined stroke in the head of the left caudate nucleus.   OT comments  Pt progressing toward established OT goals. Pt required minA to powerup from EOB and minguard for functional mobility at RW level. Pt continued to require vc for adherence to sternal precautions. Pt completed UB and LB dressing prior to OT arrival, pt reports she required assistance from her daughter for completion. Pt demonstrates minor instability with functional mobility, provided pt with gait belt to maximize safety at home. Current plan for pt to d/c home with daughter today. Recommend HHOT.   Follow Up Recommendations  Home health OT;Supervision - Intermittent(with mobility)    Equipment Recommendations       Recommendations for Other Services      Precautions / Restrictions Precautions Precautions: Sternal;Fall;Other (comment) Precaution Booklet Issued: No(verbally reviewed) Restrictions Weight Bearing Restrictions: Yes Other Position/Activity Restrictions: Sternal Precautions       Mobility Bed Mobility Overal bed mobility: Needs Assistance Bed Mobility: Supine to Sit;Sit to Supine     Supine to sit: HOB elevated;Supervision     General bed mobility comments: sitting EOB upon arrival  Transfers Overall transfer level: Needs assistance Equipment used: Rolling walker (2 wheeled) Transfers: Sit to/from Stand Sit to Stand: Min assist         General transfer comment: minA to powerup from surface, provided pt with gait belt for daughter to safely assist with transfers at home    Balance Overall balance assessment: Needs  assistance Sitting-balance support: No upper extremity supported;Feet supported Sitting balance-Leahy Scale: Good     Standing balance support: During functional activity Standing balance-Leahy Scale: Fair Standing balance comment: BUE support for amb                           ADL either performed or assessed with clinical judgement   ADL Overall ADL's : Needs assistance/impaired                 Upper Body Dressing : Moderate assistance   Lower Body Dressing: Moderate assistance   Toilet Transfer: Minimal assistance;RW;Ambulation Statistician Details (indicate cue type and reason): minA to powerupf Toileting- Clothing Manipulation and Hygiene: Minimal assistance;Sit to/from stand Toileting - Clothing Manipulation Details (indicate cue type and reason): minA for powerup     Functional mobility during ADLs: Minimal assistance;Rolling walker General ADL Comments: pt reports she required assistance with dressing for adherence to sternal precautions, pt dressed prior to OT arrival     Vision       Perception     Praxis      Cognition Arousal/Alertness: Awake/alert Behavior During Therapy: Impulsive Overall Cognitive Status: No family/caregiver present to determine baseline cognitive functioning Area of Impairment: Safety/judgement                       Following Commands: Follows multi-step commands with increased time Safety/Judgement: Decreased awareness of deficits;Decreased awareness of safety   Problem Solving: Difficulty sequencing General Comments: impulsive;vc for adherence to sternal precautions        Exercises     Shoulder Instructions  General Comments vital signs within normal range during session;educated pt on activity progression and fall prevention strategies    Pertinent Vitals/ Pain       Pain Assessment: Faces Faces Pain Scale: Hurts a little bit Pain Location: chest/incision Pain Descriptors /  Indicators: Grimacing;Sore Pain Intervention(s): Limited activity within patient's tolerance;Monitored during session  Home Living                                          Prior Functioning/Environment              Frequency  Min 2X/week        Progress Toward Goals  OT Goals(current goals can now be found in the care plan section)  Progress towards OT goals: Progressing toward goals  Acute Rehab OT Goals Patient Stated Goal: home today with daughter OT Goal Formulation: With patient Time For Goal Achievement: 08/04/19 Potential to Achieve Goals: Good ADL Goals Pt Will Perform Grooming: with supervision;standing Pt Will Perform Upper Body Dressing: with set-up;sitting Pt Will Perform Lower Body Dressing: with supervision;sit to/from stand Pt Will Transfer to Toilet: with supervision;ambulating Pt Will Perform Toileting - Clothing Manipulation and hygiene: with supervision;sit to/from stand Additional ADL Goal #1: Pt will verbalize sternal precautions and maintain during ADL routine at supervision level Additional ADL Goal #2: Pt will perform bed mobility at supervision level prior to engaging ADL  Plan Discharge plan remains appropriate;Discharge plan needs to be updated    Co-evaluation                 AM-PAC OT "6 Clicks" Daily Activity     Outcome Measure   Help from another person eating meals?: A Little Help from another person taking care of personal grooming?: A Little Help from another person toileting, which includes using toliet, bedpan, or urinal?: A Lot Help from another person bathing (including washing, rinsing, drying)?: A Lot Help from another person to put on and taking off regular upper body clothing?: A Lot Help from another person to put on and taking off regular lower body clothing?: A Lot 6 Click Score: 14    End of Session Equipment Utilized During Treatment: Gait belt;Rolling walker  OT Visit Diagnosis:  Unsteadiness on feet (R26.81);Other abnormalities of gait and mobility (R26.89);Muscle weakness (generalized) (M62.81);Other symptoms and signs involving the nervous system (R29.898);Other symptoms and signs involving cognitive function   Activity Tolerance Patient tolerated treatment well   Patient Left in bed;with call bell/phone within reach   Nurse Communication Mobility status        Time: 8937-3428 OT Time Calculation (min): 8 min  Charges: OT General Charges $OT Visit: 1 Visit OT Treatments $Self Care/Home Management : 8-22 mins  Dorinda Hill OTR/L Acute Rehabilitation Services Office: [place] 07/24/2019, 11:08 AM

## 2019-07-25 LAB — URINE CULTURE
Culture: >=100000 — AB
Special Requests: NORMAL

## 2019-07-26 ENCOUNTER — Other Ambulatory Visit: Payer: Self-pay | Admitting: Cardiothoracic Surgery

## 2019-07-26 DIAGNOSIS — Z951 Presence of aortocoronary bypass graft: Secondary | ICD-10-CM

## 2019-07-26 NOTE — Progress Notes (Unsigned)
cxr 

## 2019-07-28 ENCOUNTER — Other Ambulatory Visit: Payer: Self-pay

## 2019-07-28 ENCOUNTER — Ambulatory Visit
Admission: RE | Admit: 2019-07-28 | Discharge: 2019-07-28 | Disposition: A | Discharge status: Home / Self Care | Payer: Medicare Other | Source: Ambulatory Visit | Attending: Cardiothoracic Surgery | Admitting: Cardiothoracic Surgery

## 2019-07-28 ENCOUNTER — Encounter: Payer: Self-pay | Admitting: Cardiothoracic Surgery

## 2019-07-28 ENCOUNTER — Ambulatory Visit (INDEPENDENT_AMBULATORY_CARE_PROVIDER_SITE_OTHER): Payer: Self-pay | Admitting: Cardiothoracic Surgery

## 2019-07-28 ENCOUNTER — Other Ambulatory Visit: Payer: Self-pay | Admitting: *Deleted

## 2019-07-28 VITALS — BP 131/68 | HR 84 | Temp 98.1°F | Resp 18

## 2019-07-28 DIAGNOSIS — Z951 Presence of aortocoronary bypass graft: Secondary | ICD-10-CM

## 2019-07-28 DIAGNOSIS — Z79899 Other long term (current) drug therapy: Secondary | ICD-10-CM

## 2019-07-28 MED ORDER — FUROSEMIDE 40 MG PO TABS: 40.0000 mg | ORAL_TABLET | Freq: Two times a day (BID) | ORAL | 11 refills | Status: AC

## 2019-07-28 MED ORDER — POTASSIUM CHLORIDE CRYS ER 10 MEQ PO TBCR: 10.0000 meq | EXTENDED_RELEASE_TABLET | Freq: Two times a day (BID) | ORAL | 0 refills | Status: AC

## 2019-07-28 MED ORDER — POTASSIUM CHLORIDE CRYS ER 10 MEQ PO TBCR: 20.0000 meq | EXTENDED_RELEASE_TABLET | Freq: Two times a day (BID) | ORAL | Status: DC

## 2019-07-31 NOTE — Progress Notes (Signed)
[address]       [place] [address]             [phone]     CARDIOTHORACIC SURGERY OFFICE NOTE  Referring Provider is Wellington Hampshire, MD Primary Cardiologist is No primary care provider on file. PCP is Patient, No Pcp Per   HPI:  85 yo lady returns to clinic 9 days after CABG x 2 for severe LM disease and CHF. She is accompanied by 2 daughters. Has had some trouble with pedal edema but otherwise doing well. No overt SOB, PND or orthopnea. Denies palpitations.    Current Outpatient Medications  Medication Sig Dispense Refill  . acetaminophen (TYLENOL) 500 MG tablet May take 1 tablet (500 mg total) by mouth every 6 (six) hours as needed for mild pain. May also take 1 tablet (500 mg total) every 6 (six) hours as needed for mild pain. 30 tablet 0  . amiodarone (PACERONE) 200 MG tablet Take 1 tablet (200 mg total) by mouth 2 (two) times daily. 60 tablet 1  . apixaban (ELIQUIS) 5 MG TABS tablet Take 1 tablet (5 mg total) by mouth 2 (two) times daily. 60 tablet 1  . aspirin EC 81 MG EC tablet Take 1 tablet (81 mg total) by mouth daily.    Marland Kitchen atorvastatin (LIPITOR) 40 MG tablet Take 1 tablet (40 mg total) by mouth daily at 6 PM. 30 tablet 0  . furosemide (LASIX) 40 MG tablet Take 1 tablet (40 mg total) by mouth daily. 10 tablet 0  . metoprolol tartrate (LOPRESSOR) 25 MG tablet Take 0.5 tablets (12.5 mg total) by mouth 2 (two) times daily. 60 tablet 1  . furosemide (LASIX) 40 MG tablet Take 1 tablet (40 mg total) by mouth 2 (two) times daily. 60 tablet 11  . potassium chloride (K-DUR) 10 MEQ tablet Take 1 tablet (10 mEq total) by mouth 2 (two) times daily. 60 tablet 0   No current facility-administered medications for this visit.       Physical Exam:   BP 131/68 (BP Location: Left Arm, Patient Position: Sitting, Cuff Size: Small)   Pulse 84   Temp 98.1 F (36.7 C)   Resp 18   SpO2 96% Comment: RA  General:  Well-appearing, NAD  Chest:   Bibasilar  rales  CV:   rrr  Incisions:  C/di; staples removed  Abdomen:  sntnd  Extremities:  1+ pretibial edema  Diagnostic Tests:  CXR with mild pulmonary edema   Impression:  Doing well after CABG since discharge; no new neurological events; BP does need better control given residual AI Plan:  f/u in 3 weeks Encourage po intake Lasix for a few days given peripheral edema Seeing Dr. Haroldine Laws next week  I spent in excess of 15 minutes during the conduct of this office consultation and >50% of this time involved direct face-to-face encounter with the patient for counseling and/or coordination of their care.  Level 2                 10 minutes Level 3                 15 minutes Level 4                 25 minutes Level 5                 40 minutes  B. Murvin Natal, MD 07/31/2019 7:51 AM

## 2019-08-03 ENCOUNTER — Encounter (HOSPITAL_COMMUNITY): Payer: Self-pay | Admitting: Internal Medicine

## 2019-08-03 ENCOUNTER — Ambulatory Visit (HOSPITAL_COMMUNITY)
Admission: RE | Admit: 2019-08-03 | Discharge: 2019-08-03 | Disposition: A | Discharge status: Home / Self Care | Payer: Medicare Other | Source: Ambulatory Visit | Attending: Internal Medicine | Admitting: Internal Medicine

## 2019-08-03 ENCOUNTER — Other Ambulatory Visit: Payer: Self-pay

## 2019-08-03 VITALS — BP 121/70 | HR 82 | Wt 137.2 lb

## 2019-08-03 DIAGNOSIS — I5021 Acute systolic (congestive) heart failure: Secondary | ICD-10-CM | POA: Diagnosis present

## 2019-08-03 DIAGNOSIS — I2510 Atherosclerotic heart disease of native coronary artery without angina pectoris: Secondary | ICD-10-CM

## 2019-08-03 DIAGNOSIS — R600 Localized edema: Secondary | ICD-10-CM | POA: Diagnosis not present

## 2019-08-03 DIAGNOSIS — I480 Paroxysmal atrial fibrillation: Secondary | ICD-10-CM | POA: Diagnosis not present

## 2019-08-03 DIAGNOSIS — Z79899 Other long term (current) drug therapy: Secondary | ICD-10-CM | POA: Insufficient documentation

## 2019-08-03 DIAGNOSIS — I5022 Chronic systolic (congestive) heart failure: Secondary | ICD-10-CM

## 2019-08-03 DIAGNOSIS — R4701 Aphasia: Secondary | ICD-10-CM | POA: Insufficient documentation

## 2019-08-03 DIAGNOSIS — Z7982 Long term (current) use of aspirin: Secondary | ICD-10-CM | POA: Diagnosis not present

## 2019-08-03 DIAGNOSIS — Z951 Presence of aortocoronary bypass graft: Secondary | ICD-10-CM | POA: Diagnosis not present

## 2019-08-03 DIAGNOSIS — M069 Rheumatoid arthritis, unspecified: Secondary | ICD-10-CM | POA: Insufficient documentation

## 2019-08-03 DIAGNOSIS — Z7901 Long term (current) use of anticoagulants: Secondary | ICD-10-CM | POA: Insufficient documentation

## 2019-08-03 DIAGNOSIS — I110 Hypertensive heart disease with heart failure: Secondary | ICD-10-CM | POA: Diagnosis not present

## 2019-08-03 LAB — CBC
HCT: 32.4 % — ABNORMAL LOW (ref 36.0–46.0)
Hemoglobin: 10 g/dL — ABNORMAL LOW (ref 12.0–15.0)
MCH: 24.2 pg — ABNORMAL LOW (ref 26.0–34.0)
MCHC: 30.9 g/dL (ref 30.0–36.0)
MCV: 78.3 fL — ABNORMAL LOW (ref 80.0–100.0)
Platelets: 386 10*3/uL (ref 150–400)
RBC: 4.14 MIL/uL (ref 3.87–5.11)
RDW: 18 % — ABNORMAL HIGH (ref 11.5–15.5)
WBC: 9.9 10*3/uL (ref 4.0–10.5)
nRBC: 0 % (ref 0.0–0.2)

## 2019-08-03 LAB — BASIC METABOLIC PANEL
Anion gap: 12 (ref 5–15)
BUN: 15 mg/dL (ref 8–23)
CO2: 23 mmol/L (ref 22–32)
Calcium: 9.7 mg/dL (ref 8.9–10.3)
Chloride: 99 mmol/L (ref 98–111)
Creatinine, Ser: 0.64 mg/dL (ref 0.44–1.00)
GFR calc Af Amer: >60 mL/min (ref >60)
GFR calc non Af Amer: >60 mL/min (ref >60)
Glucose, Bld: 117 mg/dL — ABNORMAL HIGH (ref 70–99)
Potassium: 4.6 mmol/L (ref 3.5–5.1)
Sodium: 134 mmol/L — ABNORMAL LOW (ref 135–145)

## 2019-08-03 MED ORDER — SPIRONOLACTONE 25 MG PO TABS: 25.0000 mg | ORAL_TABLET | Freq: Every day | ORAL | 6 refills | Status: AC

## 2019-08-03 MED ORDER — AMIODARONE HCL 200 MG PO TABS: 200.0000 mg | ORAL_TABLET | Freq: Every day | ORAL | 6 refills | Status: AC

## 2019-08-03 NOTE — Patient Instructions (Addendum)
DECREASE Amiodarone to 200mg  (1 tab) DAILY  START Spironolactone 12/5mg  (1/2 tab) DAILY  You have been referred to [facility] in [facility].  You will get a call to schedule this appointment.   You were given a prescription for [facility] for your lower legs.  Take this prescription to [facility].  Their address is [address], [place].  Labs today We will only contact you if something comes back abnormal or we need to make some changes. Otherwise no news is good news!  Your physician recommends that you schedule a follow-up appointment in: 2 month with Dr Haroldine Laws  At the [facility], you and your health needs are our priority. As part of our continuing mission to provide you with exceptional heart care, we have created designated Provider Care Teams. These Care Teams include your primary Cardiologist (physician) and Advanced Practice Providers (APPs- Physician Assistants and Nurse Practitioners) who all work together to provide you with the care you need, when you need it.   You may see any of the following providers on your designated Care Team at your next follow up: Marland Kitchen Dr Glori Bickers . Dr Loralie Champagne . Darrick Grinder, NP   Please be sure to bring in all your medications bottles to every appointment.

## 2019-08-03 NOTE — Progress Notes (Addendum)
ADVANCED HF CLINIC NOTE  PCP: Primary Cardiologist:  HPI:  Ms. Limb is an 85 yo woman with h/o HTN who was recently admitted to [facility] and found to have severe 3v CAD with high-grade LM disease and EF 25-30% (improved to 35-40% on intra-op TEE)  Underwent CABG x 2 on 07/19/19 (LIMA to LAD, SVG - OM, RCA occluded and nongraftable)  for severe LM disease and CHF. Post CABG course c/b PAF and CVA with aphasia.  She is accompanied by 2 daughters. Doing well. Aphasia has resolved. Saw Dr. Vickey Sages earlier in the week and lasix increase due to pedal edema. SBP 130-140  Weight initially 145 -> 137.5  Eating well. No CP or SPB. R shoulder has been sore.    ROS: All systems negative except as listed in HPI, PMH and Problem List.  SH:  Social History   Socioeconomic History  . Marital status: Single    Spouse name: Not on file  . Number of children: Not on file  . Years of education: Not on file  . Highest education level: Not on file  Occupational History  . Not on file  Social Needs  . Financial resource strain: Not on file  . Food insecurity    Worry: Not on file    Inability: Not on file  . Transportation needs    Medical: Not on file    Non-medical: Not on file  Tobacco Use  . Smoking status: Never Smoker  . Smokeless tobacco: Never Used  Substance and Sexual Activity  . Alcohol use: Not Currently    Frequency: Never  . Drug use: Not Currently  . Sexual activity: Not on file  Lifestyle  . Physical activity    Days per week: Not on file    Minutes per session: Not on file  . Stress: Not on file  Relationships  . Social Musician on phone: Not on file    Gets together: Not on file    Attends religious service: Not on file    Active member of club or organization: Not on file    Attends meetings of clubs or organizations: Not on file    Relationship status: Not on file  . Intimate partner violence    Fear of current or ex partner: Not on file   Emotionally abused: Not on file    Physically abused: Not on file    Forced sexual activity: Not on file  Other Topics Concern  . Not on file  Social History Narrative  . Not on file    FH: No family history on file.  Past Medical History:  Diagnosis Date  . CAD (coronary artery disease)    9/14 LHC with EF 25-35%, mod elevated LVEDP, dLM 90%, pLCx80%, pLAD to mLAD 90%, 1st diag 80%, mid Cx 100%, pRCA 90%, pRCA to mRCA 100%  . Heart failure with reduced ejection fraction (HCC)    a) 9/13 echo with EF 30-35%, mild LVF, diffuse hypokinesis, mod aortic calcification, mild AR  . Hypertension   . Rheumatoid arthritis (HCC)     Current Outpatient Medications  Medication Sig Dispense Refill  . acetaminophen (TYLENOL) 500 MG tablet May take 1 tablet (500 mg total) by mouth every 6 (six) hours as needed for mild pain. May also take 1 tablet (500 mg total) every 6 (six) hours as needed for mild pain. 30 tablet 0  . acidophilus (RISAQUAD) CAPS capsule Take 1 capsule by mouth daily.    Marland Kitchen  amiodarone (PACERONE) 200 MG tablet Take 1 tablet (200 mg total) by mouth 2 (two) times daily. 60 tablet 1  . apixaban (ELIQUIS) 5 MG TABS tablet Take 1 tablet (5 mg total) by mouth 2 (two) times daily. 60 tablet 1  . aspirin EC 81 MG EC tablet Take 1 tablet (81 mg total) by mouth daily.    Marland Kitchen atorvastatin (LIPITOR) 40 MG tablet Take 1 tablet (40 mg total) by mouth daily at 6 PM. 30 tablet 0  . Coenzyme Q10-Vitamin E (QUNOL ULTRA COQ10 PO) Take by mouth.    . furosemide (LASIX) 40 MG tablet Take 1 tablet (40 mg total) by mouth daily. 10 tablet 0  . furosemide (LASIX) 40 MG tablet Take 1 tablet (40 mg total) by mouth 2 (two) times daily. 60 tablet 11  . L-Lysine 500 MG CAPS Take by mouth.    . metoprolol tartrate (LOPRESSOR) 25 MG tablet Take 0.5 tablets (12.5 mg total) by mouth 2 (two) times daily. 60 tablet 1  . potassium chloride (K-DUR) 10 MEQ tablet Take 1 tablet (10 mEq total) by mouth 2 (two) times daily.  60 tablet 0   No current facility-administered medications for this encounter.     Vitals:   08/03/19 0932  BP: 121/70  Pulse: 82  SpO2: 96%  Weight: 62.2 kg (137 lb 3.2 oz)    PHYSICAL EXAM:  General:  Elderly No resp difficulty HEENT: normal Neck: supple. JVP 6-7Carotids 2+ bilaterally; no bruits. No lymphadenopathy or thryomegaly appreciated. Cor: Sternal wound ok PMI normal. Regular rate & rhythm. No rubs, gallops or murmurs. Lungs: clear decreased L base  Abdomen: soft, nontender, nondistended. No hepatosplenomegaly. No bruits or masses. Good bowel sounds. Extremities: no cyanosis, clubbing, rash,  2+ edema  R>L Neuro: alert & orientedx3, cranial nerves grossly intact. Moves all 4 extremities w/o difficulty. Affect pleasant.   ASSESSMENT & PLAN:  1) CAD s/p CABG x 2  07/19/19 (LIMA to LAD, SVG - OM, RCA occluded and nongraftable) - doing well  - volume up but improving.  - Continue lasix 20 bid (can drop as needed) - Place compression hose - Refer to CR - Continue ASA/statin/b-blocker  2. Acute systolic HF - echo initially 25-30% - improved to 35-40 on intra-op TEE - Volume mildly up. Add spiro - consider Entresto at next visit if EF remains down.   3) PAF - maintaining NSR  - drop amio to 200 daily - continue apixaban  4) HTN - still a bit high - add spiro 12.5 - BMET 1 week   5) LE edema - improving but not resolved.  - weight down. Suspect component of venous insufficiency. Be carefull not to overdiurese - place compression hose - add spiro 12.5   6) CVA with expressive aphasia - resolved - continue statin/Apixaban  Glori Bickers, MD  2:47 PM

## 2019-08-04 NOTE — Progress Notes (Deleted)
Cardiology Office Note    Date:  08/04/2019   ID:  ADASIA HOAR, DOB January 14, 1931, MRN [id number]  PCP:  Patient, No Pcp Per  Cardiologist:  Kathlyn Sacramento, MD  Electrophysiologist:  None   Chief Complaint: Hospital follow up  History of Present Illness:   ANDREAH GOHEEN is a 85 y.o. female with history of recently diagnosed multivessel CAD status post two-vessel CABG as detailed below, HFrEF secondary to ICM, postoperative A. fib, postoperative CVA with expressive aphasia, hypertension, and rheumatoid arthritis who presents for hospital follow-up as detailed below.  Patient was recently admitted to [facility] with a non-STEMI and found to have three-vessel CAD with high-grade left main disease and an EF of 25 to 30%.  In this setting she was transferred to [facility] and underwent two-vessel CABG on 07/19/2019 with a LIMA to LAD and SVG to OM.  The RCA was occluded and nongraftable.  Postoperative course was complicated by postoperative A. fib and CVA with a aphasia.  Intraoperative TEE showed an improved LV systolic function with an EF of 35 to 40%.  She was evaluated by PCP in late 07/2019 with subsequent increase in Lasix secondary to pedal edema.  Her aphasia had resolved by the time she was evaluated by the advanced heart failure clinic.  Weight trend was documented at 145 pounds to 137.5 pounds.  It was recommended she continue Lasix 20 mg twice daily and Spironolactone was added.  Amiodarone was discontinued.  Given her post operative CVA she was continued on apixaban.  ***   Labs: 08/2019 - sodium 134, potassium 4.6, serum creatinine 0.64, Hgb 10.0, PLT 386 07/2019 - total cholesterol 93, triglyceride 81, HDL 33, LDL 44, magnesium 2.2, A1c 5.5, albumin 3.3, AST/ALT normal  Past Medical History:  Diagnosis Date  . CAD (coronary artery disease)    9/14 LHC with EF 25-35%, mod elevated LVEDP, dLM 90%, pLCx80%, pLAD to mLAD 90%, 1st diag 80%, mid Cx 100%, pRCA 90%, pRCA to mRCA 100%  . Heart  failure with reduced ejection fraction ([place])    a) 9/13 echo with EF 30-35%, mild LVF, diffuse hypokinesis, mod aortic calcification, mild AR  . Hypertension   . Rheumatoid arthritis [facility])     Past Surgical History:  Procedure Laterality Date  . CORONARY ARTERY BYPASS GRAFT N/A 07/19/2019   Procedure: CORONARY ARTERY BYPASS GRAFTING (CABG) x 2; Using Left Internal Mammary Artery (LIMA) and Right Leg Greater Saphenous Vein (SVG) harvested endoscopically; LIMA to LAD, SVG to Circ.;  Surgeon: Wonda Olds, MD;  Location: [place];  Service: Open Heart Surgery;  Laterality: N/A;  . RIGHT/LEFT HEART CATH AND CORONARY ANGIOGRAPHY N/A 07/17/2019   Procedure: RIGHT/LEFT HEART CATH AND CORONARY ANGIOGRAPHY;  Surgeon: Wellington Hampshire, MD;  Location: [place] CV LAB;  Service: Cardiovascular;  Laterality: N/A;  . TEE WITHOUT CARDIOVERSION N/A 07/19/2019   Procedure: TRANSESOPHAGEAL ECHOCARDIOGRAM (TEE);  Surgeon: Wonda Olds, MD;  Location: [place];  Service: Open Heart Surgery;  Laterality: N/A;  . TONSILLECTOMY      Current Medications: No outpatient medications have been marked as taking for the 08/07/19 encounter (Appointment) with Rise Mu, PA-C.    Allergies:   Patient has no known allergies.   Social History   Socioeconomic History  . Marital status: Single    Spouse name: Not on file  . Number of children: Not on file  . Years of education: Not on file  . Highest education level: Not on  file  Occupational History  . Not on file  Social Needs  . Financial resource strain: Not on file  . Food insecurity    Worry: Not on file    Inability: Not on file  . Transportation needs    Medical: Not on file    Non-medical: Not on file  Tobacco Use  . Smoking status: Never Smoker  . Smokeless tobacco: Never Used  Substance and Sexual Activity  . Alcohol use: Not Currently    Frequency: Never  . Drug use: Not Currently  . Sexual activity: Not on file  Lifestyle  .  Physical activity    Days per week: Not on file    Minutes per session: Not on file  . Stress: Not on file  Relationships  . Social Musician on phone: Not on file    Gets together: Not on file    Attends religious service: Not on file    Active member of club or organization: Not on file    Attends meetings of clubs or organizations: Not on file    Relationship status: Not on file  Other Topics Concern  . Not on file  Social History Narrative  . Not on file     Family History:  The patient's family history is not on file.  ROS:   ROS   EKGs/Labs/Other Studies Reviewed:    Studies reviewed were summarized above. The additional studies were reviewed today:  2D Echo 07/16/2019:  1. The left ventricle has moderate-severely reduced systolic function, with an ejection fraction of 30-35%. The cavity size was normal. There is mildly increased left ventricular wall thickness. Left ventricular diastolic Doppler parameters are  consistent with impaired relaxation. Left ventricular diffuse hypokinesis.Unable to exclude regional wall motion abnormality.  2. The right ventricle has normal systolic function. The cavity was normal. There is no increase in right ventricular wall thickness.Unable to estimate RVSP.  3. Moderate calcification of the aortic valve. Aortic valve regurgitation is mild __________   [facility] 07/17/2019: The left ventricular ejection fraction is 25-35% by visual estimate.  There is moderate left ventricular systolic dysfunction.  LV end diastolic pressure is moderately elevated.  Dist LM lesion is 90% stenosed.  Prox Cx lesion is 80% stenosed.  Prox LAD to Mid LAD lesion is 90% stenosed.  1st Diag lesion is 80% stenosed.  Mid Cx lesion is 100% stenosed.  Prox RCA lesion is 90% stenosed.  Prox RCA to Mid RCA lesion is 100% stenosed.   1.  Severe three-vessel and left main coronary artery disease.  The coronary arteries are overall moderately to  severely calcified. 2.  Moderately to severely reduced LV systolic function with an EF of 30 to 35%.  Left ventricular angiogram was not optimal due to PVCs and poor filling the an echo was done before the cath. 3.  Right heart catheterization showed moderately elevated filling pressures with mean wedge pressure of 25 mmHg, moderate pulmonary hypertension at 50/24 mmHg and moderately severely reduced cardiac output at 3.41 with a cardiac index of 1.95.  Recommendations: The patient was given 1 dose of IV furosemide during catheterization given some difficulty breathing and right heart cath findings.  Continue gentle diuresis and treatment for heart failure and coronary artery disease. Resume heparin drip 8 hours after sheath pull. Transfer to [facility] for CABG evaluation. __________  2D Echo 07/21/2019: 1. Left ventricular ejection fraction, by visual estimation, is 25-30% with diffuse hypokinesis more pronounced in the  apical portions. The left ventricle has normal function. Normal left ventricular size. There is no left ventricular hypertrophy.  2. Global right ventricle has normal systolic function.The right ventricular size is normal. No increase in right ventricular wall thickness.  3. Left atrial size was normal.  4. Right atrial size was normal.  5. The mitral valve is normal in structure. Mild mitral valve regurgitation. No evidence of mitral stenosis.  6. The tricuspid valve is normal in structure. Tricuspid valve regurgitation was not visualized by color flow Doppler.  7. The aortic valve is tricuspid with severely thickened and calcified leaflets. Aortic valve regurgitation is mild by color flow Doppler. Moderate aortic valve stenosis.  8. The pulmonic valve was normal in structure. Pulmonic valve regurgitation is not visualized by color flow Doppler.  9. The inferior vena cava is normal in size with greater than 50% respiratory variability, suggesting right atrial pressure  of 3 mmHg. 10. Aortic stenosis is possibly severe in the settings of low flow low gradient aortic stenosis. 11. Definity contrast agent was given IV to delineate the left ventricular endocardial borders. 12. There is no evidence for an intracardiac thrombus on the Definity echocontrast images.   EKG:  EKG is ordered today.  The EKG ordered today demonstrates ***  Recent Labs: 07/16/2019: ALT 24; B Natriuretic Peptide 2,027.0 07/20/2019: Magnesium 2.2 08/03/2019: BUN 15; Creatinine, Ser 0.64; Hemoglobin 10.0; Platelets 386; Potassium 4.6; Sodium 134  Recent Lipid Panel    Component Value Date/Time   CHOL 93 07/21/2019 0444   TRIG 81 07/21/2019 0444   HDL 33 (L) 07/21/2019 0444   CHOLHDL 2.8 07/21/2019 0444   VLDL 16 07/21/2019 0444   LDLCALC 44 07/21/2019 0444    PHYSICAL EXAM:    VS:  There were no vitals taken for this visit.  BMI: There is no height or weight on file to calculate BMI.  Physical Exam  Wt Readings from Last 3 Encounters:  08/03/19 137 lb 3.2 oz (62.2 kg)  07/24/19 143 lb (64.9 kg)  07/17/19 150 lb 2.1 oz (68.1 kg)     ASSESSMENT & PLAN:   1. CAD status post recent CABG without angina:  2. HFrEF secondary to ICM:  3. Postoperative A. Fib:  4. Postoperative CVA with expressive aphasia:  5. HTN:  6. Lower extremity edema:  Disposition: F/u with Dr. Kirke CorinArida or an APP in ***.   Medication Adjustments/Labs and Tests Ordered: Current medicines are reviewed at length with the patient today.  Concerns regarding medicines are outlined above. Medication changes, Labs and Tests ordered today are summarized above and listed in the Patient Instructions accessible in Encounters.   Signed, Eula Listenyan Traye Bates, PA-C 08/04/2019 10:37 AM     [facility] -  [address] [place], [place] [address] [phone]

## 2019-08-07 ENCOUNTER — Emergency Department: Payer: Medicare Other

## 2019-08-07 ENCOUNTER — Other Ambulatory Visit: Payer: Self-pay

## 2019-08-07 ENCOUNTER — Ambulatory Visit: Payer: Medicare Other | Admitting: Physician Assistant

## 2019-08-07 ENCOUNTER — Encounter: Payer: Self-pay | Admitting: Emergency Medicine

## 2019-08-07 ENCOUNTER — Emergency Department
Admission: EM | Admit: 2019-08-07 | Discharge: 2019-08-07 | Disposition: A | Discharge status: Home / Self Care | Payer: Medicare Other | Attending: Emergency Medicine | Admitting: Emergency Medicine

## 2019-08-07 DIAGNOSIS — I5021 Acute systolic (congestive) heart failure: Secondary | ICD-10-CM | POA: Insufficient documentation

## 2019-08-07 DIAGNOSIS — Z7982 Long term (current) use of aspirin: Secondary | ICD-10-CM | POA: Insufficient documentation

## 2019-08-07 DIAGNOSIS — S022XXA Fracture of nasal bones, initial encounter for closed fracture: Secondary | ICD-10-CM | POA: Insufficient documentation

## 2019-08-07 DIAGNOSIS — Y9301 Activity, walking, marching and hiking: Secondary | ICD-10-CM | POA: Insufficient documentation

## 2019-08-07 DIAGNOSIS — M25511 Pain in right shoulder: Secondary | ICD-10-CM | POA: Diagnosis not present

## 2019-08-07 DIAGNOSIS — W010XXA Fall on same level from slipping, tripping and stumbling without subsequent striking against object, initial encounter: Secondary | ICD-10-CM | POA: Insufficient documentation

## 2019-08-07 DIAGNOSIS — I2510 Atherosclerotic heart disease of native coronary artery without angina pectoris: Secondary | ICD-10-CM | POA: Diagnosis not present

## 2019-08-07 DIAGNOSIS — Z7901 Long term (current) use of anticoagulants: Secondary | ICD-10-CM | POA: Insufficient documentation

## 2019-08-07 DIAGNOSIS — Z79899 Other long term (current) drug therapy: Secondary | ICD-10-CM | POA: Diagnosis not present

## 2019-08-07 DIAGNOSIS — S0083XA Contusion of other part of head, initial encounter: Secondary | ICD-10-CM | POA: Insufficient documentation

## 2019-08-07 DIAGNOSIS — I252 Old myocardial infarction: Secondary | ICD-10-CM | POA: Diagnosis not present

## 2019-08-07 DIAGNOSIS — S0992XA Unspecified injury of nose, initial encounter: Secondary | ICD-10-CM | POA: Diagnosis present

## 2019-08-07 DIAGNOSIS — I110 Hypertensive heart disease with heart failure: Secondary | ICD-10-CM | POA: Insufficient documentation

## 2019-08-07 DIAGNOSIS — Y999 Unspecified external cause status: Secondary | ICD-10-CM | POA: Insufficient documentation

## 2019-08-07 DIAGNOSIS — Y929 Unspecified place or not applicable: Secondary | ICD-10-CM | POA: Diagnosis not present

## 2019-08-07 DIAGNOSIS — W19XXXA Unspecified fall, initial encounter: Secondary | ICD-10-CM

## 2019-08-07 DIAGNOSIS — Z951 Presence of aortocoronary bypass graft: Secondary | ICD-10-CM | POA: Diagnosis not present

## 2019-08-07 MED ORDER — TRAMADOL HCL 50 MG PO TABS: ORAL_TABLET | ORAL | 0 refills | Status: AC

## 2019-08-07 NOTE — ED Provider Notes (Signed)
[facility] Emergency Department Provider Note  ____________________________________________   First MD Initiated Contact with Patient 08/07/19 1028     (approximate)  I have reviewed the triage vital signs and the nursing notes.   HISTORY  Chief Complaint Fall   HPI Lacey Coleman is a 85 y.o. female presents to the ED after she [place] medical mall entrance.  Patient states that she tripped over the bottom of the poles that were out in the medical mall to check and to answer the COVID questions.  She landed face first and states initially she had a small nosebleed which is now resolved.  She denies any loss of consciousness but currently is taking blood thinners.  Patient is status post CABG on 07/19/2019.  Patient states that she was going to see her cardiologist for follow-up.  She complains of nasal pain and forehead pain.  She also states that her right shoulder is hurting but was hurting prior to her fall.  Daughter is also with patient and agrees that there was no LOC and that patient had a mechanical fall rather than a syncopal episode.  Currently patient denies any chest pain unrelated to her recent surgery or shortness of breath.      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   3-vessel CAD 07/17/2019   Rheumatoid arthritis (HCC) 07/17/2019   HTN (hypertension) 07/17/2019   Acute heart failure (HCC)    Non-ST elevation (NSTEMI) myocardial infarction [facility](HCC)    Acute systolic heart failure (HCC)  16/10/960409/13/2020   Acute respiratory failure (HCC) 07/16/2019    Past Surgical History:  Procedure Laterality Date   CORONARY ARTERY BYPASS GRAFT N/A 07/19/2019   Procedure: CORONARY ARTERY BYPASS GRAFTING (CABG) x 2; Using Left Internal Mammary Artery (LIMA) and Right Leg Greater Saphenous Vein (SVG) harvested endoscopically; LIMA to LAD, SVG to Circ.;  Surgeon: Linden DolinAtkins, Broadus Z, MD;  Location: [facility] OR;  Service: Open Heart Surgery;  Laterality: N/A;   RIGHT/LEFT HEART CATH AND CORONARY ANGIOGRAPHY N/A 07/17/2019   Procedure: RIGHT/LEFT HEART CATH AND CORONARY ANGIOGRAPHY;  Surgeon: Iran OuchArida, Muhammad A, MD;  Location: [facility] INVASIVE CV LAB;  Service: Cardiovascular;  Laterality: N/A;   TEE WITHOUT CARDIOVERSION N/A 07/19/2019   Procedure: TRANSESOPHAGEAL ECHOCARDIOGRAM (TEE);  Surgeon: Linden DolinAtkins, Broadus Z, MD;  [facility];  Service: Open Heart Surgery;  Laterality: N/A;   TONSILLECTOMY      Prior to Admission medications   Medication Sig Start Date End Date Taking? Authorizing Provider  acetaminophen (TYLENOL) 500 MG tablet May take 1 tablet (500 mg total) by mouth every 6 (six) hours as needed for mild pain. May also take 1 tablet (500 mg total) every 6 (six) hours as needed for mild pain. 07/24/19   Gold, Wayne E, PA-C  acidophilus (RISAQUAD) CAPS capsule Take 1 capsule by mouth daily.    [provider]  amiodarone (PACERONE) 200 MG tablet Take 1 tablet (200 mg total) by  mouth daily. 08/03/19   Bensimhon, Bevelyn Buckles, MD  apixaban (ELIQUIS) 5 MG TABS tablet Take 1 tablet (5 mg total) by mouth 2 (two) times daily. 07/24/19   Gold, Glenice Laine, PA-C  aspirin EC 81 MG EC tablet Take 1 tablet (81 mg total) by mouth daily. 07/24/19   Gold, Wayne E, PA-C  atorvastatin (LIPITOR) 40 MG tablet Take 1 tablet (40 mg total) by mouth daily at 6 PM. 07/17/19   Katha Hamming, MD  Coenzyme Q10-Vitamin E (QUNOL ULTRA COQ10 PO) Take by mouth.    [provider]  furosemide (LASIX) 40 MG tablet Take  1 tablet (40 mg total) by mouth daily. 07/24/19   Gold, Wayne E, PA-C  furosemide (LASIX) 40 MG tablet Take 1 tablet (40 mg total) by mouth 2 (two) times daily. 07/28/19 07/27/20  Linden Dolin, MD  L-Lysine 500 MG CAPS Take by mouth.    [provider]  metoprolol tartrate (LOPRESSOR) 25 MG tablet Take 0.5 tablets (12.5 mg total) by mouth 2 (two) times daily. 07/24/19   Gold, Deniece Portela E, PA-C  potassium chloride (K-DUR) 10 MEQ tablet Take 1 tablet (10 mEq total) by mouth 2 (two) times daily. 07/28/19   Linden Dolin, MD  spironolactone (ALDACTONE) 25 MG tablet Take 1 tablet (25 mg total) by mouth daily. 08/03/19   Bensimhon, Bevelyn Buckles, MD  traMADol Janean Sark) 50 MG tablet 1/2 - 1 tablet q 6-8 hours prn pain 08/07/19   Tommi Rumps, PA-C    Allergies Patient has no known allergies.  No family history on file.  Social History Social History   Tobacco Use   Smoking status: Never Smoker   Smokeless tobacco: Never Used  Substance Use Topics   Alcohol use: Not Currently    Frequency: Never   Drug use: Not Currently    Review of Systems Constitutional: No fever/chills Eyes: No visual changes. ENT: Positive nasal injury, positive resolved nosebleed. Cardiovascular: Denies chest pain. Respiratory: Denies shortness of breath. Gastrointestinal: No abdominal pain.  No nausea, no vomiting.  Musculoskeletal: Positive right shoulder pain. Skin: Positive superficial abrasion to the nose and forehead. Neurological: Negative for headaches, focal weakness or numbness. ____________________________________________   PHYSICAL EXAM:  VITAL SIGNS: ED Triage Vitals  Enc Vitals Group     BP      Pulse      Resp      Temp      Temp src      SpO2      Weight      Height      Head Circumference      Peak Flow      Pain Score      Pain Loc      Pain Edu?      Excl. in GC?    Constitutional: Alert and oriented. Well appearing and in no acute distress.  Patient is answering  questions appropriately and describes her fall accurately as witnessed by her daughter. Eyes: Conjunctivae are normal. PERRL. EOMI. Head: Atraumatic. Nose: Soft tissue swelling nasal bone with superficial abrasion.  No active bleeding is from the nares or the abrasion. Neck: No stridor.  No point tenderness on palpation of cervical spine posteriorly. Cardiovascular: Normal rate, regular rhythm. Grossly normal heart sounds.  Good peripheral circulation. Respiratory: Normal respiratory effort.  No retractions. Lungs CTAB.  Anterior chest wall has healing sutured area without any evidence of infection. Gastrointestinal: Soft and nontender. No distention.  Musculoskeletal: On exam right shoulder  there is diffuse tenderness on palpation however there is no crepitus or soft tissue injury noted.  No point tenderness is noted on palpation of thoracic or lumbar spine.  There is tenderness on palpation of the facial bones and including the forehead with some soft tissue edema.  Patient is able to move lower extremities without any difficulty and is nontender to palpation bilateral knees. Neurologic:  Normal speech and language. No gross focal neurologic deficits are appreciated. Skin:  Skin is warm, dry.  Abrasion as noted above. Psychiatric: Mood and affect are normal. Speech and behavior are normal.  ____________________________________________   LABS (all labs ordered are listed, but only abnormal results are displayed)  Labs Reviewed - No data to display _ RADIOLOGY  Official radiology report(s): Dg Shoulder Right  Result Date: 08/07/2019 CLINICAL DATA:  Right shoulder pain since a fall down stairs today. EXAM: RIGHT SHOULDER - 2+ VIEW COMPARISON:  None. FINDINGS: There is no fracture or dislocation. No significant arthritic changes. Diffuse osteopenia. Chronic blunting of the right costophrenic angle. CABG. IMPRESSION: No acute abnormality. Osteopenia. Chronic blunting of the right costophrenic  angle. Electronically Signed   By: Francene BoyersJames  Maxwell M.D.   On: 08/07/2019 11:24   Ct Head Wo Contrast  Result Date: 08/07/2019 CLINICAL DATA:  Tripped and fell.  Landed on face and head. EXAM: CT HEAD WITHOUT CONTRAST CT MAXILLOFACIAL WITHOUT CONTRAST CT CERVICAL SPINE WITHOUT CONTRAST TECHNIQUE: Multidetector CT imaging of the head, cervical spine, and maxillofacial structures were performed using the standard protocol without intravenous contrast. Multiplanar CT image reconstructions of the cervical spine and maxillofacial structures were also generated. COMPARISON:  Head CT 08/07/2019, CTA neck 07/20/2019 FINDINGS: CT HEAD FINDINGS Brain: No acute intracranial hemorrhage. No focal mass lesion. No CT evidence of acute infarction. No midline shift or mass effect. No hydrocephalus. Basilar cisterns are patent. There are periventricular and subcortical white matter hypodensities. Generalized cortical atrophy. Small remote lacunar infarctions in the head of the LEFT caudate nucleus and the RIGHT lentiform nucleus. Vascular: No hyperdense vessel or unexpected calcification. Skull: Normal. Negative for fracture or focal lesion. Sinuses/Orbits: Paranasal sinuses and mastoid air cells are clear. Orbits are clear. Other: Small scalp hematoma over the midline frontal bone. CT MAXILLOFACIAL FINDINGS Osseous: Orbital rims are intact. No fracture of the maxillary sinus walls. Pterygoid plates are normal. Mildly depressed fracture of the LEFT nasal bone (image 30/2). Mild flattening of the normal contour. Mandibular condyles are located.  No mandibular fracture contour. Orbits: Globes are intact. No proptosis. Intraconal contents are normal. Sinuses: Clear Soft tissues: Unremarkable CT CERVICAL SPINE FINDINGS Motion degradation most noted on the sagittal imaging. Alignment: Normal alignment of the cervical vertebral bodies. Skull base and vertebrae: Normal craniocervical junction. No loss of vertebral body height or disc  height. Normal facet articulation. No evidence of fracture. Soft tissues and spinal canal: No prevertebral soft tissue swelling. No perispinal or epidural hematoma. Disc levels: Multiple levels of endplate spurring mL joint space narrowing. No acute findings Upper chest: Bilateral layering pleural effusions. Other: Large LEFT thyroid goiter measuring up to 4.5 cm not changed from comparison CTA. IMPRESSION: 1. No acute intracranial findings. Small scalp hematoma over the midline frontal bone. 2. Atrophy and white matter microvascular disease. Remote basal ganglia lacunar infarctions. 3. Mildly depressed LEFT NASAL BONE FRACTURE. 4. No additional facial bone fracture. 5. No acute cervical spine fracture. Mild to moderate motion degradation. 6. Bilateral pleural effusions.  Consider chest radiograph. Electronically Signed   By: Genevive BiStewart  Edmunds  M.D.   On: 08/07/2019 11:28   Ct Cervical Spine Wo Contrast  Result Date: 08/07/2019 CLINICAL DATA:  Tripped and fell.  Landed on face and head. EXAM: CT HEAD WITHOUT CONTRAST CT MAXILLOFACIAL WITHOUT CONTRAST CT CERVICAL SPINE WITHOUT CONTRAST TECHNIQUE: Multidetector CT imaging of the head, cervical spine, and maxillofacial structures were performed using the standard protocol without intravenous contrast. Multiplanar CT image reconstructions of the cervical spine and maxillofacial structures were also generated. COMPARISON:  Head CT 08/07/2019, CTA neck 07/20/2019 FINDINGS: CT HEAD FINDINGS Brain: No acute intracranial hemorrhage. No focal mass lesion. No CT evidence of acute infarction. No midline shift or mass effect. No hydrocephalus. Basilar cisterns are patent. There are periventricular and subcortical white matter hypodensities. Generalized cortical atrophy. Small remote lacunar infarctions in the head of the LEFT caudate nucleus and the RIGHT lentiform nucleus. Vascular: No hyperdense vessel or unexpected calcification. Skull: Normal. Negative for fracture or  focal lesion. Sinuses/Orbits: Paranasal sinuses and mastoid air cells are clear. Orbits are clear. Other: Small scalp hematoma over the midline frontal bone. CT MAXILLOFACIAL FINDINGS Osseous: Orbital rims are intact. No fracture of the maxillary sinus walls. Pterygoid plates are normal. Mildly depressed fracture of the LEFT nasal bone (image 30/2). Mild flattening of the normal contour. Mandibular condyles are located.  No mandibular fracture contour. Orbits: Globes are intact. No proptosis. Intraconal contents are normal. Sinuses: Clear Soft tissues: Unremarkable CT CERVICAL SPINE FINDINGS Motion degradation most noted on the sagittal imaging. Alignment: Normal alignment of the cervical vertebral bodies. Skull base and vertebrae: Normal craniocervical junction. No loss of vertebral body height or disc height. Normal facet articulation. No evidence of fracture. Soft tissues and spinal canal: No prevertebral soft tissue swelling. No perispinal or epidural hematoma. Disc levels: Multiple levels of endplate spurring mL joint space narrowing. No acute findings Upper chest: Bilateral layering pleural effusions. Other: Large LEFT thyroid goiter measuring up to 4.5 cm not changed from comparison CTA. IMPRESSION: 1. No acute intracranial findings. Small scalp hematoma over the midline frontal bone. 2. Atrophy and white matter microvascular disease. Remote basal ganglia lacunar infarctions. 3. Mildly depressed LEFT NASAL BONE FRACTURE. 4. No additional facial bone fracture. 5. No acute cervical spine fracture. Mild to moderate motion degradation. 6. Bilateral pleural effusions.  Consider chest radiograph. Electronically Signed   By: Suzy Bouchard M.D.   On: 08/07/2019 11:28   Dg Chest Portable 1 View  Result Date: 08/07/2019 CLINICAL DATA:  Fall. EXAM: PORTABLE CHEST 1 VIEW COMPARISON:  Radiographs of July 28, 2019. FINDINGS: Stable cardiomediastinal silhouette. Atherosclerosis of thoracic aorta noted. Status  post coronary bypass graft. No pneumothorax. Moderate bilateral pleural effusions are noted with underlying atelectasis or infiltrate. Bony thorax is unremarkable. IMPRESSION: Moderate bilateral pleural effusions with underlying bibasilar atelectasis or infiltrates. Aortic Atherosclerosis (ICD10-I70.0). Electronically Signed   By: Marijo Conception M.D.   On: 08/07/2019 12:22   Ct Maxillofacial Wo Contrast  Result Date: 08/07/2019 CLINICAL DATA:  Tripped and fell.  Landed on face and head. EXAM: CT HEAD WITHOUT CONTRAST CT MAXILLOFACIAL WITHOUT CONTRAST CT CERVICAL SPINE WITHOUT CONTRAST TECHNIQUE: Multidetector CT imaging of the head, cervical spine, and maxillofacial structures were performed using the standard protocol without intravenous contrast. Multiplanar CT image reconstructions of the cervical spine and maxillofacial structures were also generated. COMPARISON:  Head CT 08/07/2019, CTA neck 07/20/2019 FINDINGS: CT HEAD FINDINGS Brain: No acute intracranial hemorrhage. No focal mass lesion. No CT evidence of acute infarction. No midline shift or mass effect. No hydrocephalus.  Basilar cisterns are patent. There are periventricular and subcortical white matter hypodensities. Generalized cortical atrophy. Small remote lacunar infarctions in the head of the LEFT caudate nucleus and the RIGHT lentiform nucleus. Vascular: No hyperdense vessel or unexpected calcification. Skull: Normal. Negative for fracture or focal lesion. Sinuses/Orbits: Paranasal sinuses and mastoid air cells are clear. Orbits are clear. Other: Small scalp hematoma over the midline frontal bone. CT MAXILLOFACIAL FINDINGS Osseous: Orbital rims are intact. No fracture of the maxillary sinus walls. Pterygoid plates are normal. Mildly depressed fracture of the LEFT nasal bone (image 30/2). Mild flattening of the normal contour. Mandibular condyles are located.  No mandibular fracture contour. Orbits: Globes are intact. No proptosis. Intraconal  contents are normal. Sinuses: Clear Soft tissues: Unremarkable CT CERVICAL SPINE FINDINGS Motion degradation most noted on the sagittal imaging. Alignment: Normal alignment of the cervical vertebral bodies. Skull base and vertebrae: Normal craniocervical junction. No loss of vertebral body height or disc height. Normal facet articulation. No evidence of fracture. Soft tissues and spinal canal: No prevertebral soft tissue swelling. No perispinal or epidural hematoma. Disc levels: Multiple levels of endplate spurring mL joint space narrowing. No acute findings Upper chest: Bilateral layering pleural effusions. Other: Large LEFT thyroid goiter measuring up to 4.5 cm not changed from comparison CTA. IMPRESSION: 1. No acute intracranial findings. Small scalp hematoma over the midline frontal bone. 2. Atrophy and white matter microvascular disease. Remote basal ganglia lacunar infarctions. 3. Mildly depressed LEFT NASAL BONE FRACTURE. 4. No additional facial bone fracture. 5. No acute cervical spine fracture. Mild to moderate motion degradation. 6. Bilateral pleural effusions.  Consider chest radiograph. Electronically Signed   By: Genevive Bi M.D.   On: 08/07/2019 11:28    ____________________________________________   PROCEDURES  Procedure(s) performed (including Critical Care):  Procedures   ____________________________________________   INITIAL IMPRESSION / ASSESSMENT AND PLAN / ED COURSE  As part of my medical decision making, I reviewed the following data within the electronic MEDICAL RECORD NUMBER Notes from prior ED visits and [place] Controlled Substance Database  85 year old female is brought to the ED after a mechanical fall that occurred in the medical mall while she was checking in to be screened for COVID prior to her doctor's appointment.  Because she is on blood thinners precautions were taken and a CT head, maxillofacial and cervical spine was ordered.  CT maxillofacial did show a nasal  fracture and also a hematoma to the forehead.  No head bleed was noted.  Right shoulder was negative for acute changes.  Chest x-ray showed bilateral pleural effusions that patient has had on prior x-rays and was questioned for possible infiltrate.  After discussing with Dr. Cyril Loosen with the absence of shortness of breath, fever, cough and this being a mechanical fall most likely this is not an infiltrate but her previous bilateral pleural effusions.  Patient's daughter was encouraged to call her doctor and prior to discharge patient was rescheduled for her appointment.  She is return to the emergency department if any severe worsening of her symptoms.  A prescription for tramadol which she has been on in the past was sent to her pharmacy if needed for pain.  Daughter states that there is someone with the patient at all times and she does have a walker at home which she uses. ____________________________________________   FINAL CLINICAL IMPRESSION(S) / ED DIAGNOSES  Final diagnoses:  Closed fracture of nasal bone, initial encounter  Contusion of face, initial encounter  Fall, initial encounter  Acute  pain of right shoulder     ED Discharge Orders         Ordered    traMADol (ULTRAM) 50 MG tablet     08/07/19 1257           Note:  This document was prepared using Dragon voice recognition software and may include unintentional dictation errors.    Tommi Rumps, PA-C 08/07/19 1504    Chesley Noon, MD 08/07/19 1714

## 2019-08-07 NOTE — Discharge Instructions (Addendum)
Follow-up with your primary care provider or your cardiologist if any continued problems.  Return to the emergency department if any severe worsening of your symptoms.  A prescription for a pain medication was sent to your pharmacy.  This medication could cause drowsiness and increase your risk for falling.  Use your walker when up walking.  You may also break these tablets in half if there is too much sedation when taking it for pain.  You may also use ice to your muscles and joints if needed for pain.  Follow-up with [facility] if any problems breathing through your nose once the swelling has gone down.  You may also apply ice packs to your nose to help reduce the swelling.

## 2019-08-07 NOTE — ED Triage Notes (Signed)
Presents with family s/p fall  States she tripped and fell face first  Hematoma and abrasions noted to forehead and around mouth  Also noted some bruising to left ankle

## 2019-08-09 NOTE — Progress Notes (Deleted)
Cardiology Office Note    Date:  08/09/2019   ID:  Lacey Coleman, DOB 02/09/1931, MRN [id number]  PCP:  Patient, No Pcp Per  Cardiologist:  Lacey BearsMuhammad Arida, MD  Electrophysiologist:  None   Chief Complaint: Hospital follow up  History of Present Illness:   Lacey Coleman is a 85 y.o. female with history of recently diagnosed multivessel CAD status post two-vessel CABG as detailed below, HFrEF secondary to ICM, postoperative A. fib, postoperative CVA with expressive aphasia, hypertension, and rheumatoid arthritis who presents for hospital follow-up as detailed below.  Patient was recently admitted to [facility] with a non-STEMI and found to have three-vessel CAD with high-grade left main disease and an EF of 25 to 30%.  In this setting she was [facility] and underwent two-vessel CABG on 07/19/2019 with a LIMA to LAD and SVG to OM.  The RCA was occluded and nongraftable.  Postoperative course was complicated by postoperative A. fib and CVA with a aphasia.  Intraoperative TEE showed an improved LV systolic function with an EF of 35 to 40%.  She was evaluated by PCP in late 07/2019 with subsequent increase in Lasix secondary to pedal edema.  Her aphasia had resolved by the time she was evaluated by the advanced heart failure clinic.  Weight trend was documented at 145 pounds to 137.5 pounds.  It was recommended she continue Lasix 20 mg twice daily and Spironolactone was added.  Amiodarone was discontinued.  Given her post operative CVA she was continued on apixaban.  ***   Labs: 08/2019 - sodium 134, potassium 4.6, serum creatinine 0.64, Hgb 10.0, PLT 386 07/2019 - total cholesterol 93, triglyceride 81, HDL 33, LDL 44, magnesium 2.2, A1c 5.5, albumin 3.3, AST/ALT normal *** update labs  Past Medical History:  Diagnosis Date  . CAD (coronary artery disease)    9/14 LHC with EF 25-35%, mod elevated LVEDP, dLM 90%, pLCx80%, pLAD to mLAD 90%, 1st diag 80%, mid Cx 100%, pRCA 90%, pRCA to mRCA  100%  . Heart failure with reduced ejection fraction (HCC)    a) 9/13 echo with EF 30-35%, mild LVF, diffuse hypokinesis, mod aortic calcification, mild AR  . Hypertension   . Rheumatoid arthritis [facility](HCC)     Past Surgical History:  Procedure Laterality Date  . CORONARY ARTERY BYPASS GRAFT N/A 07/19/2019   Procedure: CORONARY ARTERY BYPASS GRAFTING (CABG) x 2; Using Left Internal Mammary Artery (LIMA) and Right Leg Greater Saphenous Vein (SVG) harvested endoscopically; LIMA to LAD, SVG to Circ.;  Surgeon: Lacey Coleman, Broadus Z, MD;  Location: [facility] OR;  Service: Open Heart Surgery;  Laterality: N/A;  . RIGHT/LEFT HEART CATH AND CORONARY ANGIOGRAPHY N/A 07/17/2019   Procedure: RIGHT/LEFT HEART CATH AND CORONARY ANGIOGRAPHY;  Surgeon: Lacey Coleman, Muhammad A, MD;  Location: [facility] INVASIVE CV LAB;  Service: Cardiovascular;  Laterality: N/A;  . TEE WITHOUT CARDIOVERSION N/A 07/19/2019   Procedure: TRANSESOPHAGEAL ECHOCARDIOGRAM (TEE);  Surgeon: Lacey Coleman, Broadus Z, MD;  [facility];  Service: Open Heart Surgery;  Laterality: N/A;  . TONSILLECTOMY      Current Medications: No outpatient medications have been marked as taking for the 08/21/19 encounter (Appointment) with Sondra Bargesunn,  M, PA-C.    Allergies:   Patient has no known allergies.   Social History   Socioeconomic History  . Marital status: Single    Spouse name: Not on file  . Number of children: Not on file  . Years of education: Not on file  . Highest education  level: Not on file  Occupational History  . Not on file  Social Needs  . Financial resource strain: Not on file  . Food insecurity    Worry: Not on file    Inability: Not on file  . Transportation needs    Medical: Not on file    Non-medical: Not on file  Tobacco Use  . Smoking status: Never Smoker  . Smokeless tobacco: Never Used  Substance and Sexual Activity  . Alcohol use: Not Currently    Frequency: Never  . Drug use: Not Currently  . Sexual activity: Not on file   Lifestyle  . Physical activity    Days per week: Not on file    Minutes per session: Not on file  . Stress: Not on file  Relationships  . Social Herbalist on phone: Not on file    Gets together: Not on file    Attends religious service: Not on file    Active member of club or organization: Not on file    Attends meetings of clubs or organizations: Not on file    Relationship status: Not on file  Other Topics Concern  . Not on file  Social History Narrative  . Not on file     Family History:  The patient's family history is not on file.  ROS:   ROS   EKGs/Labs/Other Studies Reviewed:    Studies reviewed were summarized above. The additional studies were reviewed today:  2D Echo 07/16/2019:  1. The left ventricle has moderate-severely reduced systolic function, with an ejection fraction of 30-35%. The cavity size was normal. There is mildly increased left ventricular wall thickness. Left ventricular diastolic Doppler parameters are  consistent with impaired relaxation. Left ventricular diffuse hypokinesis.Unable to exclude regional wall motion abnormality.  2. The right ventricle has normal systolic function. The cavity was normal. There is no increase in right ventricular wall thickness.Unable to estimate RVSP.  3. Moderate calcification of the aortic valve. Aortic valve regurgitation is mild __________   [facility] 07/17/2019: The left ventricular ejection fraction is 25-35% by visual estimate.  There is moderate left ventricular systolic dysfunction.  LV end diastolic pressure is moderately elevated.  Dist LM lesion is 90% stenosed.  Prox Cx lesion is 80% stenosed.  Prox LAD to Mid LAD lesion is 90% stenosed.  1st Diag lesion is 80% stenosed.  Mid Cx lesion is 100% stenosed.  Prox RCA lesion is 90% stenosed.  Prox RCA to Mid RCA lesion is 100% stenosed.   1.  Severe three-vessel and left main coronary artery disease.  The coronary arteries are overall  moderately to severely calcified. 2.  Moderately to severely reduced LV systolic function with an EF of 30 to 35%.  Left ventricular angiogram was not optimal due to PVCs and poor filling the an echo was done before the cath. 3.  Right heart catheterization showed moderately elevated filling pressures with mean wedge pressure of 25 mmHg, moderate pulmonary hypertension at 50/24 mmHg and moderately severely reduced cardiac output at 3.41 with a cardiac index of 1.95.  Recommendations: The patient was given 1 dose of IV furosemide during catheterization given some difficulty breathing and right heart cath findings.  Continue gentle diuresis and treatment for heart failure and coronary artery disease. Resume heparin drip 8 hours after sheath pull. Transfer to [facility] for CABG evaluation. __________  2D Echo 07/21/2019: 1. Left ventricular ejection fraction, by visual estimation, is 25-30% with diffuse hypokinesis more  pronounced in the apical portions. The left ventricle has normal function. Normal left ventricular size. There is no left ventricular hypertrophy.  2. Global right ventricle has normal systolic function.The right ventricular size is normal. No increase in right ventricular wall thickness.  3. Left atrial size was normal.  4. Right atrial size was normal.  5. The mitral valve is normal in structure. Mild mitral valve regurgitation. No evidence of mitral stenosis.  6. The tricuspid valve is normal in structure. Tricuspid valve regurgitation was not visualized by color flow Doppler.  7. The aortic valve is tricuspid with severely thickened and calcified leaflets. Aortic valve regurgitation is mild by color flow Doppler. Moderate aortic valve stenosis.  8. The pulmonic valve was normal in structure. Pulmonic valve regurgitation is not visualized by color flow Doppler.  9. The inferior vena cava is normal in size with greater than 50% respiratory variability, suggesting right  atrial pressure of 3 mmHg. 10. Aortic stenosis is possibly severe in the settings of low flow low gradient aortic stenosis. 11. Definity contrast agent was given IV to delineate the left ventricular endocardial borders. 12. There is no evidence for an intracardiac thrombus on the Definity echocontrast images.    EKG:  EKG is ordered today.  The EKG ordered today demonstrates ***  Recent Labs: 07/16/2019: ALT 24; B Natriuretic Peptide 2,027.0 07/20/2019: Magnesium 2.2 08/03/2019: BUN 15; Creatinine, Ser 0.64; Hemoglobin 10.0; Platelets 386; Potassium 4.6; Sodium 134  Recent Lipid Panel    Component Value Date/Time   CHOL 93 07/21/2019 0444   TRIG 81 07/21/2019 0444   HDL 33 (L) 07/21/2019 0444   CHOLHDL 2.8 07/21/2019 0444   VLDL 16 07/21/2019 0444   LDLCALC 44 07/21/2019 0444    PHYSICAL EXAM:    VS:  There were no vitals taken for this visit.  BMI: There is no height or weight on file to calculate BMI.  Physical Exam  Wt Readings from Last 3 Encounters:  08/07/19 137 lb (62.1 kg)  08/03/19 137 lb 3.2 oz (62.2 kg)  07/24/19 143 lb (64.9 kg)     ASSESSMENT & PLAN:   1. ***  Disposition: F/u with Dr. Kirke Corin or an APP in ***.   Medication Adjustments/Labs and Tests Ordered: Current medicines are reviewed at length with the patient today.  Concerns regarding medicines are outlined above. Medication changes, Labs and Tests ordered today are summarized above and listed in the Patient Instructions accessible in Encounters.   Signed, Eula Listen, PA-C 08/09/2019 4:05 PM     [facility] [address] [place], [place] [address] [phone]

## 2019-08-18 ENCOUNTER — Ambulatory Visit
Admission: RE | Admit: 2019-08-18 | Discharge: 2019-08-18 | Disposition: A | Discharge status: Home / Self Care | Payer: Medicare Other | Source: Ambulatory Visit | Attending: Cardiothoracic Surgery | Admitting: Cardiothoracic Surgery

## 2019-08-18 ENCOUNTER — Other Ambulatory Visit: Payer: Self-pay

## 2019-08-18 ENCOUNTER — Other Ambulatory Visit: Payer: Self-pay | Admitting: Cardiothoracic Surgery

## 2019-08-18 ENCOUNTER — Ambulatory Visit (INDEPENDENT_AMBULATORY_CARE_PROVIDER_SITE_OTHER): Payer: Self-pay | Admitting: Cardiothoracic Surgery

## 2019-08-18 ENCOUNTER — Encounter: Payer: Self-pay | Admitting: Cardiothoracic Surgery

## 2019-08-18 ENCOUNTER — Ambulatory Visit: Payer: Medicare Other | Admitting: Cardiothoracic Surgery

## 2019-08-18 VITALS — BP 140/80 | HR 110 | Temp 97.3°F | Resp 20 | Ht 64.0 in | Wt 132.6 lb

## 2019-08-18 DIAGNOSIS — G4700 Insomnia, unspecified: Secondary | ICD-10-CM

## 2019-08-18 DIAGNOSIS — I2510 Atherosclerotic heart disease of native coronary artery without angina pectoris: Secondary | ICD-10-CM

## 2019-08-18 DIAGNOSIS — Z951 Presence of aortocoronary bypass graft: Secondary | ICD-10-CM

## 2019-08-18 MED ORDER — TEMAZEPAM 15 MG PO CAPS: 15.0000 mg | ORAL_CAPSULE | Freq: Every evening | ORAL | 0 refills | Status: AC | PRN

## 2019-08-21 ENCOUNTER — Other Ambulatory Visit: Payer: Self-pay | Admitting: Surgical

## 2019-08-21 ENCOUNTER — Ambulatory Visit: Payer: Medicare Other | Admitting: Physician Assistant

## 2019-08-25 ENCOUNTER — Inpatient Hospital Stay (HOSPITAL_COMMUNITY)
Admission: EM | Admit: 2019-08-25 | Discharge: 2019-10-03 | DRG: 870 | Disposition: E | Payer: Medicare Other | Attending: Pulmonary Disease | Admitting: Pulmonary Disease

## 2019-08-25 DIAGNOSIS — L89153 Pressure ulcer of sacral region, stage 3: Secondary | ICD-10-CM | POA: Diagnosis present

## 2019-08-25 DIAGNOSIS — Z0189 Encounter for other specified special examinations: Secondary | ICD-10-CM | POA: Diagnosis not present

## 2019-08-25 DIAGNOSIS — I639 Cerebral infarction, unspecified: Secondary | ICD-10-CM

## 2019-08-25 DIAGNOSIS — I255 Ischemic cardiomyopathy: Secondary | ICD-10-CM | POA: Diagnosis present

## 2019-08-25 DIAGNOSIS — Z7982 Long term (current) use of aspirin: Secondary | ICD-10-CM

## 2019-08-25 DIAGNOSIS — E042 Nontoxic multinodular goiter: Secondary | ICD-10-CM | POA: Diagnosis present

## 2019-08-25 DIAGNOSIS — Z09 Encounter for follow-up examination after completed treatment for conditions other than malignant neoplasm: Secondary | ICD-10-CM

## 2019-08-25 DIAGNOSIS — Z515 Encounter for palliative care: Secondary | ICD-10-CM

## 2019-08-25 DIAGNOSIS — J9601 Acute respiratory failure with hypoxia: Secondary | ICD-10-CM | POA: Diagnosis present

## 2019-08-25 DIAGNOSIS — I4430 Unspecified atrioventricular block: Secondary | ICD-10-CM | POA: Diagnosis present

## 2019-08-25 DIAGNOSIS — R739 Hyperglycemia, unspecified: Secondary | ICD-10-CM | POA: Diagnosis present

## 2019-08-25 DIAGNOSIS — I6330 Cerebral infarction due to thrombosis of unspecified cerebral artery: Secondary | ICD-10-CM | POA: Insufficient documentation

## 2019-08-25 DIAGNOSIS — J939 Pneumothorax, unspecified: Secondary | ICD-10-CM

## 2019-08-25 DIAGNOSIS — T462X5A Adverse effect of other antidysrhythmic drugs, initial encounter: Secondary | ICD-10-CM | POA: Diagnosis not present

## 2019-08-25 DIAGNOSIS — Z20828 Contact with and (suspected) exposure to other viral communicable diseases: Secondary | ICD-10-CM | POA: Diagnosis present

## 2019-08-25 DIAGNOSIS — R6521 Severe sepsis with septic shock: Secondary | ICD-10-CM | POA: Diagnosis not present

## 2019-08-25 DIAGNOSIS — I252 Old myocardial infarction: Secondary | ICD-10-CM

## 2019-08-25 DIAGNOSIS — M069 Rheumatoid arthritis, unspecified: Secondary | ICD-10-CM | POA: Diagnosis present

## 2019-08-25 DIAGNOSIS — Z9911 Dependence on respirator [ventilator] status: Secondary | ICD-10-CM

## 2019-08-25 DIAGNOSIS — I2510 Atherosclerotic heart disease of native coronary artery without angina pectoris: Secondary | ICD-10-CM | POA: Diagnosis present

## 2019-08-25 DIAGNOSIS — R34 Anuria and oliguria: Secondary | ICD-10-CM | POA: Diagnosis not present

## 2019-08-25 DIAGNOSIS — E871 Hypo-osmolality and hyponatremia: Secondary | ICD-10-CM | POA: Diagnosis present

## 2019-08-25 DIAGNOSIS — E785 Hyperlipidemia, unspecified: Secondary | ICD-10-CM | POA: Diagnosis present

## 2019-08-25 DIAGNOSIS — I970 Postcardiotomy syndrome: Secondary | ICD-10-CM | POA: Diagnosis present

## 2019-08-25 DIAGNOSIS — Z7901 Long term (current) use of anticoagulants: Secondary | ICD-10-CM

## 2019-08-25 DIAGNOSIS — L89892 Pressure ulcer of other site, stage 2: Secondary | ICD-10-CM | POA: Diagnosis present

## 2019-08-25 DIAGNOSIS — E162 Hypoglycemia, unspecified: Secondary | ICD-10-CM | POA: Diagnosis not present

## 2019-08-25 DIAGNOSIS — A4159 Other Gram-negative sepsis: Secondary | ICD-10-CM | POA: Diagnosis not present

## 2019-08-25 DIAGNOSIS — I509 Heart failure, unspecified: Secondary | ICD-10-CM

## 2019-08-25 DIAGNOSIS — I480 Paroxysmal atrial fibrillation: Secondary | ICD-10-CM | POA: Diagnosis present

## 2019-08-25 DIAGNOSIS — A419 Sepsis, unspecified organism: Secondary | ICD-10-CM

## 2019-08-25 DIAGNOSIS — Z8619 Personal history of other infectious and parasitic diseases: Secondary | ICD-10-CM

## 2019-08-25 DIAGNOSIS — Z951 Presence of aortocoronary bypass graft: Secondary | ICD-10-CM

## 2019-08-25 DIAGNOSIS — I110 Hypertensive heart disease with heart failure: Secondary | ICD-10-CM | POA: Diagnosis present

## 2019-08-25 DIAGNOSIS — D62 Acute posthemorrhagic anemia: Secondary | ICD-10-CM | POA: Diagnosis not present

## 2019-08-25 DIAGNOSIS — G9341 Metabolic encephalopathy: Secondary | ICD-10-CM | POA: Diagnosis present

## 2019-08-25 DIAGNOSIS — R0602 Shortness of breath: Secondary | ICD-10-CM

## 2019-08-25 DIAGNOSIS — N179 Acute kidney failure, unspecified: Secondary | ICD-10-CM | POA: Diagnosis not present

## 2019-08-25 DIAGNOSIS — R54 Age-related physical debility: Secondary | ICD-10-CM | POA: Diagnosis present

## 2019-08-25 DIAGNOSIS — Z66 Do not resuscitate: Secondary | ICD-10-CM | POA: Diagnosis present

## 2019-08-25 DIAGNOSIS — L8990 Pressure ulcer of unspecified site, unspecified stage: Secondary | ICD-10-CM | POA: Insufficient documentation

## 2019-08-25 DIAGNOSIS — N390 Urinary tract infection, site not specified: Secondary | ICD-10-CM | POA: Diagnosis present

## 2019-08-25 DIAGNOSIS — J9602 Acute respiratory failure with hypercapnia: Principal | ICD-10-CM

## 2019-08-25 DIAGNOSIS — Z6824 Body mass index (BMI) 24.0-24.9, adult: Secondary | ICD-10-CM

## 2019-08-25 DIAGNOSIS — J95811 Postprocedural pneumothorax: Secondary | ICD-10-CM | POA: Diagnosis not present

## 2019-08-25 DIAGNOSIS — R4182 Altered mental status, unspecified: Secondary | ICD-10-CM | POA: Diagnosis present

## 2019-08-25 DIAGNOSIS — J9690 Respiratory failure, unspecified, unspecified whether with hypoxia or hypercapnia: Secondary | ICD-10-CM

## 2019-08-25 DIAGNOSIS — I4892 Unspecified atrial flutter: Secondary | ICD-10-CM | POA: Diagnosis present

## 2019-08-25 DIAGNOSIS — E872 Acidosis: Secondary | ICD-10-CM | POA: Diagnosis present

## 2019-08-25 DIAGNOSIS — I5043 Acute on chronic combined systolic (congestive) and diastolic (congestive) heart failure: Secondary | ICD-10-CM | POA: Diagnosis present

## 2019-08-25 DIAGNOSIS — J90 Pleural effusion, not elsewhere classified: Secondary | ICD-10-CM

## 2019-08-25 DIAGNOSIS — I083 Combined rheumatic disorders of mitral, aortic and tricuspid valves: Secondary | ICD-10-CM | POA: Diagnosis present

## 2019-08-25 DIAGNOSIS — R64 Cachexia: Secondary | ICD-10-CM | POA: Diagnosis present

## 2019-08-25 LAB — CBG MONITORING, ED: Glucose-Capillary: 188 mg/dL — ABNORMAL HIGH (ref 70–99)

## 2019-08-25 NOTE — ED Triage Notes (Addendum)
Pt BIB [facility], found unresponsive in her chair at home by family, on EMS arrival, pt with cyanotic lips, pt placed on NRB with improvement. EMS reports pt able to follow commands while en route. On arrival, pt not following commands, tachypneic and tachycaardic. Pt with bruising to face, EMS and family report fall during last hospital admission.

## 2019-08-26 ENCOUNTER — Inpatient Hospital Stay (HOSPITAL_COMMUNITY): Payer: Medicare Other

## 2019-08-26 ENCOUNTER — Other Ambulatory Visit: Payer: Self-pay

## 2019-08-26 ENCOUNTER — Emergency Department (HOSPITAL_COMMUNITY): Payer: Medicare Other

## 2019-08-26 ENCOUNTER — Encounter (HOSPITAL_COMMUNITY): Payer: Self-pay | Admitting: Emergency Medicine

## 2019-08-26 DIAGNOSIS — J9383 Other pneumothorax: Secondary | ICD-10-CM | POA: Diagnosis not present

## 2019-08-26 DIAGNOSIS — J9602 Acute respiratory failure with hypercapnia: Secondary | ICD-10-CM

## 2019-08-26 DIAGNOSIS — D72829 Elevated white blood cell count, unspecified: Secondary | ICD-10-CM | POA: Diagnosis not present

## 2019-08-26 DIAGNOSIS — N390 Urinary tract infection, site not specified: Secondary | ICD-10-CM | POA: Diagnosis not present

## 2019-08-26 DIAGNOSIS — R6521 Severe sepsis with septic shock: Secondary | ICD-10-CM | POA: Diagnosis not present

## 2019-08-26 DIAGNOSIS — D62 Acute posthemorrhagic anemia: Secondary | ICD-10-CM | POA: Diagnosis not present

## 2019-08-26 DIAGNOSIS — I970 Postcardiotomy syndrome: Secondary | ICD-10-CM | POA: Diagnosis present

## 2019-08-26 DIAGNOSIS — I480 Paroxysmal atrial fibrillation: Secondary | ICD-10-CM | POA: Diagnosis not present

## 2019-08-26 DIAGNOSIS — J90 Pleural effusion, not elsewhere classified: Secondary | ICD-10-CM

## 2019-08-26 DIAGNOSIS — Z515 Encounter for palliative care: Secondary | ICD-10-CM | POA: Diagnosis not present

## 2019-08-26 DIAGNOSIS — I639 Cerebral infarction, unspecified: Secondary | ICD-10-CM | POA: Diagnosis not present

## 2019-08-26 DIAGNOSIS — J9601 Acute respiratory failure with hypoxia: Secondary | ICD-10-CM

## 2019-08-26 DIAGNOSIS — I340 Nonrheumatic mitral (valve) insufficiency: Secondary | ICD-10-CM | POA: Diagnosis not present

## 2019-08-26 DIAGNOSIS — G9340 Encephalopathy, unspecified: Secondary | ICD-10-CM | POA: Diagnosis not present

## 2019-08-26 DIAGNOSIS — Z0189 Encounter for other specified special examinations: Secondary | ICD-10-CM | POA: Diagnosis present

## 2019-08-26 DIAGNOSIS — Z951 Presence of aortocoronary bypass graft: Secondary | ICD-10-CM

## 2019-08-26 DIAGNOSIS — R4182 Altered mental status, unspecified: Secondary | ICD-10-CM | POA: Diagnosis not present

## 2019-08-26 DIAGNOSIS — R579 Shock, unspecified: Secondary | ICD-10-CM | POA: Diagnosis not present

## 2019-08-26 DIAGNOSIS — A4159 Other Gram-negative sepsis: Secondary | ICD-10-CM | POA: Diagnosis not present

## 2019-08-26 DIAGNOSIS — I110 Hypertensive heart disease with heart failure: Secondary | ICD-10-CM | POA: Diagnosis present

## 2019-08-26 DIAGNOSIS — N179 Acute kidney failure, unspecified: Secondary | ICD-10-CM | POA: Diagnosis not present

## 2019-08-26 DIAGNOSIS — I2510 Atherosclerotic heart disease of native coronary artery without angina pectoris: Secondary | ICD-10-CM | POA: Diagnosis present

## 2019-08-26 DIAGNOSIS — R410 Disorientation, unspecified: Secondary | ICD-10-CM | POA: Diagnosis not present

## 2019-08-26 DIAGNOSIS — R64 Cachexia: Secondary | ICD-10-CM | POA: Diagnosis present

## 2019-08-26 DIAGNOSIS — I4892 Unspecified atrial flutter: Secondary | ICD-10-CM | POA: Diagnosis not present

## 2019-08-26 DIAGNOSIS — Z20828 Contact with and (suspected) exposure to other viral communicable diseases: Secondary | ICD-10-CM | POA: Diagnosis not present

## 2019-08-26 DIAGNOSIS — G9341 Metabolic encephalopathy: Secondary | ICD-10-CM | POA: Diagnosis not present

## 2019-08-26 DIAGNOSIS — J189 Pneumonia, unspecified organism: Secondary | ICD-10-CM | POA: Diagnosis not present

## 2019-08-26 DIAGNOSIS — Z66 Do not resuscitate: Secondary | ICD-10-CM | POA: Diagnosis not present

## 2019-08-26 DIAGNOSIS — M7989 Other specified soft tissue disorders: Secondary | ICD-10-CM

## 2019-08-26 DIAGNOSIS — E871 Hypo-osmolality and hyponatremia: Secondary | ICD-10-CM | POA: Diagnosis present

## 2019-08-26 DIAGNOSIS — Z9911 Dependence on respirator [ventilator] status: Secondary | ICD-10-CM | POA: Diagnosis not present

## 2019-08-26 DIAGNOSIS — L8990 Pressure ulcer of unspecified site, unspecified stage: Secondary | ICD-10-CM | POA: Insufficient documentation

## 2019-08-26 DIAGNOSIS — L89153 Pressure ulcer of sacral region, stage 3: Secondary | ICD-10-CM | POA: Diagnosis not present

## 2019-08-26 DIAGNOSIS — E872 Acidosis: Secondary | ICD-10-CM | POA: Diagnosis present

## 2019-08-26 DIAGNOSIS — M069 Rheumatoid arthritis, unspecified: Secondary | ICD-10-CM | POA: Diagnosis present

## 2019-08-26 DIAGNOSIS — Z7901 Long term (current) use of anticoagulants: Secondary | ICD-10-CM | POA: Diagnosis not present

## 2019-08-26 DIAGNOSIS — Z7189 Other specified counseling: Secondary | ICD-10-CM | POA: Diagnosis not present

## 2019-08-26 DIAGNOSIS — I5043 Acute on chronic combined systolic (congestive) and diastolic (congestive) heart failure: Secondary | ICD-10-CM | POA: Diagnosis not present

## 2019-08-26 DIAGNOSIS — I5033 Acute on chronic diastolic (congestive) heart failure: Secondary | ICD-10-CM | POA: Diagnosis not present

## 2019-08-26 DIAGNOSIS — J95811 Postprocedural pneumothorax: Secondary | ICD-10-CM | POA: Diagnosis not present

## 2019-08-26 DIAGNOSIS — J9611 Chronic respiratory failure with hypoxia: Secondary | ICD-10-CM | POA: Diagnosis not present

## 2019-08-26 DIAGNOSIS — I6330 Cerebral infarction due to thrombosis of unspecified cerebral artery: Secondary | ICD-10-CM | POA: Diagnosis not present

## 2019-08-26 LAB — BASIC METABOLIC PANEL
Anion gap: 13 (ref 5–15)
Anion gap: 10 (ref 5–15)
BUN: 15 mg/dL (ref 8–23)
BUN: 14 mg/dL (ref 8–23)
CO2: 16 mmol/L — ABNORMAL LOW (ref 22–32)
CO2: 19 mmol/L — ABNORMAL LOW (ref 22–32)
Calcium: 9 mg/dL (ref 8.9–10.3)
Calcium: 9.4 mg/dL (ref 8.9–10.3)
Chloride: 96 mmol/L — ABNORMAL LOW (ref 98–111)
Chloride: 99 mmol/L (ref 98–111)
Creatinine, Ser: 0.62 mg/dL (ref 0.44–1.00)
Creatinine, Ser: 0.69 mg/dL (ref 0.44–1.00)
GFR calc Af Amer: >60 mL/min (ref >60)
GFR calc Af Amer: >60 mL/min (ref >60)
GFR calc non Af Amer: >60 mL/min (ref >60)
GFR calc non Af Amer: >60 mL/min (ref >60)
Glucose, Bld: 144 mg/dL — ABNORMAL HIGH (ref 70–99)
Glucose, Bld: 117 mg/dL — ABNORMAL HIGH (ref 70–99)
Potassium: 5.1 mmol/L (ref 3.5–5.1)
Potassium: 4.4 mmol/L (ref 3.5–5.1)
Sodium: 125 mmol/L — ABNORMAL LOW (ref 135–145)
Sodium: 128 mmol/L — ABNORMAL LOW (ref 135–145)

## 2019-08-26 LAB — TROPONIN I (HIGH SENSITIVITY): Troponin I (High Sensitivity): 10 ng/L (ref <18)

## 2019-08-26 LAB — TYPE AND SCREEN
ABO/RH(D): O POS
Antibody Screen: NEGATIVE

## 2019-08-26 LAB — RESPIRATORY PANEL BY PCR

## 2019-08-26 LAB — POCT I-STAT 7, (LYTES, BLD GAS, ICA,H+H)
Acid-base deficit: 3 mmol/L — ABNORMAL HIGH (ref 0.0–2.0)
Acid-base deficit: 3 mmol/L — ABNORMAL HIGH (ref 0.0–2.0)
Bicarbonate: 20.2 mmol/L (ref 20.0–28.0)
Bicarbonate: 24.5 mmol/L (ref 20.0–28.0)
Calcium, Ion: 1.36 mmol/L (ref 1.15–1.40)
Calcium, Ion: 1.42 mmol/L — ABNORMAL HIGH (ref 1.15–1.40)
HCT: 31 % — ABNORMAL LOW (ref 36.0–46.0)
HCT: 37 % (ref 36.0–46.0)
Hemoglobin: 10.5 g/dL — ABNORMAL LOW (ref 12.0–15.0)
Hemoglobin: 12.6 g/dL (ref 12.0–15.0)
O2 Saturation: 100 %
O2 Saturation: 99 %
Patient temperature: 98.6
Patient temperature: 98.7
Potassium: 4.2 mmol/L (ref 3.5–5.1)
Potassium: 5 mmol/L (ref 3.5–5.1)
Sodium: 122 mmol/L — ABNORMAL LOW (ref 135–145)
Sodium: 126 mmol/L — ABNORMAL LOW (ref 135–145)
TCO2: 21 mmol/L — ABNORMAL LOW (ref 22–32)
TCO2: 26 mmol/L (ref 22–32)
pCO2 arterial: 29.5 mmHg — ABNORMAL LOW (ref 32.0–48.0)
pCO2 arterial: 51.7 mmHg — ABNORMAL HIGH (ref 32.0–48.0)
pH, Arterial: 7.284 — ABNORMAL LOW (ref 7.350–7.450)
pH, Arterial: 7.443 (ref 7.350–7.450)
pO2, Arterial: 133 mmHg — ABNORMAL HIGH (ref 83.0–108.0)
pO2, Arterial: 213 mmHg — ABNORMAL HIGH (ref 83.0–108.0)

## 2019-08-26 LAB — CBC WITH DIFFERENTIAL/PLATELET
Abs Immature Granulocytes: 0.22 10*3/uL — ABNORMAL HIGH (ref 0.00–0.07)
Basophils Absolute: 0 10*3/uL (ref 0.0–0.1)
Basophils Relative: 0 %
Eosinophils Absolute: 0 10*3/uL (ref 0.0–0.5)
Eosinophils Relative: 0 %
HCT: 39.3 % (ref 36.0–46.0)
Hemoglobin: 11.7 g/dL — ABNORMAL LOW (ref 12.0–15.0)
Immature Granulocytes: 1 %
Lymphocytes Relative: 6 %
Lymphs Abs: 1.2 10*3/uL (ref 0.7–4.0)
MCH: 22.4 pg — ABNORMAL LOW (ref 26.0–34.0)
MCHC: 29.8 g/dL — ABNORMAL LOW (ref 30.0–36.0)
MCV: 75.3 fL — ABNORMAL LOW (ref 80.0–100.0)
Monocytes Absolute: 2.3 10*3/uL — ABNORMAL HIGH (ref 0.1–1.0)
Monocytes Relative: 12 %
Neutro Abs: 16.1 10*3/uL — ABNORMAL HIGH (ref 1.7–7.7)
Neutrophils Relative %: 81 %
Platelets: 464 10*3/uL — ABNORMAL HIGH (ref 150–400)
RBC: 5.22 MIL/uL — ABNORMAL HIGH (ref 3.87–5.11)
RDW: 17.2 % — ABNORMAL HIGH (ref 11.5–15.5)
WBC: 19.8 10*3/uL — ABNORMAL HIGH (ref 4.0–10.5)
nRBC: 0 % (ref 0.0–0.2)

## 2019-08-26 LAB — URINALYSIS, ROUTINE W REFLEX MICROSCOPIC
Bilirubin Urine: NEGATIVE
Glucose, UA: NEGATIVE mg/dL
Hgb urine dipstick: NEGATIVE
Ketones, ur: NEGATIVE mg/dL
Leukocytes,Ua: NEGATIVE
Nitrite: NEGATIVE
Protein, ur: NEGATIVE mg/dL
Specific Gravity, Urine: 1.015 (ref 1.005–1.030)
pH: 5 (ref 5.0–8.0)

## 2019-08-26 LAB — CBC
HCT: 32.2 % — ABNORMAL LOW (ref 36.0–46.0)
Hemoglobin: 9.9 g/dL — ABNORMAL LOW (ref 12.0–15.0)
MCH: 22.3 pg — ABNORMAL LOW (ref 26.0–34.0)
MCHC: 30.7 g/dL (ref 30.0–36.0)
MCV: 72.7 fL — ABNORMAL LOW (ref 80.0–100.0)
Platelets: 292 10*3/uL (ref 150–400)
RBC: 4.43 MIL/uL (ref 3.87–5.11)
RDW: 16.7 % — ABNORMAL HIGH (ref 11.5–15.5)
WBC: 18.4 10*3/uL — ABNORMAL HIGH (ref 4.0–10.5)
nRBC: 0 % (ref 0.0–0.2)

## 2019-08-26 LAB — HEPATIC FUNCTION PANEL
ALT: 21 U/L (ref 0–44)
AST: 28 U/L (ref 15–41)
Albumin: 2.5 g/dL — ABNORMAL LOW (ref 3.5–5.0)
Alkaline Phosphatase: 110 U/L (ref 38–126)
Bilirubin, Direct: 0.4 mg/dL — ABNORMAL HIGH (ref 0.0–0.2)
Indirect Bilirubin: 1.1 mg/dL — ABNORMAL HIGH (ref 0.3–0.9)
Total Bilirubin: 1.5 mg/dL — ABNORMAL HIGH (ref 0.3–1.2)
Total Protein: 4.8 g/dL — ABNORMAL LOW (ref 6.5–8.1)

## 2019-08-26 LAB — GLUCOSE, CAPILLARY
Glucose-Capillary: 141 mg/dL — ABNORMAL HIGH (ref 70–99)
Glucose-Capillary: 92 mg/dL (ref 70–99)
Glucose-Capillary: 78 mg/dL (ref 70–99)
Glucose-Capillary: 87 mg/dL (ref 70–99)
Glucose-Capillary: 103 mg/dL — ABNORMAL HIGH (ref 70–99)

## 2019-08-26 LAB — LACTIC ACID, PLASMA
Lactic Acid, Venous: 2.5 mmol/L (ref 0.5–1.9)
Lactic Acid, Venous: 2.3 mmol/L (ref 0.5–1.9)

## 2019-08-26 LAB — BRAIN NATRIURETIC PEPTIDE: B Natriuretic Peptide: 497.7 pg/mL — ABNORMAL HIGH (ref 0.0–100.0)

## 2019-08-26 LAB — PHOSPHORUS
Phosphorus: 4.8 mg/dL — ABNORMAL HIGH (ref 2.5–4.6)
Phosphorus: 4 mg/dL (ref 2.5–4.6)

## 2019-08-26 LAB — SARS CORONAVIRUS 2 BY RT PCR (HOSPITAL ORDER, PERFORMED IN ~~LOC~~ HOSPITAL LAB): SARS Coronavirus 2: NEGATIVE

## 2019-08-26 LAB — PROTIME-INR
INR: 1.1 (ref 0.8–1.2)
Prothrombin Time: 14.4 seconds (ref 11.4–15.2)

## 2019-08-26 LAB — RAPID URINE DRUG SCREEN, HOSP PERFORMED
Amphetamines: NOT DETECTED
Barbiturates: NOT DETECTED
Benzodiazepines: POSITIVE — AB
Cocaine: NOT DETECTED
Opiates: NOT DETECTED
Tetrahydrocannabinol: NOT DETECTED

## 2019-08-26 LAB — ECHOCARDIOGRAM COMPLETE
Height: 64 in
Weight: 2243.4 oz

## 2019-08-26 LAB — MAGNESIUM
Magnesium: 1.8 mg/dL (ref 1.7–2.4)
Magnesium: 1.8 mg/dL (ref 1.7–2.4)

## 2019-08-26 LAB — PROCALCITONIN: Procalcitonin: <0.1 ng/mL

## 2019-08-26 LAB — HEMOGLOBIN A1C
Hgb A1c MFr Bld: 5.5 % (ref 4.8–5.6)
Mean Plasma Glucose: 111.15 mg/dL

## 2019-08-26 LAB — MRSA PCR SCREENING: MRSA by PCR: NEGATIVE

## 2019-08-26 LAB — AMMONIA: Ammonia: 15 umol/L (ref 9–35)

## 2019-08-26 LAB — CORTISOL: Cortisol, Plasma: 36.7 ug/dL

## 2019-08-26 LAB — POC OCCULT BLOOD, ED: Fecal Occult Bld: NEGATIVE

## 2019-08-26 MED ORDER — SODIUM CHLORIDE 0.9 % IV BOLUS (SEPSIS)
1000.0000 mL | Freq: Once | INTRAVENOUS | Status: AC
  Administered 2019-08-26: 1000 mL via INTRAVENOUS

## 2019-08-26 MED ORDER — SODIUM CHLORIDE 0.9 % IV SOLN
INTRAVENOUS | Status: AC | PRN
  Administered 2019-08-26: 1000 mL via INTRAVENOUS

## 2019-08-26 MED ORDER — ETOMIDATE 2 MG/ML IV SOLN
INTRAVENOUS | Status: AC | PRN
  Administered 2019-08-26: 20 mg via INTRAVENOUS

## 2019-08-26 MED ORDER — ONDANSETRON HCL 4 MG/2ML IJ SOLN
4.0000 mg | Freq: Four times a day (QID) | INTRAMUSCULAR | Status: DC | PRN
  Administered 2019-09-03: 4 mg via INTRAVENOUS
  Filled 2019-08-26: qty 2

## 2019-08-26 MED ORDER — LACTATED RINGERS IV SOLN
INTRAVENOUS | Status: DC
  Administered 2019-08-26 – 2019-08-29 (×4): via INTRAVENOUS

## 2019-08-26 MED ORDER — SODIUM CHLORIDE 0.9 % IV SOLN
2.0000 g | Freq: Once | INTRAVENOUS | Status: AC
  Administered 2019-08-26: 2 g via INTRAVENOUS
  Filled 2019-08-26: qty 2

## 2019-08-26 MED ORDER — ORAL CARE MOUTH RINSE
15.0000 mL | OROMUCOSAL | Status: DC
  Administered 2019-08-26 – 2019-09-06 (×99): 15 mL via OROMUCOSAL

## 2019-08-26 MED ORDER — METOPROLOL TARTRATE 5 MG/5ML IV SOLN
2.5000 mg | INTRAVENOUS | Status: DC | PRN
  Administered 2019-08-27 – 2019-08-29 (×6): 2.5 mg via INTRAVENOUS
  Filled 2019-08-26 (×6): qty 5

## 2019-08-26 MED ORDER — NOREPINEPHRINE 4 MG/250ML-% IV SOLN
INTRAVENOUS | Status: AC
  Filled 2019-08-26: qty 250

## 2019-08-26 MED ORDER — INSULIN ASPART 100 UNIT/ML ~~LOC~~ SOLN
0.0000 [IU] | SUBCUTANEOUS | Status: DC
  Administered 2019-08-26 – 2019-08-29 (×8): 1 [IU] via SUBCUTANEOUS
  Administered 2019-08-30: 2 [IU] via SUBCUTANEOUS
  Administered 2019-08-30 (×3): 1 [IU] via SUBCUTANEOUS
  Administered 2019-08-30 (×2): 2 [IU] via SUBCUTANEOUS
  Administered 2019-08-31 – 2019-09-01 (×7): 1 [IU] via SUBCUTANEOUS
  Administered 2019-09-02: 2 [IU] via SUBCUTANEOUS
  Administered 2019-09-02 (×3): 1 [IU] via SUBCUTANEOUS
  Administered 2019-09-02: 2 [IU] via SUBCUTANEOUS
  Administered 2019-09-03 – 2019-09-05 (×9): 1 [IU] via SUBCUTANEOUS

## 2019-08-26 MED ORDER — METOPROLOL TARTRATE 5 MG/5ML IV SOLN
2.5000 mg | Freq: Once | INTRAVENOUS | Status: AC
  Administered 2019-08-26: 2.5 mg via INTRAVENOUS
  Filled 2019-08-26: qty 5

## 2019-08-26 MED ORDER — CHLORHEXIDINE GLUCONATE 0.12% ORAL RINSE (MEDLINE KIT): 15.0000 mL | Freq: Two times a day (BID) | OROMUCOSAL | Status: DC

## 2019-08-26 MED ORDER — VITAL HIGH PROTEIN PO LIQD
1000.0000 mL | ORAL | Status: DC
  Administered 2019-08-26 – 2019-08-30 (×4): 1000 mL
  Filled 2019-08-26 (×2): qty 1000

## 2019-08-26 MED ORDER — FENTANYL 2500MCG IN NS 250ML (10MCG/ML) PREMIX INFUSION
0.0000 ug/h | INTRAVENOUS | Status: DC
  Administered 2019-08-26: 50 ug/h via INTRAVENOUS
  Administered 2019-08-27: 04:00:00 75 ug/h via INTRAVENOUS
  Administered 2019-08-28: 20:00:00 50 ug/h via INTRAVENOUS
  Administered 2019-08-30: 12:00:00 25 ug/h via INTRAVENOUS
  Administered 2019-08-31: 14:00:00 100 ug/h via INTRAVENOUS
  Administered 2019-09-01: 17:00:00 50 ug/h via INTRAVENOUS
  Administered 2019-09-02 – 2019-09-03 (×2): 100 ug/h via INTRAVENOUS
  Filled 2019-08-26 (×9): qty 250

## 2019-08-26 MED ORDER — SODIUM CHLORIDE 0.9 % IV SOLN
2.0000 g | Freq: Two times a day (BID) | INTRAVENOUS | Status: DC
  Administered 2019-08-26 – 2019-08-28 (×6): 2 g via INTRAVENOUS
  Filled 2019-08-26 (×5): qty 2

## 2019-08-26 MED ORDER — ALBUMIN HUMAN 25 % IV SOLN
25.0000 g | Freq: Once | INTRAVENOUS | Status: AC
  Administered 2019-08-26: 25 g via INTRAVENOUS
  Filled 2019-08-26: qty 200

## 2019-08-26 MED ORDER — SODIUM CHLORIDE 0.9 % IV SOLN
INTRAVENOUS | Status: AC
  Administered 2019-08-26: 13:00:00 via INTRAVENOUS

## 2019-08-26 MED ORDER — HEPARIN SODIUM (PORCINE) 5000 UNIT/ML IJ SOLN
5000.0000 [IU] | Freq: Three times a day (TID) | INTRAMUSCULAR | Status: DC
  Administered 2019-08-26 – 2019-08-29 (×9): 5000 [IU] via SUBCUTANEOUS
  Filled 2019-08-26 (×9): qty 1

## 2019-08-26 MED ORDER — SUCCINYLCHOLINE CHLORIDE 20 MG/ML IJ SOLN
INTRAMUSCULAR | Status: AC | PRN
  Administered 2019-08-26: 100 mg via INTRAVENOUS

## 2019-08-26 MED ORDER — CHLORHEXIDINE GLUCONATE CLOTH 2 % EX PADS
6.0000 | MEDICATED_PAD | Freq: Every day | CUTANEOUS | Status: DC
  Administered 2019-08-26 – 2019-09-05 (×10): 6 via TOPICAL

## 2019-08-26 MED ORDER — NOREPINEPHRINE 4 MG/250ML-% IV SOLN
0.0000 ug/min | INTRAVENOUS | Status: DC
  Administered 2019-08-26: 5 ug/min via INTRAVENOUS
  Administered 2019-08-26: 2 ug/min via INTRAVENOUS

## 2019-08-26 MED ORDER — SUCCINYLCHOLINE CHLORIDE 20 MG/ML IJ SOLN: 100.0000 mg | Freq: Once | INTRAMUSCULAR | Status: DC

## 2019-08-26 MED ORDER — PROPOFOL 1000 MG/100ML IV EMUL: 5.0000 ug/kg/min | INTRAVENOUS | Status: DC

## 2019-08-26 MED ORDER — PRO-STAT SUGAR FREE PO LIQD
30.0000 mL | Freq: Two times a day (BID) | ORAL | Status: DC
  Administered 2019-08-26 – 2019-08-30 (×9): 30 mL
  Filled 2019-08-26 (×8): qty 30

## 2019-08-26 MED ORDER — ORAL CARE MOUTH RINSE: 15.0000 mL | OROMUCOSAL | Status: DC

## 2019-08-26 MED ORDER — CHLORHEXIDINE GLUCONATE 0.12% ORAL RINSE (MEDLINE KIT)
15.0000 mL | Freq: Two times a day (BID) | OROMUCOSAL | Status: DC
  Administered 2019-08-26 – 2019-09-06 (×23): 15 mL via OROMUCOSAL

## 2019-08-26 MED ORDER — ACETAMINOPHEN 325 MG PO TABS: 650.0000 mg | ORAL_TABLET | ORAL | Status: DC | PRN

## 2019-08-26 MED ORDER — VANCOMYCIN HCL IN DEXTROSE 1-5 GM/200ML-% IV SOLN
1000.0000 mg | Freq: Once | INTRAVENOUS | Status: AC
  Administered 2019-08-26: 1000 mg via INTRAVENOUS
  Filled 2019-08-26: qty 200

## 2019-08-26 MED ORDER — SODIUM CHLORIDE 0.9 % IV SOLN
1000.0000 mL | INTRAVENOUS | Status: DC
  Administered 2019-08-26: 1000 mL via INTRAVENOUS

## 2019-08-26 MED ORDER — ETOMIDATE 2 MG/ML IV SOLN: 20.0000 mg | Freq: Once | INTRAVENOUS | Status: DC

## 2019-08-26 MED ORDER — PANTOPRAZOLE SODIUM 40 MG IV SOLR
40.0000 mg | Freq: Every day | INTRAVENOUS | Status: DC
  Administered 2019-08-26 – 2019-08-28 (×3): 40 mg via INTRAVENOUS
  Filled 2019-08-26 (×3): qty 40

## 2019-08-26 NOTE — Consult Note (Addendum)
NAME:  Lacey Coleman, MRN:  [id number], DOB:  1931/08/27, LOS: 0 ADMISSION DATE:  2019/08/28, CONSULTATION DATE:  08/26/19 REFERRING MD:  Ward , CHIEF COMPLAINT:  AMS   Brief History   85 yo F found unresponsive at home. Upon arrival with AMS, intubated.   History of present illness   History obtained from chart  85 yo F PMH CAD s/p CABG, CHF, HTN, RA, A fib (eliquis dc 1 week ago) who presents 10/23 with AMS. Patient was found unresponsive at home in chair, found by family. Upon EMS arrival, SpO2 30%.  EMS reported ability to follow commands en route after being placed on NRB; EMS reports degree of aphagia. In ED, patient not following commands, and is subsequently intubated due to hypoxia. Concern for CVA due to AMS; CT  H obtained and is concerning for acute vs subacute infarct. Neurology informally consulted by EDP and will see if MRI brain reveals acute infarct. Patient determined not to be candidate for tPA.   Per patient's daughter, patient experienced a fall during hospitalization approx 1 month ago and has been weak since discharge home.   Lactic acid 2.5, WBC 19.8, ABG 7.28/51/133. BMP pendng  COVID-19 neg Started of vanc and cefepime in ED   Past Medical History  CAD  CHF HTN RA  A fib  Significant Hospital Events   10/23 presents near midnight. AMS. Intubated for airway protection. CT H reveals acute vs subacute infarct   Consults:    Procedures:  10/24 ETT>>>   Significant Diagnostic Tests:  10/24 CXR> bilateral pleural effusions, bibasilar atelectasis vs infiltrate   10/24 CT H non con> 1. No acute intracranial hemorrhage. 2. Mild age-related atrophy and chronic microvascular ischemic changes. 3. Bilateral frontal white matter hypodense lesions, new since theprior CT of 08/07/2019 and concerning for areas of subacute or acute infarct. Clinical correlation is recommended. MRI may provide betterevaluation if clinically indicated.  10/24 MRI brain>>>   Micro  Data:  10/24 SARS CoV2> neg  Antimicrobials:  10/24 vanc> 10/24 cefepime>   Interim history/subjective:  Intubated, sedated. Borderline BP.   Objective   Blood pressure 93/63, pulse 91, temperature (!) 95.8 F (35.4 C), resp. rate 16, SpO2 100 %.    Vent Mode: PRVC FiO2 (%):  [100 %] 100 % Set Rate:  [16 bmp] 16 bmp Vt Set:  [440 mL] 440 mL PEEP:  [5 cmH20] 5 cmH20 Plateau Pressure:  [9 cmH20] 9 cmH20  No intake or output data in the 24 hours ending 08/26/19 0148 There were no vitals filed for this visit.  Examination: General: Elderly appearing, frail, female, intubated, sedated NAD  HENT: . Pink mmm. Poor dentition. Anicteric sclera.  Lungs: CTA bilaterally. Symmetrical chest expansion. No accessory use on MV.  Cardiovascular: IRIR s1s2. Cap refill 3 seconds.  Abdomen: Soft, flat, ndnt. Hypoactive bowel sounds.  Extremities: BLE 1+pitting edema. Symmetrical BUE bulk and tone. No obvious joint deformity. Neuro: Lightly sedated, is not following commands. Localizes to noxious stimuli. Pinpoint pupils  GU: Foley Skin: Pale. Scattered ecchymosis of various healing stages over body.   Resolved Hospital Problem list     Assessment & Plan:   Acute Encephalopathy Subacute vs acute infarct  Hypoxia on presentation P Follow up MRI-- pending results, consult neurology for formal evaluation  Continue mechanical ventilation Check LFTs, ammonia  Minimize sedation as able   Acute respiratory failure with hypoxia -bilateral pleural effusion, bilateral atelectasis vs infiltrate P Continue mechanical vent CXR in AM VAP  PAD Titrate PEEP/FiO2 for SpO2 > 92% Send tracheal aspirate, RVP   Leukocytosis -possible infection vs possible DVT (recent surgery, stopped Eliquis, waning mobility at home) P Broad spectrum abx Follow up micro data Trend WBC/temp Start Bair Hugger BLE Venous duplex   Hypotension -post intubation related P Wean NE off  Continue IVF  CAD s/p  CABG CHFrEF  Afib  HTN Hx  Home medication compliance unknown at this time P Continue tele Clarify home medications  At this time, hold antihypertensives   Hyperglycemia P SSI   At risk electrolyte disturbance P BMP pending, follow up and correct electrolyte abnormalities PRN   Best practice:  Diet: NPO Pain/Anxiety/Delirium protocol (if indicated): Fentanyl VAP protocol (if indicated): Yes  DVT prophylaxis: SCD, Heparin  GI prophylaxis: PPI Glucose control: SSI  Mobility: BR Code Status: Limited-- DNR however intubation ok Family Communication: Daughters updated at bedside  Disposition: Admit to ICU   Labs   CBC: Recent Labs  Lab 08/26/19 0010 08/26/19 0011  WBC  --  19.8*  NEUTROABS  --  16.1*  HGB 12.6 11.7*  HCT 37.0 39.3  MCV  --  75.3*  PLT  --  464*    Basic Metabolic Panel: Recent Labs  Lab 08/26/19 0010  NA 122*  K 5.0   GFR: CrCl cannot be calculated (Patient's most recent lab result is older than the maximum 21 days allowed.). Recent Labs  Lab 08/26/19 0011  WBC 19.8*  LATICACIDVEN 2.5*    Liver Function Tests: No results for input(s): AST, ALT, ALKPHOS, BILITOT, PROT, ALBUMIN in the last 168 hours. No results for input(s): LIPASE, AMYLASE in the last 168 hours. No results for input(s): AMMONIA in the last 168 hours.  ABG    Component Value Date/Time   PHART 7.284 (L) 08/26/2019 0010   PCO2ART 51.7 (H) 08/26/2019 0010   PO2ART 133.0 (H) 08/26/2019 0010   HCO3 24.5 08/26/2019 0010   TCO2 26 08/26/2019 0010   ACIDBASEDEF 3.0 (H) 08/26/2019 0010   O2SAT 99.0 08/26/2019 0010     Coagulation Profile: Recent Labs  Lab 08/26/19 0011  INR 1.1    Cardiac Enzymes: No results for input(s): CKTOTAL, CKMB, CKMBINDEX, TROPONINI in the last 168 hours.  HbA1C: Hgb A1c MFr Bld  Date/Time Value Ref Range Status  07/16/2019 03:48 AM 5.5 4.8 - 5.6 % Final    Comment:    (NOTE)         Prediabetes: 5.7 - 6.4         Diabetes: >6.4          Glycemic control for adults with diabetes: <7.0     CBG: Recent Labs  Lab 08/29/2019 2358  GLUCAP 188*    Review of Systems:   Unable to obtain -- patient intubated, sedated   Past Medical History  She,  has a past medical history of CAD (coronary artery disease), Heart failure with reduced ejection fraction ([place]), Hypertension, and Rheumatoid arthritis ([place]).   Surgical History    Past Surgical History:  Procedure Laterality Date  . CORONARY ARTERY BYPASS GRAFT N/A 07/19/2019   Procedure: CORONARY ARTERY BYPASS GRAFTING (CABG) x 2; Using Left Internal Mammary Artery (LIMA) and Right Leg Greater Saphenous Vein (SVG) harvested endoscopically; LIMA to LAD, SVG to Circ.;  Surgeon: Wonda Olds, MD;  Location: [place];  Service: Open Heart Surgery;  Laterality: N/A;  . RIGHT/LEFT HEART CATH AND CORONARY ANGIOGRAPHY N/A 07/17/2019   Procedure: RIGHT/LEFT HEART CATH AND CORONARY ANGIOGRAPHY;  Surgeon: Iran Ouch, MD;  Location: [facility] INVASIVE CV LAB;  Service: Cardiovascular;  Laterality: N/A;  . TEE WITHOUT CARDIOVERSION N/A 07/19/2019   Procedure: TRANSESOPHAGEAL ECHOCARDIOGRAM (TEE);  Surgeon: Linden Dolin, MD;  Location: [facility];  Service: Open Heart Surgery;  Laterality: N/A;  . TONSILLECTOMY       Social History   reports that she has never smoked. She has never used smokeless tobacco. She reports previous alcohol use. She reports previous drug use.   Family History   Her family history is not on file.   Allergies No Known Allergies   Home Medications  Prior to Admission medications   Medication Sig Start Date End Date Taking? Authorizing Provider  acetaminophen (TYLENOL) 500 MG tablet May take 1 tablet (500 mg total) by mouth every 6 (six) hours as needed for mild pain. May also take 1 tablet (500 mg total) every 6 (six) hours as needed for mild pain. 07/24/19   Gold, Wayne E, PA-C  acidophilus (RISAQUAD) CAPS capsule Take 1 capsule by mouth daily.    [provider]  amiodarone (PACERONE) 200 MG tablet Take 1 tablet (200 mg total) by mouth daily. 08/03/19   Bensimhon, Bevelyn Buckles, MD  apixaban (ELIQUIS) 5 MG TABS tablet Take 1 tablet (5 mg total) by mouth 2 (two) times daily. 07/24/19   Gold, Glenice Laine, PA-C  aspirin EC 81 MG EC tablet Take 1 tablet (81 mg total) by mouth daily. 07/24/19   Gold, Wayne E, PA-C  atorvastatin (LIPITOR) 40 MG tablet Take 1 tablet (40 mg total) by mouth daily at 6 PM. 07/17/19   Katha Hamming, MD  Coenzyme Q10-Vitamin E (QUNOL ULTRA COQ10 PO) Take by mouth.    [provider]  furosemide (LASIX) 40 MG tablet Take 1 tablet (40 mg total) by mouth 2 (two) times daily. Patient taking differently: Take 20 mg by mouth 2 (two) times daily.  07/28/19 07/27/20  Linden Dolin, MD  L-Lysine 500 MG CAPS Take by mouth.    [provider]  metoprolol tartrate (LOPRESSOR) 25 MG tablet Take 0.5 tablets (12.5 mg total) by mouth 2 (two) times daily. 07/24/19   Gold, Deniece Portela E, PA-C  potassium chloride (K-DUR) 10 MEQ tablet Take 1 tablet (10 mEq total) by mouth 2 (two) times daily. 07/28/19   Linden Dolin, MD  spironolactone (ALDACTONE) 25 MG tablet Take 1 tablet (25 mg total) by mouth daily. 08/03/19   Bensimhon, Bevelyn Buckles, MD  temazepam (RESTORIL) 15 MG capsule Take 1 capsule (15 mg total) by mouth at bedtime as needed for sleep. 08/18/19   Linden Dolin, MD  traMADol Janean Sark) 50 MG tablet 1/2 - 1 tablet q 6-8 hours prn pain 08/07/19   Tommi Rumps, PA-C     Critical care time: 45 min       Tessie Fass MSN, AGACNP-BC [facility] Pulmonary/Critical Care Medicine [phone] If no answer, [phone] 08/26/2019, 2:30 AM

## 2019-08-26 NOTE — Progress Notes (Signed)
Bilateral lower extremity venous duplex completed. Refer to "CV Proc" under chart review to view preliminary results.  08/26/2019 10:36 AM Maudry Mayhew, MHA, RVT, RDCS, RDMS

## 2019-08-26 NOTE — Progress Notes (Signed)
MD made aware of increased Unifocal PVCs. Pt still on 32mcg of Levophed. Verbal order for one 25% 25gm Albumin to be given. Medication ordered by this RN. Will continue to monitor.

## 2019-08-26 NOTE — Plan of Care (Signed)
  Problem: Elimination: Goal: Will not experience complications related to urinary retention Outcome: Progressing Note: Although patient is making urine, the output is starting to decrease. MD aware and order for fluids put in   Problem: Pain Managment: Goal: General experience of comfort will improve Outcome: Progressing   Problem: Safety: Goal: Ability to remain free from injury will improve Outcome: Progressing   Problem: Respiratory: Goal: Ability to maintain a clear airway and adequate ventilation will improve Outcome: Progressing   Problem: Nutrition: Goal: Adequate nutrition will be maintained Outcome: Not Progressing Note: Pt not currently on tube feeds. Will consult with MD.

## 2019-08-26 NOTE — ED Notes (Signed)
Pt's dentures and watch given to family.

## 2019-08-26 NOTE — Progress Notes (Signed)
PCCM FOLLOW UP    At bedside with RN, Assessing patient off sedation Pt wakes up to tactile stimulation and follows commands, opens eyes. Requiring Levo gtt @ 1 mcg Stopped Normal Saline, continue with LR at 75 cc/hr Going to MRI now, will f/u imaging  .Marland KitchenSigned Dr Seward Carol Pulmonary Critical Care Locums

## 2019-08-26 NOTE — Progress Notes (Signed)
  Echocardiogram 2D Echocardiogram has been performed.  Bobbye Charleston 08/26/2019, 10:38 AM

## 2019-08-26 NOTE — ED Provider Notes (Signed)
TIME SEEN: 12:16 AM  CHIEF COMPLAINT: Altered mental status, hypoxia  HPI: Patient is an 85 year old female with history of CAD status post CABG, CHF, hypertension, rheumatoid arthritis who presents to the emergency department with altered mental status, hypoxia.  History is obtained from patient's daughters and EMS.  Patient's daughters report the patient was having a hard time getting up and walking today.  After dinner they had a hard time getting her up.  There were no falls or head injuries.  They state prior to them calling EMS patient stopped answering questions and became less responsive which they states she has done before.  They noticed however that her lips appeared blue and she appeared to be breathing rapidly.  EMS was called.  On EMS arrival, sats were in the 30s on room air.  Patient initially able to mouth words and follow some commands but now will only open eyes spontaneously.  Blood sugar in the 270s with EMS.  Placed on nonrebreather by EMS.  Daughters report patient is a DNR but would want intubation.  Daughters have power of attorney.  Family states patient is no longer on Eliquis.  ROS: Level 5 caveat for altered mental status  PAST MEDICAL HISTORY/PAST SURGICAL HISTORY:  Past Medical History:  Diagnosis Date  . CAD (coronary artery disease)    9/14 LHC with EF 25-35%, mod elevated LVEDP, dLM 90%, pLCx80%, pLAD to mLAD 90%, 1st diag 80%, mid Cx 100%, pRCA 90%, pRCA to mRCA 100%  . Heart failure with reduced ejection fraction ([place])    a) 9/13 echo with EF 30-35%, mild LVF, diffuse hypokinesis, mod aortic calcification, mild AR  . Hypertension   . Rheumatoid arthritis ([place])     MEDICATIONS:  Prior to Admission medications   Medication Sig Start Date End Date Taking? Authorizing Provider  acetaminophen (TYLENOL) 500 MG tablet May take 1 tablet (500 mg total) by mouth every 6 (six) hours as needed for mild pain. May also take 1 tablet (500 mg total) every 6 (six) hours  as needed for mild pain. 07/24/19   Gold, Wayne E, PA-C  acidophilus (RISAQUAD) CAPS capsule Take 1 capsule by mouth daily.    [provider]  amiodarone (PACERONE) 200 MG tablet Take 1 tablet (200 mg total) by mouth daily. 08/03/19   Bensimhon, Shaune Pascal, MD  apixaban (ELIQUIS) 5 MG TABS tablet Take 1 tablet (5 mg total) by mouth 2 (two) times daily. 07/24/19   Gold, Wilder Glade, PA-C  aspirin EC 81 MG EC tablet Take 1 tablet (81 mg total) by mouth daily. 07/24/19   Gold, Wayne E, PA-C  atorvastatin (LIPITOR) 40 MG tablet Take 1 tablet (40 mg total) by mouth daily at 6 PM. 07/17/19   Epifanio Lesches, MD  Coenzyme Q10-Vitamin E (QUNOL ULTRA COQ10 PO) Take by mouth.    [provider]  furosemide (LASIX) 40 MG tablet Take 1 tablet (40 mg total) by mouth 2 (two) times daily. Patient taking differently: Take 20 mg by mouth 2 (two) times daily.  07/28/19 07/27/20  Wonda Olds, MD  L-Lysine 500 MG CAPS Take by mouth.    [provider]  metoprolol tartrate (LOPRESSOR) 25 MG tablet Take 0.5 tablets (12.5 mg total) by mouth 2 (two) times daily. 07/24/19   Gold, Patrick Jupiter E, PA-C  potassium chloride (K-DUR) 10 MEQ tablet Take 1 tablet (10 mEq total) by mouth 2 (two) times daily. 07/28/19   Wonda Olds, MD  spironolactone (ALDACTONE) 25 MG tablet  Take 1 tablet (25 mg total) by mouth daily. 08/03/19   Bensimhon, Bevelyn Buckles, MD  temazepam (RESTORIL) 15 MG capsule Take 1 capsule (15 mg total) by mouth at bedtime as needed for sleep. 08/18/19   Linden Dolin, MD  traMADol (ULTRAM) 50 MG tablet 1/2 - 1 tablet q 6-8 hours prn pain 08/07/19   Tommi Rumps, PA-C    ALLERGIES:  No Known Allergies  SOCIAL HISTORY:  Social History   Tobacco Use  . Smoking status: Never Smoker  . Smokeless tobacco: Never Used  Substance Use Topics  . Alcohol use: Not Currently    Frequency: Never    FAMILY HISTORY: No family history on file.  EXAM: BP (!) 178/100 (BP Location: Left Arm)    Pulse (!) 127   Temp 97.8 F (36.6 C) (Rectal)   Resp (!) 38   SpO2 98%  CONSTITUTIONAL: Elderly, thin, chronically ill-appearing, opens eyes spontaneously and will withdraw from pain but does not follow commands or answer questions, GCS 9, patient appears pale HEAD: Normocephalic, atraumatic  EYES: Conjunctivae clear, pupils appear equal, EOMI, pale conjunctive a ENT: normal nose; moist mucous membranes NECK: Supple, no meningismus, no nuchal rigidity, no LAD  CARD: Irregularly irregular and tachycardic; S1 and S2 appreciated; no murmurs, no clicks, no rubs, no gallops RESP: Tachypneic in the 40s to 50s; currently on a nonrebreather satting 98%, increased work of breathing, and able to answer questions ABD/GI: Abdomen is soft and appears to be nontender BACK:  The back appears normal EXT: Normal ROM in all joints; non-tender to palpation; no edema; normal capillary refill; no cyanosis, no calf tenderness or swelling    SKIN: extremities are very cool to touch and appear pale NEURO: patient keeps eyes open spontaneously and will withdraw upper extremities to painful stimuli but does not answer questions or follow commands  MEDICAL DECISION MAKING: Patient here with altered mental status and respiratory failure with oxygen.  ABG shows respiratory acidosis secondary to hypercapnia.  Recently underwent CABG 07/19/2019 with Dr. Vickey Sages.  I am concerned for possible sepsis today, intracranial hemorrhage, stroke.  Chest x-ray shows bilateral pleural effusions but no pneumothorax.  Differential also includes ACS, PE.  Discussed case with patient's daughters by phone.  They state that patient is a DNR but would want intubation.  Patient intubated due to altered mental status for airway protection and also for respiratory failure with hypoxia and hypercapnia.  Covid swab sent.  ED PROGRESS: Labs show leukocytosis of 19,000 with left shift.  Lactate elevated.  Chest x-ray concerning for bilateral  consolidations.  Suspect pneumonia.  Rectal temp 97.8.  Will give broad-spectrum antibiotics.  She has received IV hydration and we are monitoring this closely given she has an EF of 25 to 30%.  Patient was initially hypertensive but after intubation has had some brief episodes of hypotension and has required very low doses of IV Levophed.   patient's Covid is negative.  Hemoglobin is 11.  Hemoccult negative.  CT of the head shows acute versus subacute hypodensities in the bilateral frontal lobes.  Concern for stroke versus embolic event.  She does have history of A. fib and is not currently on Eliquis per family.  Patient sedated using IV fentanyl.  Still minimal neurologic response.   1:32 AM  Discussed with E link physician.  Critical care will see patient for admission.  1:42 AM  Discussed with Dr. Wilford Corner with neurology who has reviewed patient's imaging.  He feels that these  areas do not appear acute within the last several hours.  Agrees patient would not have been a TPA candidate.  Recommends MRI of the brain without contrast and if there are areas of an acute stroke, would like CCM to repage for formal consultation.   2:20 AM  D/w Tessie Fass, NP with critical care who has seen the patient.  Have offered to place central line but will hold at this time given patient is on a very low-dose of IV Levophed.  We will monitor this closely.  Daughter is at bedside and have been updated.  They confirm again that patient is a DNR and would not want chest compressions, defibrillation.  They are fine with her still staying on a ventilator currently and if needed having a central line placed and being on vasopressors.  I reviewed all nursing notes and pertinent previous records as available.  I have interpreted any EKGs, lab and urine results, imaging (as available).   EKG Interpretation  Date/Time:  Saturday August 26 2019 00:18:34 EDT Ventricular Rate:  129 PR Interval:    QRS Duration: 95 QT  Interval:  335 QTC Calculation: 491 R Axis:   45 Text Interpretation:  Atrial flutter with varied AV block, Abnormal T, consider ischemia, diffuse leads Confirmed by Rochele Raring [phone]) on 08/26/2019 12:29:48 AM       CRITICAL CARE Performed by: Baxter Hire Cane Dubray   Total critical care time: 65 minutes  Critical care time was exclusive of separately billable procedures and treating other patients.  Critical care was necessary to treat or prevent imminent or life-threatening deterioration.  Critical care was time spent personally by me on the following activities: development of treatment plan with patient and/or surrogate as well as nursing, discussions with consultants, evaluation of patient's response to treatment, examination of patient, obtaining history from patient or surrogate, ordering and performing treatments and interventions, ordering and review of laboratory studies, ordering and review of radiographic studies, pulse oximetry and re-evaluation of patient's condition.  Procedure Name: Intubation Date/Time: 08/26/2019 1:00 AM Performed by: Frieda Arnall, Layla Maw, DO Pre-anesthesia Checklist: Patient identified, Patient being monitored, Emergency Drugs available, Timeout performed and Suction available Oxygen Delivery Method: Non-rebreather mask Preoxygenation: Pre-oxygenation with 100% oxygen Induction Type: Rapid sequence Ventilation: Mask ventilation without difficulty Laryngoscope Size: Glidescope Grade View: Grade II Tube size: 7.5 mm Number of attempts: 1 Placement Confirmation: ETT inserted through vocal cords under direct vision,  CO2 detector and Breath sounds checked- equal and bilateral Secured at: 22 cm Tube secured with: ETT holder        NASHAY BRICKLEY was evaluated in Emergency Department on 08/26/2019 for the symptoms described in the history of present illness. She was evaluated in the context of the global COVID-19 pandemic, which necessitated consideration  that the patient might be at risk for infection with the SARS-CoV-2 virus that causes COVID-19. Institutional protocols and algorithms that pertain to the evaluation of patients at risk for COVID-19 are in a state of rapid change based on information released by regulatory bodies including the CDC and federal and state organizations. These policies and algorithms were followed during the patient's care in the ED.    Aerie Donica, Layla Maw, DO 08/26/19 0222

## 2019-08-26 NOTE — Progress Notes (Signed)
NAME:  Lacey GaulBetty J Coleman, MRN:  [id number], DOB:  08/16/1931, LOS: 0 ADMISSION DATE:  06-Dec-2018, CONSULTATION DATE: 08/26/2019 REFERRING MD: Dr. Elesa MassedWard, CHIEF COMPLAINT: Altered mental status  Brief History   85 year old lady found unresponsive at home, upon arrival by EMS, intubated.  History of present illness   History obtained from chart  85 yo F PMH CAD s/p CABG, CHF, HTN, RA, A fib (eliquis dc 1 week ago) who presents 10/23 with AMS. Patient was found unresponsive at home in chair, found by family. Upon EMS arrival, SpO2 30%.  EMS reported ability to follow commands en route after being placed on NRB; EMS reports degree of aphagia. In ED, patient not following commands, and is subsequently intubated due to hypoxia. Concern for CVA due to AMS; CT  H obtained and is concerning for acute vs subacute infarct. Neurology informally consulted by EDP and will see if MRI brain reveals acute infarct. Patient determined not to be candidate for tPA.   Per patient's daughter, patient experienced a fall during hospitalization approx 1 month ago and has been weak since discharge home.   Lactic acid 2.5, WBC 19.8, ABG 7.28/51/133. BMP pendng  COVID-19 neg Started of vanc and cefepime in ED   Past Medical History   Past Medical History:  Diagnosis Date  . CAD (coronary artery disease)    9/14 LHC with EF 25-35%, mod elevated LVEDP, dLM 90%, pLCx80%, pLAD to mLAD 90%, 1st diag 80%, mid Cx 100%, pRCA 90%, pRCA to mRCA 100%  . Heart failure with reduced ejection fraction ([facility])    a) 9/13 echo with EF 30-35%, mild LVF, diffuse hypokinesis, mod aortic calcification, mild AR  . Hypertension   . Rheumatoid arthritis ([facility])      Significant Hospital Events   Altered mental status Intubated for airway protectio CT reveals acute versus subacute infarct Status post MRI this morning  Consults:  Neurology  Procedures:  10/24 endotracheal tube>>  Significant Diagnostic Tests:  10/24 CXR> bilateral  pleural effusions, bibasilar atelectasis vs infiltrate   10/24 CT H non con> 1. No acute intracranial hemorrhage. 2. Mild age-related atrophy and chronic microvascular ischemic changes. 3. Bilateral frontal white matter hypodense lesions, new since theprior CT of 08/07/2019 and concerning for areas of subacute or acute infarct. Clinical correlation is recommended. MRI may provide betterevaluation if clinically indicated.  10/24 MRI brain IMPRESSION: 1. 18 mm focus of diffusion abnormality involving the subcortical left frontal lobe, most consistent with evolving late subacute ischemic infarct. No associated hemorrhage or mass effect. 2. No other acute intracranial abnormality. 3. Underlying atrophy with mild chronic microvascular ischemic disease.  Micro Data:  10/24 SARS CoV2> neg   Antimicrobials:  10/24 vanc> 10/24 cefepime>   Interim history/subjective:  Was interactive off sedation this morning  Objective   Blood pressure (!) 122/108, pulse 91, temperature (!) 96.6 F (35.9 C), temperature source Bladder, resp. rate 16, height 5\' 4"  (1.626 m), weight 63.6 kg, SpO2 100 %.    Vent Mode: PRVC FiO2 (%):  [80 %-100 %] 80 % Set Rate:  [16 bmp-20 bmp] 20 bmp Vt Set:  [440 mL] 440 mL PEEP:  [5 cmH20] 5 cmH20 Plateau Pressure:  [9 cmH20-22 cmH20] 22 cmH20   Intake/Output Summary (Last 24 hours) at 08/26/2019 0843 Last data filed at 08/26/2019 0700 Gross per 24 hour  Intake 900.79 ml  Output 295 ml  Net 605.79 ml   Filed Weights   08/26/19 0345  Weight: 63.6 kg  Examination: General: Elderly lady, frail, intubated HENT: Moist oral mucosa, pupils reactive Lungs: Clear breath sounds bilaterally, poor air movement Cardiovascular: S1-S2 appreciated Abdomen: Bowel sounds appreciated Extremities: Edema Neuro: Not following commands, did not withdraw to pain when I saw her GU: Foley in place, good output  Resolved Hospital Problem list    Assessment & Plan:   Encephalopathy Secondary to CVA as shown on MRI -Neurology following -Patient not a candidate for TPA -Continue support  Acute hypoxemic respiratory failure -This may be secondary to pneumonia -We will continue antibiotics at present -Monitor chest x-ray -Continue ventilator support -Pulmonary toileting  Sepsis Leukocytosis with fevers Secondary to pneumonia -We will continue antibiotics-continue cefepime -Follow cultures  Heart failure with reduced ejection fraction -Last echocardiogram with ejection fraction 25 to 30% -Requiring pressors  Started having some SVT/ectopies -We will try and wean off pressors -Fluid resuscitation -Albumin  Follow arterial blood gas  Discussed with daughters- 2 of them, power of attorney at bedside  Best practice:  Diet: Start tube feed Pain/Anxiety/Delirium protocol (if indicated): Fentanyl VAP protocol (if indicated): In place DVT prophylaxis: Heparin GI prophylaxis: [place] Glucose control: On insulin drip Mobility: Bedrest Code Status:#.  Family Communication: Discussed with family at bedside Disposition:   Labs   CBC: Recent Labs  Lab 08/26/19 0010 08/26/19 0011 08/26/19 0441  WBC  --  19.8* 18.4*  NEUTROABS  --  16.1*  --   HGB 12.6 11.7* 9.9*  HCT 37.0 39.3 32.2*  MCV  --  75.3* 72.7*  PLT  --  464* 623    Basic Metabolic Panel: Recent Labs  Lab 08/26/19 0010 08/26/19 0154 08/26/19 0441  NA 122* 125*  --   K 5.0 5.1  --   CL  --  96*  --   CO2  --  16*  --   GLUCOSE  --  144*  --   BUN  --  15  --   CREATININE  --  0.62  --   CALCIUM  --  9.0  --   MG  --  1.8 1.8  PHOS  --  4.8* 4.0   GFR: Estimated Creatinine Clearance: 43.6 mL/min (by C-G formula based on SCr of 0.62 mg/dL). Recent Labs  Lab 08/26/19 0011 08/26/19 0154 08/26/19 0441  PROCALCITON  --  <0.10  --   WBC 19.8*  --  18.4*  LATICACIDVEN 2.5*  --  2.3*    Liver Function Tests: Recent Labs  Lab 08/26/19 0441  AST 28  ALT 21   ALKPHOS 110  BILITOT 1.5*  PROT 4.8*  ALBUMIN 2.5*   No results for input(s): LIPASE, AMYLASE in the last 168 hours. Recent Labs  Lab 08/26/19 0441  AMMONIA 15    ABG    Component Value Date/Time   PHART 7.284 (L) 08/26/2019 0010   PCO2ART 51.7 (H) 08/26/2019 0010   PO2ART 133.0 (H) 08/26/2019 0010   HCO3 24.5 08/26/2019 0010   TCO2 26 08/26/2019 0010   ACIDBASEDEF 3.0 (H) 08/26/2019 0010   O2SAT 99.0 08/26/2019 0010     Coagulation Profile: Recent Labs  Lab 08/26/19 0011  INR 1.1    Cardiac Enzymes: No results for input(s): CKTOTAL, CKMB, CKMBINDEX, TROPONINI in the last 168 hours.  HbA1C: Hgb A1c MFr Bld  Date/Time Value Ref Range Status  08/26/2019 04:41 AM 5.5 4.8 - 5.6 % Final    Comment:    (NOTE) Pre diabetes:          5.7%-6.4% Diabetes:              >  6.4% Glycemic control for   <7.0% adults with diabetes   07/16/2019 03:48 AM 5.5 4.8 - 5.6 % Final    Comment:    (NOTE)         Prediabetes: 5.7 - 6.4         Diabetes: >6.4         Glycemic control for adults with diabetes: <7.0     CBG: Recent Labs  Lab 2019/09/19 2358 08/26/19 0344  GLUCAP 188* 141*     Past Medical History  She,  has a past medical history of CAD (coronary artery disease), Heart failure with reduced ejection fraction (HCC), Hypertension, and Rheumatoid arthritis ([facility]).   Surgical History    Past Surgical History:  Procedure Laterality Date  . CORONARY ARTERY BYPASS GRAFT N/A 07/19/2019   Procedure: CORONARY ARTERY BYPASS GRAFTING (CABG) x 2; Using Left Internal Mammary Artery (LIMA) and Right Leg Greater Saphenous Vein (SVG) harvested endoscopically; LIMA to LAD, SVG to Circ.;  Surgeon: Linden Dolin, MD;  Location: [facility] OR;  Service: Open Heart Surgery;  Laterality: N/A;  . RIGHT/LEFT HEART CATH AND CORONARY ANGIOGRAPHY N/A 07/17/2019   Procedure: RIGHT/LEFT HEART CATH AND CORONARY ANGIOGRAPHY;  Surgeon: Iran Ouch, MD;  Location: [facility] INVASIVE CV LAB;   Service: Cardiovascular;  Laterality: N/A;  . TEE WITHOUT CARDIOVERSION N/A 07/19/2019   Procedure: TRANSESOPHAGEAL ECHOCARDIOGRAM (TEE);  Surgeon: Linden Dolin, MD;  Location: [facility] OR;  Service: Open Heart Surgery;  Laterality: N/A;  . TONSILLECTOMY       Social History   reports that she has never smoked. She has never used smokeless tobacco. She reports previous alcohol use. She reports previous drug use.   Family History   Her family history is not on file.   Allergies No Known Allergies   The patient is critically ill with multiple organ systems failure and requires high complexity decision making for assessment and support, frequent evaluation and titration of therapies, application of advanced monitoring technologies and extensive interpretation of multiple databases. Critical Care Time devoted to patient care services described in this note independent of APP/resident time (if applicable)  is 35 minutes.   Virl Diamond MD [facility] Personal pager: [phone] If unanswered, please page CCM On-call: #[phone]

## 2019-08-26 NOTE — ED Notes (Signed)
Bair hugger applied.

## 2019-08-26 NOTE — Progress Notes (Signed)
eLink Physician-Brief Progress Note Patient Name: Lacey Coleman DOB: 1930-11-30 MRN: [id number]   Date of Service  08/26/2019  HPI/Events of Note  32 Fs/p CABG last month presented with AMS amd hypoxia. No intubated. CXR with bilateral pleural effusion with hazy density. Head CT with possible cardio embolic infarct. Neurology consulted and assessed it not to be acute, for MRI. WBC 20, in shock, started on antibiotics.  eICU Interventions   Continue antibiotics for pneumonia  Follow-up MRI     Intervention Category Major Interventions: Respiratory failure - evaluation and management;Hypotension - evaluation and management;Change in mental status - evaluation and management;Infection - evaluation and management Evaluation Type: New Patient Evaluation  Judd Lien 08/26/2019, 4:32 AM

## 2019-08-26 NOTE — Progress Notes (Signed)
Called by ED provider regarding possible new area of stroke on head CT today compared to a head CT from nearly 3 weeks ago. Due to unclear last known normal, not a candidate for IV TPA or EVT. Patient presented critically sick with pneumonia, hypoxia and respiratory failure. Recommended MRI brain-please recall if positive for stroke.  Called and discussed with the PCCM Dr. In the box  -- Amie Portland, MD [facility] Pager: [phone] If 7pm to 7am, please call on call as listed on AMION.

## 2019-08-26 NOTE — Progress Notes (Signed)
Initial Nutrition Assessment  RD working remotely.   DOCUMENTATION CODES:   Not applicable  INTERVENTION:  - if patient to remain intubated >/= 24 hours, recommend initiation of TF. - recommend Vital High Protein @ 45 ml/hr which will provide 1080 kcal (96% estimated kcal need), 94 grams protein, and 903 ml free water. - free water flush, if desired, to be per MD/NP given hyponatremia.    NUTRITION DIAGNOSIS:   Inadequate oral intake related to inability to eat as evidenced by NPO status.  GOAL:   Patient will meet greater than or equal to 90% of their needs  MONITOR:   Vent status, Labs, Weight trends, Skin  REASON FOR ASSESSMENT:   Ventilator  ASSESSMENT:   85 year old female with history of CAD s/p CABG, CHF, HTN, and rheumatoid arthritis. She presented to the ED on 08/26/19 with AMS and hypoxia. Her daughter reported that she was having a difficult time standing and ambulating on 10/23, specifically in the evening. No falls or head injuries. Daughter reported blue-appearing lips and patient was breathing rapidly. When EMS arrived, sats were in the 30s on room air and CBGs in the 270s. She was placed on NRB by EMS.  Patient was intubated in the ED earlier this AM. OGT now in place although it took several attempts by different nurses.   Per chart review, current weight is 140 lb, weight on 10/16 was 132 lb, weight on 10/1 and 10/5 was 137 lb, and weight on 9/21 was 143 lb. Will monitor weight trends. Noted that flow sheet documentation indicates deep pitting edema to BLE.   Per notes: - acute encephalopathy with working dx of CVA 2/2 thrombotic process; concern for subacute or acute infarct--Neuro following - acute respiratory failure with hypoxia--chronic bilateral pleural effusions with atelectasis, pulmonary embolus vs PNA - sepsis - hyperglycemia on admission - metabolic acidosis - hypotension--thought to be 2/2 sedation, goal MAP >65 (most recent MAP documented in  flow sheet was 115 at 0345)   Patient is currently intubated on ventilator support MV: 6.5 L/min Temp (24hrs), Avg:96.2 F (35.7 C), Min:95.5 F (35.3 C), Max:97.8 F (36.6 C) Propofol: none   Labs reviewed; CBGs: 142 and 92 mg/dl, Na: 125 mmol/l, Cl: 96 mmol/l. Medications reviewed; sliding scale novolog, IVF; LR @ 75 ml/hr. Drip; levo @ 2 mcg/min.    NUTRITION - FOCUSED PHYSICAL EXAM:  unable to complete at this time.   Diet Order:   Diet Order            Diet NPO time specified  Diet effective now              EDUCATION NEEDS:   No education needs have been identified at this time  Skin:  Skin Assessment: Skin Integrity Issues: Skin Integrity Issues:: DTI, Stage II DTI: R foot; bilateral ankles Stage II: coccyx; L IT  Last BM:  PTA/unknown  Height:   Ht Readings from Last 1 Encounters:  08/26/19 5\' 4"  (1.626 m)    Weight:   Wt Readings from Last 1 Encounters:  08/26/19 63.6 kg    Ideal Body Weight:  54.5 kg  BMI:  Body mass index is 24.07 kg/m.  Estimated Nutritional Needs:   Kcal:  1126 kcal  Protein:  76-95 grams  Fluid:  >/= 1.8 L/day      Jarome Matin, MS, RD, LDN, [facility] Inpatient Clinical Dietitian Pager # [phone] After hours/weekend pager # [phone]

## 2019-08-26 NOTE — ED Notes (Signed)
2 RNs attempted OG, unsuccessful.

## 2019-08-27 DIAGNOSIS — R4182 Altered mental status, unspecified: Secondary | ICD-10-CM

## 2019-08-27 DIAGNOSIS — J189 Pneumonia, unspecified organism: Secondary | ICD-10-CM

## 2019-08-27 DIAGNOSIS — Z951 Presence of aortocoronary bypass graft: Secondary | ICD-10-CM | POA: Diagnosis not present

## 2019-08-27 DIAGNOSIS — J9601 Acute respiratory failure with hypoxia: Secondary | ICD-10-CM | POA: Diagnosis not present

## 2019-08-27 DIAGNOSIS — I639 Cerebral infarction, unspecified: Secondary | ICD-10-CM

## 2019-08-27 LAB — GLUCOSE, CAPILLARY
Glucose-Capillary: 104 mg/dL — ABNORMAL HIGH (ref 70–99)
Glucose-Capillary: 110 mg/dL — ABNORMAL HIGH (ref 70–99)
Glucose-Capillary: 116 mg/dL — ABNORMAL HIGH (ref 70–99)
Glucose-Capillary: 117 mg/dL — ABNORMAL HIGH (ref 70–99)
Glucose-Capillary: 121 mg/dL — ABNORMAL HIGH (ref 70–99)
Glucose-Capillary: 121 mg/dL — ABNORMAL HIGH (ref 70–99)
Glucose-Capillary: 99 mg/dL (ref 70–99)

## 2019-08-27 MED ORDER — ASPIRIN 81 MG PO CHEW
81.0000 mg | CHEWABLE_TABLET | Freq: Every day | ORAL | Status: DC
  Administered 2019-08-27 – 2019-09-05 (×10): 81 mg
  Filled 2019-08-27 (×10): qty 1

## 2019-08-27 NOTE — Progress Notes (Signed)
Changed vent mode to PSV 10/5 40% patient tolerated well.

## 2019-08-27 NOTE — Progress Notes (Addendum)
NAME:  Lacey Coleman, MRN:  [id number], DOB:  [birth year], LOS: 1 ADMISSION DATE:  08/31/2019, CONSULTATION DATE: 08/26/2019 REFERRING MD: Dr. Leonides Schanz, CHIEF COMPLAINT: Altered mental status  Brief History   85 year old lady found unresponsive at home, upon arrival by EMS, intubated.  History of present illness   History obtained from chart  85 yo F PMH CAD s/p CABG, CHF, HTN, RA, A fib (eliquis dc 1 week ago) who presents 10/23 with AMS. Patient was found unresponsive at home in chair, found by family. Upon EMS arrival, SpO2 30%.  EMS reported ability to follow commands en route after being placed on NRB; EMS reports degree of aphagia. In ED, patient not following commands, and is subsequently intubated due to hypoxia. Concern for CVA due to AMS; CT  H obtained and is concerning for acute vs subacute infarct. Neurology informally consulted by EDP and will see if MRI brain reveals acute infarct. Patient determined not to be candidate for tPA.   Per patient's daughter, patient experienced a fall during hospitalization approx 1 month ago and has been weak since discharge home.   Lactic acid 2.5, WBC 19.8, ABG 7.28/51/133. BMP pendng  COVID-19 neg Started of vanc and cefepime in ED   Past Medical History   Past Medical History:  Diagnosis Date  . CAD (coronary artery disease)    9/14 LHC with EF 25-35%, mod elevated LVEDP, dLM 90%, pLCx80%, pLAD to mLAD 90%, 1st diag 80%, mid Cx 100%, pRCA 90%, pRCA to mRCA 100%  . Heart failure with reduced ejection fraction ([place])    a) 9/13 echo with EF 30-35%, mild LVF, diffuse hypokinesis, mod aortic calcification, mild AR  . Hypertension   . Rheumatoid arthritis ([place])      Significant Hospital Events   Altered mental status Intubated for airway protection CT reveals acute versus subacute infarct MRI did reveal a stroke-subacute  Consults:  Neurology  Procedures:  10/24 endotracheal tube>>  Significant Diagnostic Tests:  10/24 CXR>  bilateral pleural effusions, bibasilar atelectasis vs infiltrate   10/24 CT H non con> 1. No acute intracranial hemorrhage. 2. Mild age-related atrophy and chronic microvascular ischemic changes. 3. Bilateral frontal white matter hypodense lesions, new since theprior CT of 08/07/2019 and concerning for areas of subacute or acute infarct. Clinical correlation is recommended. MRI may provide betterevaluation if clinically indicated.  10/24 MRI brain IMPRESSION: 1. 18 mm focus of diffusion abnormality involving the subcortical left frontal lobe, most consistent with evolving late subacute ischemic infarct. No associated hemorrhage or mass effect. 2. No other acute intracranial abnormality. 3. Underlying atrophy with mild chronic microvascular ischemic disease.  Micro Data:  10/24 SARS CoV2> neg   Antimicrobials:  10/24 vanc>  10/24 cefepime>   Interim history/subjective:  Attempts to interact off sedation T-max of 100.8 Tolerating pressure support ventilation this morning  Objective   Blood pressure 95/71, pulse (!) 127, temperature 99.7 F (37.6 C), temperature source Core, resp. rate 16, height 5\' 4"  (1.626 m), weight 64.7 kg, SpO2 98 %.    Vent Mode: PSV FiO2 (%):  [40 %-70 %] 40 % Set Rate:  [18 bmp-20 bmp] 18 bmp Vt Set:  [440 mL] 440 mL PEEP:  [5 cmH20] 5 cmH20 Pressure Support:  [10 cmH20] 10 cmH20 Plateau Pressure:  [15 cmH20-18 cmH20] 18 cmH20   Intake/Output Summary (Last 24 hours) at 08/27/2019 1055 Last data filed at 08/27/2019 0700 Gross per 24 hour  Intake 2960.01 ml  Output 375 ml  Net 2585.01 ml  Filed Weights   08/26/19 0345 08/27/19 0333  Weight: 63.6 kg 64.7 kg    Examination: General: Elderly, frail, intubated HENT: Moist oral mucosa, pupils reactive Lungs: Decreased air movement, few rales at the bases Cardiovascular: S1-S2 appreciated Abdomen: Bowel sounds appreciated Extremities: Lower extremity edema Neuro: Moving extremities GU:  Foley in place, good output  Resolved Hospital Problem list    Assessment & Plan:   Encephalopathy 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 -Request neurology follow-up -Not a candidate for TPA -Continue support  Acute hypoxemic respiratory failure -May be secondary to pneumonia -Continue cefepime -Weaning with SBT as tolerated -Pulmonary toileting  Sepsis -Leukocytosis with fevers -Secondary to pneumonia -Continue cefepime -Follow cultures  Heart failure with reduced ejection fraction -Repeat echocardiogram shows improvement in ejection fraction, severe systolic dysfunction -Significant wall hypokinesis  Hypotension -Successfully weaned off pressors  Discussed with daughter at bedside  She does become tachycardic with intervention or during assessments Not an extubation candidate today Continue weaning as tolerated  Best practice:  Diet: Continue tube feeds Pain/Anxiety/Delirium protocol (if indicated): Fentanyl as needed VAP protocol (if indicated): In place DVT prophylaxis: Heparin GI prophylaxis: [place] Glucose control: Insulin Mobility: Bedrest Code Status: Partial code Family Communication: Discussed with daughter at bedside Disposition: icu  Labs   CBC: Recent Labs  Lab 08/26/19 0010 08/26/19 0011 08/26/19 0441 08/26/19 1305  WBC  --  19.8* 18.4*  --   NEUTROABS  --  16.1*  --   --   HGB 12.6 11.7* 9.9* 10.5*  HCT 37.0 39.3 32.2* 31.0*  MCV  --  75.3* 72.7*  --   PLT  --  464* 292  --     Basic Metabolic Panel: Recent Labs  Lab 08/26/19 0010 08/26/19 0154 08/26/19 0441 08/26/19 1305 08/26/19 1837  NA 122* 125*  --  126* 128*  K 5.0 5.1  --  4.2 4.4  CL  --  96*  --   --  99  CO2  --  16*  --   --  19*  GLUCOSE  --  144*  --   --  117*  BUN  --  15  --   --  14  CREATININE  --  0.62  --   --  0.69  CALCIUM  --  9.0  --   --  9.4  MG  --  1.8 1.8  --   --   PHOS  --  4.8* 4.0  --   --    GFR: Estimated Creatinine Clearance: 43.6 mL/min (by C-G  formula based on SCr of 0.69 mg/dL). Recent Labs  Lab 08/26/19 0011 08/26/19 0154 08/26/19 0441  PROCALCITON  --  <0.10  --   WBC 19.8*  --  18.4*  LATICACIDVEN 2.5*  --  2.3*    Liver Function Tests: Recent Labs  Lab 08/26/19 0441  AST 28  ALT 21  ALKPHOS 110  BILITOT 1.5*  PROT 4.8*  ALBUMIN 2.5*   No results for input(s): LIPASE, AMYLASE in the last 168 hours. Recent Labs  Lab 08/26/19 0441  AMMONIA 15    ABG    Component Value Date/Time   PHART 7.443 08/26/2019 1305   PCO2ART 29.5 (L) 08/26/2019 1305   PO2ART 213.0 (H) 08/26/2019 1305   HCO3 20.2 08/26/2019 1305   TCO2 21 (L) 08/26/2019 1305   ACIDBASEDEF 3.0 (H) 08/26/2019 1305   O2SAT 100.0 08/26/2019 1305      The patient is critically ill with multiple organ systems failure and  requires high complexity decision making for assessment and support, frequent evaluation and titration of therapies, application of advanced monitoring technologies and extensive interpretation of multiple databases. Critical Care Time devoted to patient care services described in this note independent of APP/resident time (if applicable)  is 35 minutes.   Virl DiamondAdewale Olalere MD [facility] Personal pager: [phone] If unanswered, please page CCM On-call: #[phone]

## 2019-08-27 NOTE — Progress Notes (Signed)
Changed vent back to PRVC due to fatigue and increase WOB, RR 45 BPM.

## 2019-08-27 NOTE — Consult Note (Addendum)
NEURO HOSPITALIST CONSULT NOTE   Requestig physician: Dr. Gilford Raid  Reason for Consult: New stroke seen on MRI  History obtained from:  Chart    HPI:                                                                                                                                          Lacey Coleman is an 85 y.o. female with PMHx of CAD, CABG, CHF (last ECHO 25-30% with diffuse hypokinesis), HTN, A-fib (previously on Eliquis but stopped after a fall on 10/5) and RA who presented to the ED on 10/23 after being found unresponsive in her chair at home by her family. Earier that day, she had been having a hard time getting up and walking. After dinner family had a hard time getting her up. She then stopped answering questions and became less responsive - this has happened before. However, they also noticed that her lips appeared blue and she was breathing rapidly. EMS was called. She had cyanotic lips on EMS arrival. After placement of NRB she was able to follow commands en route, but on arrival to the ED was no longer following commands and was tachypneic and tachycardic. Found to be hypoxic in the ED.   Due to encephalopathy, CT head was obtained, revealing bilateral frontal white matter hypodense lesions, new since the prior CT of 08/07/2019 and concerning for areas of subacute or acute infarct.   Neurology recommended an MRI, which revealed an 18 mm focus of diffusion abnormality involving the subcortical left frontal lobe, most consistent with evolving late subacute ischemic infarct.  Past Medical History:  Diagnosis Date  . CAD (coronary artery disease)    9/14 LHC with EF 25-35%, mod elevated LVEDP, dLM 90%, pLCx80%, pLAD to mLAD 90%, 1st diag 80%, mid Cx 100%, pRCA 90%, pRCA to mRCA 100%  . Heart failure with reduced ejection fraction ([place])    a) 9/13 echo with EF 30-35%, mild LVF, diffuse hypokinesis, mod aortic calcification, mild AR  . Hypertension   . Rheumatoid  arthritis [facility])     Past Surgical History:  Procedure Laterality Date  . CORONARY ARTERY BYPASS GRAFT N/A 07/19/2019   Procedure: CORONARY ARTERY BYPASS GRAFTING (CABG) x 2; Using Left Internal Mammary Artery (LIMA) and Right Leg Greater Saphenous Vein (SVG) harvested endoscopically; LIMA to LAD, SVG to Circ.;  Surgeon: Wonda Olds, MD;  Location: [place];  Service: Open Heart Surgery;  Laterality: N/A;  . RIGHT/LEFT HEART CATH AND CORONARY ANGIOGRAPHY N/A 07/17/2019   Procedure: RIGHT/LEFT HEART CATH AND CORONARY ANGIOGRAPHY;  Surgeon: Wellington Hampshire, MD;  Location: [facility];  Service: Cardiovascular;  Laterality: N/A;  . TEE WITHOUT CARDIOVERSION N/A 07/19/2019   Procedure: TRANSESOPHAGEAL ECHOCARDIOGRAM (TEE);  Surgeon: Wonda Olds,  MD;  Location: [facility];  Service: Open Heart Surgery;  Laterality: N/A;  . TONSILLECTOMY      No family history on file.            Social History:  reports that she has never smoked. She has never used smokeless tobacco. She reports previous alcohol use. She reports previous drug use.  No Known Allergies  MEDICATIONS:                                                                                                                     Scheduled: . chlorhexidine gluconate (MEDLINE KIT)  15 mL Mouth Rinse BID  . Chlorhexidine Gluconate Cloth  6 each Topical Daily  . etomidate  20 mg Intravenous Once  . feeding supplement (PRO-STAT SUGAR FREE 64)  30 mL Per Tube BID  . feeding supplement (VITAL HIGH PROTEIN)  1,000 mL Per Tube Q24H  . heparin  5,000 Units Subcutaneous Q8H  . insulin aspart  0-9 Units Subcutaneous Q4H  . mouth rinse  15 mL Mouth Rinse 10 times per day  . pantoprazole (PROTONIX) IV  40 mg Intravenous Daily  . succinylcholine  100 mg Intravenous Once   Continuous: . ceFEPime (MAXIPIME) IV Stopped (08/27/19 1055)  . fentaNYL infusion INTRAVENOUS 75 mcg/hr (08/27/19 0800)  . lactated ringers 75 mL/hr at 08/27/19 1200  .  norepinephrine (LEVOPHED) Adult infusion Stopped (08/26/19 2321)     ROS:                                                                                                                                       Unable to obtain due to AMS.   Blood pressure 95/71, pulse (!) 127, temperature 98.8 F (37.1 C), temperature source Core, resp. rate 16, height '5\' 4"'$  (1.626 m), weight 64.7 kg, SpO2 95 %.   General Examination:  Physical Exam  HEENT-  [place]/AT    Lungs- Intubated Extremities- Warm and well perfused  Neurological Examination Mental Status: Initially asleep, she opens eyes to an awake state but does not make eye contact or track visual stimuli. Follows about 10% of all verbal commands. No attempts to communicate. No evidence for hemineglect. No purposeful movements, but when aroused to awaken, appears startled and stereotypically rapidly elevates both arms by her head in a manner resembling a guarding posture, maintaining that posture for a few minutes.  Cranial Nerves: II: No reliable blink to threat. PERRL.  III,IV, VI: No ptosis. Does not track examiner or other objects spontaneously or to command. Eyes conjugate at the midline.  V,VII: Intubated with face grossly symmetric. Unable to formally test sensation.  VIII: hearing intact to voice IX,X: Intubated XI: No asymmetry XII: Intubated Motor/Sensory: When aroused to awaken, appears startled and stereotypically rapidly elevates both arms by her head in a manner resembling a guarding posture, maintaining arms antigravity for a few minutes. BLE move erratically to noxious but do not clearly withdraw.  No asymmetry in motor findings.  Deep Tendon Reflexes: 3+ bilateral brachioradialis and biceps. 2+ patellae bilaterally. Toes upgoing bilaterally  Cerebellar/Gait: Unable to assess   Lab Results: Basic Metabolic Panel: Recent Labs   Lab 08/26/19 0010 08/26/19 0154 08/26/19 0441 08/26/19 1305 08/26/19 1837  NA 122* 125*  --  126* 128*  K 5.0 5.1  --  4.2 4.4  CL  --  96*  --   --  99  CO2  --  16*  --   --  19*  GLUCOSE  --  144*  --   --  117*  BUN  --  15  --   --  14  CREATININE  --  0.62  --   --  0.69  CALCIUM  --  9.0  --   --  9.4  MG  --  1.8 1.8  --   --   PHOS  --  4.8* 4.0  --   --     CBC: Recent Labs  Lab 08/26/19 0010 08/26/19 0011 08/26/19 0441 08/26/19 1305  WBC  --  19.8* 18.4*  --   NEUTROABS  --  16.1*  --   --   HGB 12.6 11.7* 9.9* 10.5*  HCT 37.0 39.3 32.2* 31.0*  MCV  --  75.3* 72.7*  --   PLT  --  464* 292  --     Cardiac Enzymes: No results for input(s): CKTOTAL, CKMB, CKMBINDEX, TROPONINI in the last 168 hours.  Lipid Panel: No results for input(s): CHOL, TRIG, HDL, CHOLHDL, VLDL, LDLCALC in the last 168 hours.  Imaging: Ct Head Wo Contrast  Result Date: 08/26/2019 CLINICAL DATA:  85 year old female with altered mental status. EXAM: CT HEAD WITHOUT CONTRAST TECHNIQUE: Contiguous axial images were obtained from the base of the skull through the vertex without intravenous contrast. COMPARISON:  Head CT dated 08/07/2019 FINDINGS: Brain: There is mild age-related atrophy and chronic microvascular ischemic changes. Bilateral basal ganglial old lacunar infarcts noted. Left frontal white matter hypodense focus (series 3 image 23), right frontal small hypodensity (series 3, image 23 appear new since the prior CT of 08/07/2019 and concerning for areas of subacute or acute infarct. An embolic infarct is not excluded. Clinical correlation is recommended. MRI may provide better evaluation. There is no acute intracranial hemorrhage. No mass effect or midline shift. No extra-axial fluid collection. Vascular: No hyperdense vessel or unexpected calcification.  Skull: Normal. Negative for fracture or focal lesion. Sinuses/Orbits: No acute finding. Other: A support tube is partially visualized.  IMPRESSION: 1. No acute intracranial hemorrhage. 2. Mild age-related atrophy and chronic microvascular ischemic changes. 3. Bilateral frontal white matter hypodense lesions, new since the prior CT of 08/07/2019 and concerning for areas of subacute or acute infarct. Clinical correlation is recommended. MRI may provide better evaluation if clinically indicated. These results were called by telephone at the time of interpretation on 08/26/2019 at 1:28 am to provider [facility] , who verbally acknowledged these results. Electronically Signed   By: Anner Crete M.D.   On: 08/26/2019 01:32   Mr Brain Wo Contrast  Result Date: 08/26/2019 CLINICAL DATA:  Initial evaluation for acute unresponsiveness. EXAM: MRI HEAD WITHOUT CONTRAST TECHNIQUE: Multiplanar, multiecho pulse sequences of the brain and surrounding structures were obtained without intravenous contrast. COMPARISON:  Prior CT from earlier the same day. FINDINGS: Brain: Generalized age-related cerebral atrophy. Mild patchy T2/FLAIR hyperintensity within the periventricular deep white matter both cerebral hemispheres most consistent with chronic small vessel ischemic disease, mild in nature. Few scatter remote lacunar infarct present within the bilateral basal ganglia. 18 mm focus of diffusion abnormality involving the subcortical left frontal lobe most consistent with evolving late subacute ischemic infarct (series 5, image 78). No associated hemorrhage or mass effect. No other evidence for acute or subacute ischemia. Gray-white matter differentiation otherwise maintained with no other chronic or remote cortical infarction. No acute intracranial hemorrhage. Few scattered chronic micro hemorrhages noted. No mass lesion, midline shift or mass effect. No hydrocephalus. No extra-axial fluid collection. Pituitary gland within normal limits for suprasellar region normal. Midline structures intact. Vascular: Major intracranial vascular flow voids are maintained.  Skull and upper cervical spine: Craniocervical junction within normal limits. Multilevel degenerative spondylosis noted within the upper cervical spine with resultant mild diffuse spinal stenosis. Bone marrow signal intensity within normal limits. No scalp soft tissue abnormality. Sinuses/Orbits: Globes orbital soft tissues within normal limits. Paranasal sinuses are clear. No mastoid effusion. Inner ear structures grossly normal. Other: None. IMPRESSION: 1. 18 mm focus of diffusion abnormality involving the subcortical left frontal lobe, most consistent with evolving late subacute ischemic infarct. No associated hemorrhage or mass effect. 2. No other acute intracranial abnormality. 3. Underlying atrophy with mild chronic microvascular ischemic disease. Electronically Signed   By: Jeannine Boga M.D.   On: 08/26/2019 07:08   Dg Chest Port 1 View  Result Date: 08/26/2019 CLINICAL DATA:  Endotracheal tube EXAM: PORTABLE CHEST 1 VIEW COMPARISON:  08/26/2019, 12:35 a.m. FINDINGS: No significant change in AP portable examination with small to moderate bilateral pleural effusions and associated atelectasis or consolidation. Endotracheal tube remains in essentially unchanged position, tip approximately 3 cm above the carina. Esophagogastric tube with tip and side port below the diaphragm. Cardiomegaly status post median sternotomy. IMPRESSION: 1. No significant change in AP portable examination with small to moderate bilateral pleural effusions and associated atelectasis or consolidation. 2. Endotracheal tube remains in essentially unchanged position, tip approximately 3 cm above the carina. Esophagogastric tube with tip and side port below the diaphragm. Electronically Signed   By: Eddie Candle M.D.   On: 08/26/2019 13:18   Dg Chest Portable 1 View  Result Date: 08/26/2019 CLINICAL DATA:  85 year old female status post intubation. EXAM: PORTABLE CHEST 1 VIEW COMPARISON:  Earlier radiograph dated 08/26/2019  FINDINGS: Interval placement of an endotracheal tube with tip approximately 2.5 cm above the carina. Enteric tube extends below the diaphragm with tip  beyond the inferior margin of the image. Bilateral pleural effusions and associated bibasilar atelectasis although pneumonia is not excluded. No pneumothorax. Stable cardiac silhouette. Median sternotomy wires. No acute osseous pathology. IMPRESSION: 1. Interval placement of an endotracheal tube with tip above the carina. 2. Bilateral pleural effusions and associated bibasilar atelectasis/infiltrate. Electronically Signed   By: Anner Crete M.D.   On: 08/26/2019 00:45   Dg Chest Portable 1 View  Result Date: 08/26/2019 CLINICAL DATA:  Respiratory distress EXAM: PORTABLE CHEST 1 VIEW COMPARISON:  August 18, 2019 FINDINGS: There is mild cardiomegaly. Overlying median sternotomy wires. Aortic knob calcifications. Small to moderate bilateral pleural effusions are seen, slightly worsened on the prior exam. There is hazy airspace opacity at both lung bases which could be layering effusion and/or atelectasis. No acute osseous abnormality. IMPRESSION: Small to moderate bilateral pleural effusions with adjacent hazy airspace opacity which could be layering effusion and/or atelectasis. This is slightly increased in size from the prior exam. Electronically Signed   By: Prudencio Pair M.D.   On: 08/26/2019 00:32   Dg Abd Portable 1v  Result Date: 08/26/2019 CLINICAL DATA:  NG tube placement EXAM: PORTABLE ABDOMEN - 1 VIEW COMPARISON:  None. FINDINGS: Tip the NG tube is seen projecting over the mid body of the stomach. No dilated loops of bowel are seen. Small bilateral pleural effusions. IMPRESSION: Tip of NG tube seen projecting over the mid body of the stomach. Electronically Signed   By: Prudencio Pair M.D.   On: 08/26/2019 01:41   Vas [place] Lower Extremity Venous (dvt)  Result Date: 08/26/2019  Lower Venous Study Indications: Swelling.  Comparison Study: No prior  study. Performing Technologist: Maudry Mayhew MHA, RDMS, RVT, RDCS  Examination Guidelines: A complete evaluation includes B-mode imaging, spectral Doppler, color Doppler, and power Doppler as needed of all accessible portions of each vessel. Bilateral testing is considered an integral part of a complete examination. Limited examinations for reoccurring indications may be performed as noted.  +---------+---------------+---------+-----------+----------+--------------+ RIGHT    CompressibilityPhasicitySpontaneityPropertiesThrombus Aging +---------+---------------+---------+-----------+----------+--------------+ CFV                                                   Not visualized +---------+---------------+---------+-----------+----------+--------------+ SFJ                                                   Not visualized +---------+---------------+---------+-----------+----------+--------------+ FV Prox  Full                                                        +---------+---------------+---------+-----------+----------+--------------+ FV Mid   Full                                                        +---------+---------------+---------+-----------+----------+--------------+ FV DistalFull                                                        +---------+---------------+---------+-----------+----------+--------------+  PFV      Full                                                        +---------+---------------+---------+-----------+----------+--------------+ POP      Full           No       Yes                                 +---------+---------------+---------+-----------+----------+--------------+ PTV      Full                                                        +---------+---------------+---------+-----------+----------+--------------+ PERO     Full                                                         +---------+---------------+---------+-----------+----------+--------------+  +---------+---------------+---------+-----------+----------+--------------+ LEFT     CompressibilityPhasicitySpontaneityPropertiesThrombus Aging +---------+---------------+---------+-----------+----------+--------------+ CFV      Full           No       Yes                                 +---------+---------------+---------+-----------+----------+--------------+ SFJ      Full                                                        +---------+---------------+---------+-----------+----------+--------------+ FV Prox  Full                                                        +---------+---------------+---------+-----------+----------+--------------+ FV Mid   Full                                                        +---------+---------------+---------+-----------+----------+--------------+ FV DistalFull                                                        +---------+---------------+---------+-----------+----------+--------------+ PFV      Full                                                        +---------+---------------+---------+-----------+----------+--------------+  POP      Full           No       Yes                                 +---------+---------------+---------+-----------+----------+--------------+ PTV      Full                                                        +---------+---------------+---------+-----------+----------+--------------+ PERO     Full                                                        +---------+---------------+---------+-----------+----------+--------------+  Summary: Right: There is no evidence of deep vein thrombosis in the lower extremity. However, portions of this examination were limited- see technologist comments above. A cystic structure is found in the popliteal fossa. Left: There is no evidence of deep vein thrombosis in  the lower extremity. No cystic structure found in the popliteal fossa.  Pulsatile lower extremity venous flow is suggestive of possible elevated right heart pressure. *See table(s) above for measurements and observations. Electronically signed by Monica Martinez MD on 08/26/2019 at 1:26:25 PM.    Final    Echocardiogram 10/24: 1. Left ventricular ejection fraction, by visual estimation, is 50 to 55%. The left ventricle has severely decreased function. There is moderately increased left ventricular hypertrophy.  2. Lateral wall hypokinesis to akinesis.  3. Global right ventricle has low normal systolic function.The right ventricular size is normal. No increase in right ventricular wall thickness.  4. Left atrial size was normal.  5. Right atrial size was normal.  6. Moderate pleural effusion in both left and right lateral regions.  7. Moderate calcification of the mitral valve leaflet(s).  8. Moderate mitral annular calcification.  9. Moderate thickening of the mitral valve leaflet(s). 10. The mitral valve is abnormal. Mild to moderate mitral valve regurgitation. 11. The tricuspid valve is not well visualized. Tricuspid valve regurgitation is trivial. 12. The aortic valve is tricuspid Aortic valve regurgitation is mild by color flow Doppler. 13. The pulmonic valve was grossly normal. Pulmonic valve regurgitation is not visualized by color flow Doppler. 14. Aortic dilatation noted. The aortic root is heavily calcified. 15. The inferior vena cava is dilated in size with <50% respiratory variability, suggesting right atrial pressure of 15 mmHg. 16. The interatrial septum was not well visualized.   Assessment: 85 year old female with subacute left frontal lobe deep white matter ischemic infarction.  1. Exam is limited by poor patient cooperation/encephalopathy. No definite lateralizing findings are noted on motor or sensory exam. Overall findings are most consistent with a diffuse encephalopathy,  most likely secondary to mild diffuse hypoxic/ischemic brain injury, which may or may not be reversible, in conjunction with possible septic encephalopathy given her leukocytosis with fevers in the setting of PNA. Her ischemic infarction is not large enough to account for her encephalopathy.  2. MRI brain: 18 mm focus of diffusion abnormality involving the subcortical left frontal lobe, most consistent with evolving late subacute ischemic infarct. No associated hemorrhage or  mass effect. There is underlying atrophy with mild chronic microvascular ischemic disease. 3. Ammonia, AST and ALT are normal. 4. Recent B12 level was normal 5. Stroke risk factors: CAD, HTN and heart failure.  6. Per Epic medications list, the patient was taking Eliquis 5 mg BID at home in addition to ASA. She is not currently being administered Eliquis due to recent fall, but unclear why she is not on ASA.  7. DVT prophylaxis is with SQ heparin.   Recommendations: 1. Complete her basic stroke work up with CTA of head/neck. 2. Cardiac telemetry. 3. EEG 4. Vitamin B12 level.  5. Restart ASA if there is no contraindication from a medical standpoint.   40 minutes spent in the neurological evaluation and management of this critically ill patient.   Electronically signed: Dr. Kerney Elbe 08/27/2019, 12:22 PM

## 2019-08-28 ENCOUNTER — Inpatient Hospital Stay (HOSPITAL_COMMUNITY): Payer: Medicare Other

## 2019-08-28 DIAGNOSIS — R4182 Altered mental status, unspecified: Secondary | ICD-10-CM | POA: Diagnosis not present

## 2019-08-28 DIAGNOSIS — I6330 Cerebral infarction due to thrombosis of unspecified cerebral artery: Secondary | ICD-10-CM | POA: Insufficient documentation

## 2019-08-28 DIAGNOSIS — J9611 Chronic respiratory failure with hypoxia: Secondary | ICD-10-CM

## 2019-08-28 DIAGNOSIS — I639 Cerebral infarction, unspecified: Secondary | ICD-10-CM

## 2019-08-28 DIAGNOSIS — Z951 Presence of aortocoronary bypass graft: Secondary | ICD-10-CM | POA: Diagnosis not present

## 2019-08-28 LAB — URINE CULTURE: Culture: >=100000 — AB

## 2019-08-28 LAB — CBC WITH DIFFERENTIAL/PLATELET
Abs Immature Granulocytes: 0.13 K/uL — ABNORMAL HIGH (ref 0.00–0.07)
Basophils Absolute: 0.1 K/uL (ref 0.0–0.1)
Basophils Relative: 1 %
Eosinophils Absolute: 0 K/uL (ref 0.0–0.5)
Eosinophils Relative: 0 %
HCT: 31 % — ABNORMAL LOW (ref 36.0–46.0)
Hemoglobin: 9.1 g/dL — ABNORMAL LOW (ref 12.0–15.0)
Immature Granulocytes: 1 %
Lymphocytes Relative: 18 %
Lymphs Abs: 2.2 K/uL (ref 0.7–4.0)
MCH: 22.2 pg — ABNORMAL LOW (ref 26.0–34.0)
MCHC: 29.4 g/dL — ABNORMAL LOW (ref 30.0–36.0)
MCV: 75.6 fL — ABNORMAL LOW (ref 80.0–100.0)
Monocytes Absolute: 1.7 K/uL — ABNORMAL HIGH (ref 0.1–1.0)
Monocytes Relative: 14 %
Neutro Abs: 8 K/uL — ABNORMAL HIGH (ref 1.7–7.7)
Neutrophils Relative %: 66 %
Platelets: 320 K/uL (ref 150–400)
RBC: 4.1 MIL/uL (ref 3.87–5.11)
RDW: 17.3 % — ABNORMAL HIGH (ref 11.5–15.5)
WBC: 12.1 K/uL — ABNORMAL HIGH (ref 4.0–10.5)
nRBC: 0 % (ref 0.0–0.2)

## 2019-08-28 LAB — BASIC METABOLIC PANEL
Anion gap: 7 (ref 5–15)
BUN: 26 mg/dL — ABNORMAL HIGH (ref 8–23)
CO2: 23 mmol/L (ref 22–32)
Calcium: 9.7 mg/dL (ref 8.9–10.3)
Chloride: 101 mmol/L (ref 98–111)
Creatinine, Ser: 0.61 mg/dL (ref 0.44–1.00)
GFR calc Af Amer: >60 mL/min (ref >60)
GFR calc non Af Amer: >60 mL/min (ref >60)
Glucose, Bld: 119 mg/dL — ABNORMAL HIGH (ref 70–99)
Potassium: 4.8 mmol/L (ref 3.5–5.1)
Sodium: 131 mmol/L — ABNORMAL LOW (ref 135–145)

## 2019-08-28 LAB — LIPID PANEL
Cholesterol: 93 mg/dL (ref 0–200)
HDL: 30 mg/dL — ABNORMAL LOW (ref >40)
LDL Cholesterol: 42 mg/dL (ref 0–99)
Total CHOL/HDL Ratio: 3.1 RATIO
Triglycerides: 104 mg/dL (ref <150)
VLDL: 21 mg/dL (ref 0–40)

## 2019-08-28 LAB — GLUCOSE, CAPILLARY
Glucose-Capillary: 122 mg/dL — ABNORMAL HIGH (ref 70–99)
Glucose-Capillary: 128 mg/dL — ABNORMAL HIGH (ref 70–99)
Glucose-Capillary: 133 mg/dL — ABNORMAL HIGH (ref 70–99)
Glucose-Capillary: 125 mg/dL — ABNORMAL HIGH (ref 70–99)
Glucose-Capillary: 110 mg/dL — ABNORMAL HIGH (ref 70–99)
Glucose-Capillary: 114 mg/dL — ABNORMAL HIGH (ref 70–99)

## 2019-08-28 MED ORDER — DIGOXIN 0.25 MG/ML IJ SOLN
0.2500 mg | Freq: Once | INTRAMUSCULAR | Status: AC
  Administered 2019-08-28: 0.25 mg via INTRAVENOUS
  Filled 2019-08-28: qty 2

## 2019-08-28 NOTE — Progress Notes (Signed)
NAME:  Lacey Coleman, MRN:  [id number], DOB:  04/30/1931, LOS: 2 ADMISSION DATE:  12-Sep-2019, CONSULTATION DATE: 08/26/2019 REFERRING MD: Dr. Elesa Massed, CHIEF COMPLAINT: Altered mental status  Brief History   85 year old lady found unresponsive at home, upon arrival by EMS, intubated.  History of present illness   History obtained from chart  85 yo F PMH CAD s/p CABG, CHF, HTN, RA, A fib (eliquis dc 1 week ago) who presents 10/23 with AMS. Patient was found unresponsive at home in chair, found by family. Upon EMS arrival, SpO2 30%.  EMS reported ability to follow commands en route after being placed on NRB; EMS reports degree of aphagia. In ED, patient not following commands, and is subsequently intubated due to hypoxia. Concern for CVA due to AMS; CT  H obtained and is concerning for acute vs subacute infarct. Neurology informally consulted by EDP and will see if MRI brain reveals acute infarct. Patient determined not to be candidate for tPA.   Per patient's daughter, patient experienced a fall during hospitalization approx 1 month ago and has been weak since discharge home.   Lactic acid 2.5, WBC 19.8, ABG 7.28/51/133. BMP pendng  COVID-19 neg Started of vanc and cefepime in ED   Past Medical History   Past Medical History:  Diagnosis Date  . CAD (coronary artery disease)    9/14 LHC with EF 25-35%, mod elevated LVEDP, dLM 90%, pLCx80%, pLAD to mLAD 90%, 1st diag 80%, mid Cx 100%, pRCA 90%, pRCA to mRCA 100%  . Heart failure with reduced ejection fraction (HCC)    a) 9/13 echo with EF 30-35%, mild LVF, diffuse hypokinesis, mod aortic calcification, mild AR  . Hypertension   . Rheumatoid arthritis (HCC)      Significant Hospital Events   Altered mental status Intubated for airway protection CT reveals acute versus subacute infarct MRI did reveal a stroke-subacute  Consults:  Neurology  Procedures:  10/24 endotracheal tube>>  Significant Diagnostic Tests:  10/24 CXR>  bilateral pleural effusions, bibasilar atelectasis vs infiltrate   10/24 CT H non con> 1. No acute intracranial hemorrhage. 2. Mild age-related atrophy and chronic microvascular ischemic changes. 3. Bilateral frontal white matter hypodense lesions, new since theprior CT of 08/07/2019 and concerning for areas of subacute or acute infarct. Clinical correlation is recommended. MRI may provide betterevaluation if clinically indicated.  10/24 MRI brain IMPRESSION: 1. 18 mm focus of diffusion abnormality involving the subcortical left frontal lobe, most consistent with evolving late subacute ischemic infarct. No associated hemorrhage or mass effect. 2. No other acute intracranial abnormality. 3. Underlying atrophy with mild chronic microvascular ischemic disease.  Micro Data:  10/24 SARS CoV2> neg   Antimicrobials:  10/24 vanc>  10/24 cefepime>   Interim history/subjective:  Interacting off sedation, following commands T-max of 100.2 Tolerated pressure support for about 1 hour  Objective   Blood pressure 99/62, pulse (!) 105, temperature 100.2 F (37.9 C), temperature source Core, resp. rate 18, height 5\' 4"  (1.626 m), weight 68.2 kg, SpO2 99 %.    Vent Mode: CPAP;PSV FiO2 (%):  [40 %] 40 % Set Rate:  [18 bmp] 18 bmp Vt Set:  [440 mL] 440 mL PEEP:  [5 cmH20] 5 cmH20 Pressure Support:  [10 cmH20] 10 cmH20 Plateau Pressure:  [16 cmH20-21 cmH20] 20 cmH20   Intake/Output Summary (Last 24 hours) at 08/28/2019 1025 Last data filed at 08/28/2019 0700 Gross per 24 hour  Intake 2655.74 ml  Output 685 ml  Net 1970.74 ml  Filed Weights   08/26/19 0345 08/27/19 0333 08/28/19 0350  Weight: 63.6 kg 64.7 kg 68.2 kg    Examination: General: Elderly, frail, intubated HENT: Moist oral mucosa Lungs: Decreased breath sounds bilaterally, rales at the bases Cardiovascular: S1-S2 appreciated Abdomen: Bowel sounds appreciated Extremities: Lower extremity edema Neuro: Moving  extremities GU: Fair output  Resolved Hospital Problem list    Assessment & Plan:   Encephalopathy CVA on MRI -Neurology did see patient -Continue support -Size of infarct is felt to be small and possibly not contributing to ongoing issues  Acute hypoxemic respiratory failure -Secondary to -Continue cefepime -SBT -Pulmonary toileting  Sepsis -Leukocytosis with fevers Secondary to pneumonia Continue cefepime Follow cultures  Heart failure with reduced ejection fraction -Repeat echocardiogram did show improvement in ejection fraction, severe systolic dysfunction -Significant wall hypokinesis  Hypotension -Successfully weaned off pressors   Heart failure with reduced ejection fraction -Repeat echocardiogram shows improvement in ejection fraction, severe systolic dysfunction -Significant wall hypokinesis  Only tolerated pressure support for 1 hour this morning Not an extubation candidate at present We will try weaning later today  Best practice:  Diet: Continue tube feeds Pain/Anxiety/Delirium protocol (if indicated): Fentanyl as needed VAP protocol (if indicated): In place DVT prophylaxis: Heparin GI prophylaxis: Tonics Glucose control: Insulin Mobility: Bedrest Code Status: Partial code Family Communication: Discussed with daughter at bedside 10/26 2020 Disposition: icu  Labs   CBC: Recent Labs  Lab 08/26/19 0010 08/26/19 0011 08/26/19 0441 08/26/19 1305  WBC  --  19.8* 18.4*  --   NEUTROABS  --  16.1*  --   --   HGB 12.6 11.7* 9.9* 10.5*  HCT 37.0 39.3 32.2* 31.0*  MCV  --  75.3* 72.7*  --   PLT  --  464* 292  --     Basic Metabolic Panel: Recent Labs  Lab 08/26/19 0010 08/26/19 0154 08/26/19 0441 08/26/19 1305 08/26/19 1837  NA 122* 125*  --  126* 128*  K 5.0 5.1  --  4.2 4.4  CL  --  96*  --   --  99  CO2  --  16*  --   --  19*  GLUCOSE  --  144*  --   --  117*  BUN  --  15  --   --  14  CREATININE  --  0.62  --   --  0.69  CALCIUM   --  9.0  --   --  9.4  MG  --  1.8 1.8  --   --   PHOS  --  4.8* 4.0  --   --    GFR: Estimated Creatinine Clearance: 47.9 mL/min (by C-G formula based on SCr of 0.69 mg/dL). Recent Labs  Lab 08/26/19 0011 08/26/19 0154 08/26/19 0441  PROCALCITON  --  <0.10  --   WBC 19.8*  --  18.4*  LATICACIDVEN 2.5*  --  2.3*    Liver Function Tests: Recent Labs  Lab 08/26/19 0441  AST 28  ALT 21  ALKPHOS 110  BILITOT 1.5*  PROT 4.8*  ALBUMIN 2.5*   No results for input(s): LIPASE, AMYLASE in the last 168 hours. Recent Labs  Lab 08/26/19 0441  AMMONIA 15    ABG    Component Value Date/Time   PHART 7.443 08/26/2019 1305   PCO2ART 29.5 (L) 08/26/2019 1305   PO2ART 213.0 (H) 08/26/2019 1305   HCO3 20.2 08/26/2019 1305   TCO2 21 (L) 08/26/2019 1305   ACIDBASEDEF 3.0 (H)  08/26/2019 1305   O2SAT 100.0 08/26/2019 1305      The patient is critically ill with multiple organ systems failure and requires high complexity decision making for assessment and support, frequent evaluation and titration of therapies, application of advanced monitoring technologies and extensive interpretation of multiple databases. Critical Care Time devoted to patient care services described in this note independent of APP/resident time (if applicable)  is 35 minutes.   Sherrilyn Rist MD [facility] Personal pager: [phone] If unanswered, please page CCM On-call: [phone]

## 2019-08-28 NOTE — Progress Notes (Addendum)
STROKE TEAM PROGRESS NOTE   HISTORY OF PRESENT ILLNESS (per record)  Lacey Coleman is an 85 y.o. female with CAD, CABG, CHF (last ECHO 25-30% with diffuse hypokinesis in Sep, 2020), HTN, A-fib (previously on Eliquis but stopped after a fall on 10/5) and RA who presented to the ED on 10/23 after being found unresponsive in her chair at home by her family, daughter describes "blue lips" and respiratory difficulty. She also says she was diffusely weak and unable to walk just prior to this. She was found to be hypoxic in the ED and required intubation.    Due to encephalopathy, CT head was obtained, revealing bilateral frontal white matter hypodense lesions, new since the prior CT of 08/07/2019 and concerning for areas of subacute or acute infarct. MRI then done for better etiology, which revealed an 18 mm focus of diffusion abnormality involving the subcortical left frontal lobe, most consistent with evolving late subacute ischemic infarct.   INTERVAL HISTORY Her dtr is at the bedside. She has not been able to pass SBTs thus far and con't to have respiratory difficulty. Neuro exam stable, and from stroke perspective she is doing well with no focal deficits.  I have personally reviewed history of presenting illness, electronic medical records and imaging films in PACS OBJECTIVE Vitals:   08/28/19 0754 08/28/19 1145 08/28/19 1200 08/28/19 1604  BP: 99/62 100/69 111/65 (!) 79/48  Pulse: (!) 105 (!) 121 (!) 123 (!) 129  Resp: '18 18 18 20  '$ Temp:   98.2 F (36.8 C)   TempSrc:   Oral   SpO2: 99% 98% 98% 97%  Weight:      Height:        CBC:  Recent Labs  Lab 08/26/19 0011 08/26/19 0441 08/26/19 1305 08/28/19 0830  WBC 19.8* 18.4*  --  12.1*  NEUTROABS 16.1*  --   --  8.0*  HGB 11.7* 9.9* 10.5* 9.1*  HCT 39.3 32.2* 31.0* 31.0*  MCV 75.3* 72.7*  --  75.6*  PLT 464* 292  --  725    Basic Metabolic Panel:  Recent Labs  Lab 08/26/19 0154 08/26/19 0441  08/26/19 1837 08/28/19 0830  NA  125*  --    < > 128* 131*  K 5.1  --    < > 4.4 4.8  CL 96*  --   --  99 101  CO2 16*  --   --  19* 23  GLUCOSE 144*  --   --  117* 119*  BUN 15  --   --  14 26*  CREATININE 0.62  --   --  0.69 0.61  CALCIUM 9.0  --   --  9.4 9.7  MG 1.8 1.8  --   --   --   PHOS 4.8* 4.0  --   --   --    < > = values in this interval not displayed.    Lipid Panel:     Component Value Date/Time   CHOL 93 08/28/2019 0830   TRIG 104 08/28/2019 0830   HDL 30 (L) 08/28/2019 0830   CHOLHDL 3.1 08/28/2019 0830   VLDL 21 08/28/2019 0830   LDLCALC 42 08/28/2019 0830   HgbA1c:  Lab Results  Component Value Date   HGBA1C 5.5 08/26/2019   Urine Drug Screen:     Component Value Date/Time   LABOPIA NONE DETECTED 08/26/2019 0001   COCAINSCRNUR NONE DETECTED 08/26/2019 0001   LABBENZ POSITIVE (A) 08/26/2019 0001   AMPHETMU  NONE DETECTED 08/26/2019 0001   THCU NONE DETECTED 08/26/2019 0001   LABBARB NONE DETECTED 08/26/2019 0001    Alcohol Level No results found for: [facility]  IMAGING   Dg Chest Port 1 View  Result Date: 08/28/2019 CLINICAL DATA:  Altered mental status which shortness-of-breath. EXAM: PORTABLE CHEST 1 VIEW COMPARISON:  08/26/2019 FINDINGS: Patient is rotated to the right. Endotracheal tube has tip 2.4 cm above the carina. Enteric tube courses into the region of the stomach and off the film as tip is not visualized. There is evidence of moderate size bilateral pleural effusions likely with associated bibasilar atelectasis as the right effusion is worse and left effusion not significantly changed. Remainder of the exam is unchanged. IMPRESSION: Continued moderate size bilateral pleural effusions with interval worsening of the right effusion and stable left effusion. Likely associated bibasilar atelectasis. Tubes and lines as described. Electronically Signed   By: Marin Olp M.D.   On: 08/28/2019 11:29   Vas [place] Transcranial Doppler  Result Date: 08/28/2019  Transcranial Doppler  Indications: Stroke. Limitations for diagnostic windows: Unable to insonate right transtemporal window. Unable to insonate left transtemporal window. Comparison Study: No prior. Performing Technologist: Oda Cogan RDMS, RVT  Examination Guidelines: A complete evaluation includes B-mode imaging, spectral Doppler, color Doppler, and power Doppler as needed of all accessible portions of each vessel. Bilateral testing is considered an integral part of a complete examination. Limited examinations for reoccurring indications may be performed as noted.  +----------+-------------+----------+-----------+-------+ RIGHT TCD Right VM (cm)Depth (cm)PulsatilityComment +----------+-------------+----------+-----------+-------+ Opthalmic     19.00                                 +----------+-------------+----------+-----------+-------+ ICA siphon    34.00                                 +----------+-------------+----------+-----------+-------+ Vertebral    -22.00                                 +----------+-------------+----------+-----------+-------+  +----------+------------+----------+-----------+-------+ LEFT TCD  Left VM (cm)Depth (cm)PulsatilityComment +----------+------------+----------+-----------+-------+ Opthalmic    24.00                                 +----------+------------+----------+-----------+-------+ ICA siphon   63.00                                 +----------+------------+----------+-----------+-------+ Vertebral    -21.00                                +----------+------------+----------+-----------+-------+  +------------+-------+------------+             VM cm/s  Comment    +------------+-------+------------+ Prox Basilar       no insonated +------------+-------+------------+ Summary:  Absent bitemporal and ppor suboccipital window limits evaluation. Normal blood flow directions in both opthalmics,carotid siphons and vertebral arteries. *See  table(s) above for measurements and observations.  Diagnosing physician: Antony Contras MD Electronically signed by Antony Contras MD on 08/28/2019 at 3:13:12 PM.    Final      Transthoracic Echocardiogram   Recent Results (from the past 43800 hour(s))  ECHOCARDIOGRAM COMPLETE   Collection Time: 08/26/19 10:38 AM  Result Value   Weight 2,243.4   Height 64   BP 122/108   Narrative     ECHOCARDIOGRAM REPORT       Patient Name:   JOSELYNN AMOROSO Date of Exam: 08/26/2019 Medical Rec #:  [id number]     Height:       64.0 in Accession #:    [id number]    Weight:       140.2 lb Date of Birth:  08/03/1931      BSA:          1.68 m Patient Age:    53 years      BP:           122/108 mmHg Patient Gender: F             HR:           106 bpm. Exam Location:  Inpatient  Procedure: 2D Echo, Cardiac Doppler and Color Doppler  Indications:    Shock   History:        Patient has prior history of Echocardiogram examinations, most                 recent 07/21/2019. Previous Myocardial Infarction and CAD; Prior                 CABG Signs/Symptoms:Altered Mental Status Risk                 Factors:Hypertension. Systolic heart failure.   Sonographer:    Roseanna Rainbow RDCS Referring Phys: [id number] GRACE E BOWSER    Sonographer Comments: Technically difficult study due to poor echo windows and echo performed with patient supine and on artificial respirator. Image acquisition challenging due to uncooperative patient. Patient sat up during exam. IMPRESSIONS    1. Left ventricular ejection fraction, by visual estimation, is 50 to 55%. The left ventricle has severely decreased function. There is moderately increased left ventricular hypertrophy.  2. Lateral wall hypokinesis to akinesis.  3. Global right ventricle has low normal systolic function.The right ventricular size is normal. No increase in right ventricular wall thickness.  4. Left atrial size was normal.  5. Right atrial size was normal.  6. Moderate  pleural effusion in both left and right lateral regions.  7. Moderate calcification of the mitral valve leaflet(s).  8. Moderate mitral annular calcification.  9. Moderate thickening of the mitral valve leaflet(s). 10. The mitral valve is abnormal. Mild to moderate mitral valve regurgitation. 11. The tricuspid valve is not well visualized. Tricuspid valve regurgitation is trivial. 12. The aortic valve is tricuspid Aortic valve regurgitation is mild by color flow Doppler. 13. The pulmonic valve was grossly normal. Pulmonic valve regurgitation is not visualized by color flow Doppler. 14. Aortic dilatation noted. The aortic root is heavily calcified. 15. The inferior vena cava is dilated in size with <50% respiratory variability, suggesting right atrial pressure of 15 mmHg. 16. The interatrial septum was not well visualized.  In comparison to the previous echocardiogram(s): Prior examinations were reviewed in a side by side comparison of images. 07/21/2019: LVEF 25-30%. FINDINGS  Left Ventricle: Left ventricular ejection fraction, by visual estimation, is 50 to 55%. The left ventricle has severely decreased function. There is moderately increased left ventricular hypertrophy.  Right Ventricle: The right ventricular size is normal. No increase in right ventricular wall thickness. Global RV systolic function is has low normal systolic function.  Left Atrium: Left atrial  size was normal in size.  Right Atrium: Right atrial size was normal in size  Pericardium: There is no evidence of pericardial effusion. There is a moderate pleural effusion in both left and right lateral regions.  Mitral Valve: The mitral valve is abnormal. There is moderate thickening of the mitral valve leaflet(s). There is moderate calcification of the mitral valve leaflet(s). Moderate mitral annular calcification. Mild to moderate mitral valve regurgitation.  Tricuspid Valve: The tricuspid valve is not well visualized.  Tricuspid valve regurgitation is trivial by color flow Doppler.  Aortic Valve: The aortic valve is tricuspid. Aortic valve regurgitation is mild by color flow Doppler. Aortic regurgitation PHT measures 382 msec.  Pulmonic Valve: The pulmonic valve was grossly normal. Pulmonic valve regurgitation is not visualized by color flow Doppler.  Aorta: Aortic dilatation noted.  Venous: The inferior vena cava is dilated in size with less than 50% respiratory variability, suggesting right atrial pressure of 15 mmHg.  IAS/Shunts: The interatrial septum was not well visualized.     LEFT VENTRICLE PLAX 2D LVIDd:         2.90 cm       Diastology LVIDs:         2.40 cm       LV e' lateral:   7.94 cm/s LV PW:         1.40 cm       LV E/e' lateral: 15.0 LV IVS:        1.47 cm       LV e' medial:    4.79 cm/s LVOT diam:     1.90 cm       LV E/e' medial:  24.8 LV SV:         12 ml LV SV Index:   7.07 LVOT Area:     2.84 cm   LV Volumes (MOD) LV area d, A2C:    20.30 cm LV area d, A4C:    16.30 cm LV area s, A2C:    11.90 cm LV area s, A4C:    11.10 cm LV major d, A2C:   6.48 cm LV major d, A4C:   6.10 cm LV major s, A2C:   5.41 cm LV major s, A4C:   5.85 cm LV vol d, MOD A2C: 51.5 ml LV vol d, MOD A4C: 35.7 ml LV vol s, MOD A2C: 22.5 ml LV vol s, MOD A4C: 17.5 ml LV SV MOD A2C:     29.0 ml LV SV MOD A4C:     35.7 ml LV SV MOD BP:      23.6 ml  RIGHT VENTRICLE             IVC RV S prime:     10.20 cm/s  IVC diam: 2.30 cm TAPSE (M-mode): 1.2 cm  LEFT ATRIUM             Index       RIGHT ATRIUM           Index LA diam:        3.70 cm 2.20 cm/m  RA Area:     11.00 cm LA Vol (A2C):   49.9 ml 29.66 ml/m RA Volume:   26.20 ml  15.57 ml/m LA Vol (A4C):   38.4 ml 22.83 ml/m LA Biplane Vol: 45.1 ml 26.81 ml/m  AORTIC VALVE LVOT Vmax:   125.00 cm/s LVOT Vmean:  72.800 cm/s LVOT VTI:    0.197 m AI PHT:      382  msec   AORTA Ao Root diam: 3.10 cm Ao Asc diam:  3.80 cm  MITRAL  VALVE MV Area (PHT): 5.26 cm             SHUNTS MV PHT:        41.86 msec           Systemic VTI:  0.20 m MV Decel Time: 144 msec             Systemic Diam: 1.90 cm MV E velocity: 119.00 cm/s 103 cm/s    Lyman Bishop MD Electronically signed by Lyman Bishop MD Signature Date/Time: 08/26/2019/2:28:06 PM       Final     *Note: Due to a large number of results and/or encounters for the requested time period, some results have not been displayed. A complete set of results can be found in Results Review.   ECG - SR rate 120 BPM. (See cardiology reading for complete details)  PHYSICAL EXAM Blood pressure (!) 79/48, pulse (!) 129, temperature 98.2 F (36.8 C), temperature source Oral, resp. rate 20, height '5\' 4"'$  (1.626 m), weight 68.2 kg, SpO2 97 %. Pleasant elderly Caucasian lady who is intubated but not sedated Gen- alert, mild distress HEENT-  normocephalis   Lungs- Intubated, vented Extremities- Warm and well perfused  Neurological Examination Mental Status: Alert, attends, tracks, follows all commands.Unable to speak d/t ETT Cranial Nerves: II: bliks and tracks in all quadrants. PERRL.  III,IV, VI: No ptosis. Eyes conjugate at the midline.  V,VII: Intubated with face grossly symmetric. Intact facial sensation.  VIII: hearing intact to voice IX,X: Intubated XI: No asymmetry XII: Intubated, but able to stick tongue out under tube Motor: moving all ext, some mild weakness in legs and RLE is limited d/t pain.  Sensory: intact to LT throughout Deep Tendon Reflexes: 3+ bilateral brachioradialis and biceps. 2+ patellae bilaterally. Toes upgoing bilaterally  Cerebellar/Gait: No gross ataxia noted  HOME MEDICATIONS:  Medications Prior to Admission  Medication Sig Dispense Refill  . acetaminophen (TYLENOL) 500 MG tablet May take 1 tablet (500 mg total) by mouth every 6 (six) hours as needed for mild pain. May also take 1 tablet (500 mg total) every 6 (six) hours as needed for  mild pain. 30 tablet 0  . acidophilus (RISAQUAD) CAPS capsule Take 1 capsule by mouth daily.    Marland Kitchen amiodarone (PACERONE) 200 MG tablet Take 1 tablet (200 mg total) by mouth daily. 30 tablet 6  . aspirin EC 81 MG EC tablet Take 1 tablet (81 mg total) by mouth daily.    Marland Kitchen atorvastatin (LIPITOR) 40 MG tablet Take 1 tablet (40 mg total) by mouth daily at 6 PM. 30 tablet 0  . Coenzyme Q10-Vitamin E (QUNOL ULTRA COQ10 PO) Take by mouth.    . furosemide (LASIX) 40 MG tablet Take 1 tablet (40 mg total) by mouth 2 (two) times daily. (Patient taking differently: Take 20 mg by mouth daily. ) 60 tablet 11  . metoprolol tartrate (LOPRESSOR) 25 MG tablet Take 0.5 tablets (12.5 mg total) by mouth 2 (two) times daily. 60 tablet 1  . potassium chloride (K-DUR) 10 MEQ tablet Take 1 tablet (10 mEq total) by mouth 2 (two) times daily. 60 tablet 0  . psyllium (METAMUCIL) 58.6 % powder Take 1 packet by mouth 2 (two) times daily as needed (constipation).    . temazepam (RESTORIL) 15 MG capsule Take 1 capsule (15 mg total) by mouth at bedtime as needed for sleep. 30 capsule 0  .  traMADol (ULTRAM) 50 MG tablet 1/2 - 1 tablet q 6-8 hours prn pain 15 tablet 0  . Turmeric 500 MG CAPS Take 500 mg by mouth daily.    Marland Kitchen apixaban (ELIQUIS) 5 MG TABS tablet Take 1 tablet (5 mg total) by mouth 2 (two) times daily. 60 tablet 1  . spironolactone (ALDACTONE) 25 MG tablet Take 1 tablet (25 mg total) by mouth daily. 15 tablet 6      HOSPITAL MEDICATIONS:  . aspirin  81 mg Per Tube Daily  . chlorhexidine gluconate (MEDLINE KIT)  15 mL Mouth Rinse BID  . Chlorhexidine Gluconate Cloth  6 each Topical Daily  . etomidate  20 mg Intravenous Once  . feeding supplement (PRO-STAT SUGAR FREE 64)  30 mL Per Tube BID  . feeding supplement (VITAL HIGH PROTEIN)  1,000 mL Per Tube Q24H  . heparin  5,000 Units Subcutaneous Q8H  . insulin aspart  0-9 Units Subcutaneous Q4H  . mouth rinse  15 mL Mouth Rinse 10 times per day  . pantoprazole  (PROTONIX) IV  40 mg Intravenous Daily  . succinylcholine  100 mg Intravenous Once   ASSESSMENT/PLAN Ms. NANDITA MATHENIA is a 85 y.o. female with history of  CAD, CABG, CHF (last ECHO 25-30% with diffuse hypokinesis), HTN, A-fib (previously on Eliquis but stopped after a fall on 10/5) and RA who presented to the ED on 10/23 after being found unresponsive in her chair at home by her family. Neurology consulted after neuro imaging showed stroke.   Stroke: subcortical left frontal lobe infarct. small vessel disease likely  Resultant do not see any lasting clinical effects. Presenting AMS is likely d/t hypoxia  Code Stroke CT Head -  Not done  CT head- revealing bilateral frontal white matter hypodense lesions, new since the prior CT of 08/07/2019 and concerning for areas of subacute or acute infarct.   MRI head- 18 mm focus of diffusion abnormality involving the subcortical left frontal lobe, most consistent with evolving late subacute ischemic infarct.  CTA H&N   CT Perfusion- not done  2D Echo - pending; Previous TEE showed:  25-30% with diffuse hypokinesis  [employer] Virus 2  neg  LDL -42    Component Value Date/Time   LDLCALC 42 08/28/2019 0830     HgbA1c - 5.5  UDS not done  VTE prophylaxis - heparin SQ Diet  Diet Order            Diet NPO time specified  Diet effective now               Eliquis (until this month when stopped d/t fall, then started ASA prior to admission, now on ASA only  Patient counseled to be compliant with her antithrombotic medications  Ongoing aggressive stroke risk factor management  Therapy recommendations:  pending  Disposition:  Pending  Hypertension  Home BP meds: Amiodarone, Lopressor, Lasix, Aldactone  Current BP meds: none; she has been hypotensive  Stable . Permissive hypertension (OK if < 220/120) but gradually normalize in 5-7 days . Long-term BP goal normotensive  Hyperlipidemia  Home Lipid lowering medication:  Lipitor '40mg'$   LDL 42, goal < 70  Current lipid lowering medication: Lipitor '40mg'$   Continue statin at discharge  Diabetes  Home diabetic meds:none  NO hx of DM2  HgbA1c 5.6, goal < 7.0 Recent Labs    08/28/19 0816 08/28/19 1212 08/28/19 1619  GLUCAP 128* 133* 125*     Other Stroke Risk Factors  Advanced age  Coronary artery disease and HF   Atrial fibrillation  Other Active Problems  Acute Hypoxic Respiratory Failure- intubated, vented  Recent CABG  Heart Failure (last echo showed 25-30% with diffuse hypokinesis)  Encephalopathy/unresponsive spell- most likely d/t hypoxia, not this stroke  Hospital day # 2  Desiree Metzger-Cihelka, ARNP-C, ANVP-BC Pager: [phone] I have personally obtained history,examined this patient, reviewed notes, independently viewed imaging studies, participated in medical decision making and plan of care.ROS completed by me personally and pertinent positives fully documented  I have made any additions or clarifications directly to the above note.  She presented with chest discomfort and respiratory difficulties likely due to heart failure and MRI shows small left subcortical infarct from small vessel disease.  Recommend continue respiratory support and cardiac care as per critical care team.  From neurological standpoint she can be extubated when ready.  Expect good functional improvement with therapies.  Resume Eliquis when she is able to swallow may use aspirin through 10.  Continue ongoing aggressive risk factor modification.  Discussed with critical care MD and patient's daughter at the bedside and answered questions This patient is critically ill and at significant risk of neurological worsening, death and care requires constant monitoring of vital signs, hemodynamics,respiratory and cardiac monitoring, extensive review of multiple databases, frequent neurological assessment, discussion with family, other specialists and medical decision  making of high complexity.I have made any additions or clarifications directly to the above note.This critical care time does not reflect procedure time, or teaching time or supervisory time of PA/NP/Med Resident etc but could involve care discussion time.  I spent 30 minutes of neurocritical care time  in the care of  this patient.    Antony Contras, MD Medical Director [facility] Pager: [phone] 08/28/2019 5:42 PM  To contact Stroke Continuity provider, please refer to [email or web address] After hours, contact General Neurology

## 2019-08-29 DIAGNOSIS — J9601 Acute respiratory failure with hypoxia: Secondary | ICD-10-CM | POA: Diagnosis not present

## 2019-08-29 DIAGNOSIS — I4892 Unspecified atrial flutter: Secondary | ICD-10-CM

## 2019-08-29 LAB — BASIC METABOLIC PANEL
Anion gap: 6 (ref 5–15)
BUN: 31 mg/dL — ABNORMAL HIGH (ref 8–23)
CO2: 24 mmol/L (ref 22–32)
Calcium: 9.9 mg/dL (ref 8.9–10.3)
Chloride: 104 mmol/L (ref 98–111)
Creatinine, Ser: 0.52 mg/dL (ref 0.44–1.00)
GFR calc Af Amer: >60 mL/min (ref >60)
GFR calc non Af Amer: >60 mL/min (ref >60)
Glucose, Bld: 161 mg/dL — ABNORMAL HIGH (ref 70–99)
Potassium: 4.3 mmol/L (ref 3.5–5.1)
Sodium: 134 mmol/L — ABNORMAL LOW (ref 135–145)

## 2019-08-29 LAB — GLUCOSE, CAPILLARY
Glucose-Capillary: 119 mg/dL — ABNORMAL HIGH (ref 70–99)
Glucose-Capillary: 149 mg/dL — ABNORMAL HIGH (ref 70–99)
Glucose-Capillary: 142 mg/dL — ABNORMAL HIGH (ref 70–99)
Glucose-Capillary: 143 mg/dL — ABNORMAL HIGH (ref 70–99)
Glucose-Capillary: 139 mg/dL — ABNORMAL HIGH (ref 70–99)

## 2019-08-29 LAB — APTT: aPTT: 28 seconds (ref 24–36)

## 2019-08-29 LAB — HEPARIN LEVEL (UNFRACTIONATED): Heparin Unfractionated: <0.1 IU/mL — ABNORMAL LOW (ref 0.30–0.70)

## 2019-08-29 MED ORDER — ATORVASTATIN CALCIUM 40 MG PO TABS
40.0000 mg | ORAL_TABLET | Freq: Every day | ORAL | Status: DC
  Administered 2019-08-29 – 2019-09-05 (×8): 40 mg via ORAL
  Filled 2019-08-29 (×8): qty 1

## 2019-08-29 MED ORDER — AMIODARONE LOAD VIA INFUSION
150.0000 mg | Freq: Once | INTRAVENOUS | Status: AC
  Administered 2019-08-29: 150 mg via INTRAVENOUS
  Filled 2019-08-29: qty 83.34

## 2019-08-29 MED ORDER — MAGNESIUM SULFATE 2 GM/50ML IV SOLN
2.0000 g | Freq: Once | INTRAVENOUS | Status: AC
  Administered 2019-08-29: 2 g via INTRAVENOUS
  Filled 2019-08-29: qty 50

## 2019-08-29 MED ORDER — AMIODARONE HCL 200 MG PO TABS
200.0000 mg | ORAL_TABLET | Freq: Every day | ORAL | Status: DC
  Administered 2019-08-29: 200 mg via ORAL
  Filled 2019-08-29: qty 1

## 2019-08-29 MED ORDER — FAMOTIDINE 40 MG/5ML PO SUSR
20.0000 mg | Freq: Every day | ORAL | Status: DC
Start: 2019-08-29 — End: 2019-09-06
  Administered 2019-08-29 – 2019-09-05 (×8): 20 mg
  Filled 2019-08-29 (×8): qty 2.5

## 2019-08-29 MED ORDER — FUROSEMIDE 10 MG/ML IJ SOLN
40.0000 mg | Freq: Once | INTRAMUSCULAR | Status: AC
  Administered 2019-08-29: 40 mg via INTRAVENOUS
  Filled 2019-08-29: qty 4

## 2019-08-29 MED ORDER — AMIODARONE HCL IN DEXTROSE 360-4.14 MG/200ML-% IV SOLN
30.0000 mg/h | INTRAVENOUS | Status: DC
  Administered 2019-08-30 – 2019-08-31 (×3): 30 mg/h via INTRAVENOUS
  Filled 2019-08-29 (×5): qty 200

## 2019-08-29 MED ORDER — METOPROLOL TARTRATE 25 MG/10 ML ORAL SUSPENSION
25.0000 mg | Freq: Two times a day (BID) | ORAL | Status: DC
  Administered 2019-08-29 – 2019-09-02 (×6): 25 mg
  Filled 2019-08-29 (×8): qty 10

## 2019-08-29 MED ORDER — HEPARIN (PORCINE) 25000 UT/250ML-% IV SOLN
950.0000 [IU]/h | INTRAVENOUS | Status: DC
  Administered 2019-08-29: 900 [IU]/h via INTRAVENOUS
  Administered 2019-08-30 – 2019-09-04 (×5): 1050 [IU]/h via INTRAVENOUS
  Administered 2019-09-05: 950 [IU]/h via INTRAVENOUS
  Filled 2019-08-29 (×8): qty 250

## 2019-08-29 MED ORDER — FUROSEMIDE 10 MG/ML IJ SOLN
80.0000 mg | Freq: Two times a day (BID) | INTRAMUSCULAR | Status: DC
  Administered 2019-08-29 – 2019-08-30 (×3): 80 mg via INTRAVENOUS
  Filled 2019-08-29 (×3): qty 8

## 2019-08-29 MED ORDER — AMIODARONE HCL IN DEXTROSE 360-4.14 MG/200ML-% IV SOLN
60.0000 mg/h | INTRAVENOUS | Status: AC
  Administered 2019-08-29 (×2): 60 mg/h via INTRAVENOUS
  Filled 2019-08-29 (×2): qty 200

## 2019-08-29 NOTE — Progress Notes (Signed)
STROKE TEAM PROGRESS NOTE      INTERVAL HISTORY No neurological changes but she remains in respiratory failure and heart failure requiring ventilatory support .she has not been able to pass SBTs thus far and con't to have respiratory difficulty. Neuro exam stable, and from stroke perspective she is doing well with no focal deficits.  Transcranial Doppler study is limited due to poor windows OBJECTIVE Vitals:   08/29/19 1214 08/29/19 1300 08/29/19 1400 08/29/19 1523  BP: 115/69 100/64 116/68 101/62  Pulse: 86 86 80 85  Resp: '18 18 19 18  '$ Temp:   99.1 F (37.3 C)   TempSrc:   Bladder   SpO2: 99% 99% 99% 100%  Weight:      Height:        CBC:  Recent Labs  Lab 08/26/19 0011 08/26/19 0441 08/26/19 1305 08/28/19 0830  WBC 19.8* 18.4*  --  12.1*  NEUTROABS 16.1*  --   --  8.0*  HGB 11.7* 9.9* 10.5* 9.1*  HCT 39.3 32.2* 31.0* 31.0*  MCV 75.3* 72.7*  --  75.6*  PLT 464* 292  --  025    Basic Metabolic Panel:  Recent Labs  Lab 08/26/19 0154 08/26/19 0441  08/28/19 0830 08/29/19 1153  NA 125*  --    < > 131* 134*  K 5.1  --    < > 4.8 4.3  CL 96*  --    < > 101 104  CO2 16*  --    < > 23 24  GLUCOSE 144*  --    < > 119* 161*  BUN 15  --    < > 26* 31*  CREATININE 0.62  --    < > 0.61 0.52  CALCIUM 9.0  --    < > 9.7 9.9  MG 1.8 1.8  --   --   --   PHOS 4.8* 4.0  --   --   --    < > = values in this interval not displayed.    Lipid Panel:     Component Value Date/Time   CHOL 93 08/28/2019 0830   TRIG 104 08/28/2019 0830   HDL 30 (L) 08/28/2019 0830   CHOLHDL 3.1 08/28/2019 0830   VLDL 21 08/28/2019 0830   LDLCALC 42 08/28/2019 0830   HgbA1c:  Lab Results  Component Value Date   HGBA1C 5.5 08/26/2019   Urine Drug Screen:     Component Value Date/Time   LABOPIA NONE DETECTED 08/26/2019 0001   COCAINSCRNUR NONE DETECTED 08/26/2019 0001   LABBENZ POSITIVE (A) 08/26/2019 0001   AMPHETMU NONE DETECTED 08/26/2019 0001   THCU NONE DETECTED 08/26/2019 0001    LABBARB NONE DETECTED 08/26/2019 0001    Alcohol Level No results found for: [facility]  IMAGING   Dg Chest Port 1 View  Result Date: 08/28/2019 CLINICAL DATA:  Altered mental status which shortness-of-breath. EXAM: PORTABLE CHEST 1 VIEW COMPARISON:  08/26/2019 FINDINGS: Patient is rotated to the right. Endotracheal tube has tip 2.4 cm above the carina. Enteric tube courses into the region of the stomach and off the film as tip is not visualized. There is evidence of moderate size bilateral pleural effusions likely with associated bibasilar atelectasis as the right effusion is worse and left effusion not significantly changed. Remainder of the exam is unchanged. IMPRESSION: Continued moderate size bilateral pleural effusions with interval worsening of the right effusion and stable left effusion. Likely associated bibasilar atelectasis. Tubes and lines as described. Electronically Signed  By: Marin Olp M.D.   On: 08/28/2019 11:29   Vas [place] Transcranial Doppler  Result Date: 08/28/2019  Transcranial Doppler Indications: Stroke. Limitations for diagnostic windows: Unable to insonate right transtemporal window. Unable to insonate left transtemporal window. Comparison Study: No prior. Performing Technologist: Oda Cogan RDMS, RVT  Examination Guidelines: A complete evaluation includes B-mode imaging, spectral Doppler, color Doppler, and power Doppler as needed of all accessible portions of each vessel. Bilateral testing is considered an integral part of a complete examination. Limited examinations for reoccurring indications may be performed as noted.  +----------+-------------+----------+-----------+-------+ RIGHT TCD Right VM (cm)Depth (cm)PulsatilityComment +----------+-------------+----------+-----------+-------+ Opthalmic     19.00                                 +----------+-------------+----------+-----------+-------+ ICA siphon    34.00                                  +----------+-------------+----------+-----------+-------+ Vertebral    -22.00                                 +----------+-------------+----------+-----------+-------+  +----------+------------+----------+-----------+-------+ LEFT TCD  Left VM (cm)Depth (cm)PulsatilityComment +----------+------------+----------+-----------+-------+ Opthalmic    24.00                                 +----------+------------+----------+-----------+-------+ ICA siphon   63.00                                 +----------+------------+----------+-----------+-------+ Vertebral    -21.00                                +----------+------------+----------+-----------+-------+  +------------+-------+------------+             VM cm/s  Comment    +------------+-------+------------+ Prox Basilar       no insonated +------------+-------+------------+ Summary:  Absent bitemporal and ppor suboccipital window limits evaluation. Normal blood flow directions in both opthalmics,carotid siphons and vertebral arteries. *See table(s) above for measurements and observations.  Diagnosing physician: Antony Contras MD Electronically signed by Antony Contras MD on 08/28/2019 at 3:13:12 PM.    Final      Transthoracic Echocardiogram   Recent Results (from the past 43800 hour(s))  ECHOCARDIOGRAM COMPLETE   Collection Time: 08/26/19 10:38 AM  Result Value   Weight 2,243.4   Height 64   BP 122/108   Narrative     ECHOCARDIOGRAM REPORT       Patient Name:   Lacey Coleman Date of Exam: 08/26/2019 Medical Rec #:  [id number]     Height:       64.0 in Accession #:    [id number]    Weight:       140.2 lb Date of Birth:  10/23/1931      BSA:          1.68 m Patient Age:    31 years      BP:           122/108 mmHg Patient Gender: F             HR:  106 bpm. Exam Location:  Inpatient  Procedure: 2D Echo, Cardiac Doppler and Color Doppler  Indications:    Shock   History:        Patient has prior history  of Echocardiogram examinations, most                 recent 07/21/2019. Previous Myocardial Infarction and CAD; Prior                 CABG Signs/Symptoms:Altered Mental Status Risk                 Factors:Hypertension. Systolic heart failure.   Sonographer:    Roseanna Rainbow RDCS Referring Phys: [id number] GRACE E BOWSER    Sonographer Comments: Technically difficult study due to poor echo windows and echo performed with patient supine and on artificial respirator. Image acquisition challenging due to uncooperative patient. Patient sat up during exam. IMPRESSIONS    1. Left ventricular ejection fraction, by visual estimation, is 50 to 55%. The left ventricle has severely decreased function. There is moderately increased left ventricular hypertrophy.  2. Lateral wall hypokinesis to akinesis.  3. Global right ventricle has low normal systolic function.The right ventricular size is normal. No increase in right ventricular wall thickness.  4. Left atrial size was normal.  5. Right atrial size was normal.  6. Moderate pleural effusion in both left and right lateral regions.  7. Moderate calcification of the mitral valve leaflet(s).  8. Moderate mitral annular calcification.  9. Moderate thickening of the mitral valve leaflet(s). 10. The mitral valve is abnormal. Mild to moderate mitral valve regurgitation. 11. The tricuspid valve is not well visualized. Tricuspid valve regurgitation is trivial. 12. The aortic valve is tricuspid Aortic valve regurgitation is mild by color flow Doppler. 13. The pulmonic valve was grossly normal. Pulmonic valve regurgitation is not visualized by color flow Doppler. 14. Aortic dilatation noted. The aortic root is heavily calcified. 15. The inferior vena cava is dilated in size with <50% respiratory variability, suggesting right atrial pressure of 15 mmHg. 16. The interatrial septum was not well visualized.  In comparison to the previous echocardiogram(s): Prior  examinations were reviewed in a side by side comparison of images. 07/21/2019: LVEF 25-30%. FINDINGS  Left Ventricle: Left ventricular ejection fraction, by visual estimation, is 50 to 55%. The left ventricle has severely decreased function. There is moderately increased left ventricular hypertrophy.  Right Ventricle: The right ventricular size is normal. No increase in right ventricular wall thickness. Global RV systolic function is has low normal systolic function.  Left Atrium: Left atrial size was normal in size.  Right Atrium: Right atrial size was normal in size  Pericardium: There is no evidence of pericardial effusion. There is a moderate pleural effusion in both left and right lateral regions.  Mitral Valve: The mitral valve is abnormal. There is moderate thickening of the mitral valve leaflet(s). There is moderate calcification of the mitral valve leaflet(s). Moderate mitral annular calcification. Mild to moderate mitral valve regurgitation.  Tricuspid Valve: The tricuspid valve is not well visualized. Tricuspid valve regurgitation is trivial by color flow Doppler.  Aortic Valve: The aortic valve is tricuspid. Aortic valve regurgitation is mild by color flow Doppler. Aortic regurgitation PHT measures 382 msec.  Pulmonic Valve: The pulmonic valve was grossly normal. Pulmonic valve regurgitation is not visualized by color flow Doppler.  Aorta: Aortic dilatation noted.  Venous: The inferior vena cava is dilated in size with less than 50% respiratory variability, suggesting right atrial  pressure of 15 mmHg.  IAS/Shunts: The interatrial septum was not well visualized.     LEFT VENTRICLE PLAX 2D LVIDd:         2.90 cm       Diastology LVIDs:         2.40 cm       LV e' lateral:   7.94 cm/s LV PW:         1.40 cm       LV E/e' lateral: 15.0 LV IVS:        1.47 cm       LV e' medial:    4.79 cm/s LVOT diam:     1.90 cm       LV E/e' medial:  24.8 LV SV:         12 ml LV SV Index:    7.07 LVOT Area:     2.84 cm   LV Volumes (MOD) LV area d, A2C:    20.30 cm LV area d, A4C:    16.30 cm LV area s, A2C:    11.90 cm LV area s, A4C:    11.10 cm LV major d, A2C:   6.48 cm LV major d, A4C:   6.10 cm LV major s, A2C:   5.41 cm LV major s, A4C:   5.85 cm LV vol d, MOD A2C: 51.5 ml LV vol d, MOD A4C: 35.7 ml LV vol s, MOD A2C: 22.5 ml LV vol s, MOD A4C: 17.5 ml LV SV MOD A2C:     29.0 ml LV SV MOD A4C:     35.7 ml LV SV MOD BP:      23.6 ml  RIGHT VENTRICLE             IVC RV S prime:     10.20 cm/s  IVC diam: 2.30 cm TAPSE (M-mode): 1.2 cm  LEFT ATRIUM             Index       RIGHT ATRIUM           Index LA diam:        3.70 cm 2.20 cm/m  RA Area:     11.00 cm LA Vol (A2C):   49.9 ml 29.66 ml/m RA Volume:   26.20 ml  15.57 ml/m LA Vol (A4C):   38.4 ml 22.83 ml/m LA Biplane Vol: 45.1 ml 26.81 ml/m  AORTIC VALVE LVOT Vmax:   125.00 cm/s LVOT Vmean:  72.800 cm/s LVOT VTI:    0.197 m AI PHT:      382 msec   AORTA Ao Root diam: 3.10 cm Ao Asc diam:  3.80 cm  MITRAL VALVE MV Area (PHT): 5.26 cm             SHUNTS MV PHT:        41.86 msec           Systemic VTI:  0.20 m MV Decel Time: 144 msec             Systemic Diam: 1.90 cm MV E velocity: 119.00 cm/s 103 cm/s    Lyman Bishop MD Electronically signed by Lyman Bishop MD Signature Date/Time: 08/26/2019/2:28:06 PM       Final     *Note: Due to a large number of results and/or encounters for the requested time period, some results have not been displayed. A complete set of results can be found in Results Review.   ECG - SR rate 120 BPM. (See  cardiology reading for complete details)  PHYSICAL EXAM Blood pressure 101/62, pulse 85, temperature 99.1 F (37.3 C), temperature source Bladder, resp. rate 18, height '5\' 4"'$  (1.626 m), weight 69.1 kg, SpO2 100 %. Pleasant elderly Caucasian lady who is intubated but not sedated alert, mild respiratory distress HEENT-  normocephalis   Lungs-  Intubated, vented Extremities- Warm and well perfused  Neurological Examination Mental Status: Alert, attends, tracks, follows all commands.Unable to speak d/t ETT Cranial Nerves: II: bliks and tracks in all quadrants. PERRL.  III,IV, VI: No ptosis. Eyes conjugate at the midline.  V,VII: Intubated with face grossly symmetric. Intact facial sensation.  VIII: hearing intact to voice IX,X: Intubated XI: No asymmetry XII: Intubated, but able to stick tongue out under tube Motor: moving all ext, some mild weakness in legs and RLE is limited d/t pain.  Sensory: intact to LT throughout Deep Tendon Reflexes: 3+ bilateral brachioradialis and biceps. 2+ patellae bilaterally. Toes upgoing bilaterally  Cerebellar/Gait: No gross ataxia noted  HOME MEDICATIONS:  Medications Prior to Admission  Medication Sig Dispense Refill  . acetaminophen (TYLENOL) 500 MG tablet May take 1 tablet (500 mg total) by mouth every 6 (six) hours as needed for mild pain. May also take 1 tablet (500 mg total) every 6 (six) hours as needed for mild pain. 30 tablet 0  . acidophilus (RISAQUAD) CAPS capsule Take 1 capsule by mouth daily.    Marland Kitchen amiodarone (PACERONE) 200 MG tablet Take 1 tablet (200 mg total) by mouth daily. 30 tablet 6  . aspirin EC 81 MG EC tablet Take 1 tablet (81 mg total) by mouth daily.    Marland Kitchen atorvastatin (LIPITOR) 40 MG tablet Take 1 tablet (40 mg total) by mouth daily at 6 PM. 30 tablet 0  . Coenzyme Q10-Vitamin E (QUNOL ULTRA COQ10 PO) Take by mouth.    . furosemide (LASIX) 40 MG tablet Take 1 tablet (40 mg total) by mouth 2 (two) times daily. (Patient taking differently: Take 20 mg by mouth daily. ) 60 tablet 11  . metoprolol tartrate (LOPRESSOR) 25 MG tablet Take 0.5 tablets (12.5 mg total) by mouth 2 (two) times daily. 60 tablet 1  . potassium chloride (K-DUR) 10 MEQ tablet Take 1 tablet (10 mEq total) by mouth 2 (two) times daily. 60 tablet 0  . psyllium (METAMUCIL) 58.6 % powder Take 1 packet by  mouth 2 (two) times daily as needed (constipation).    . temazepam (RESTORIL) 15 MG capsule Take 1 capsule (15 mg total) by mouth at bedtime as needed for sleep. 30 capsule 0  . traMADol (ULTRAM) 50 MG tablet 1/2 - 1 tablet q 6-8 hours prn pain 15 tablet 0  . Turmeric 500 MG CAPS Take 500 mg by mouth daily.    Marland Kitchen apixaban (ELIQUIS) 5 MG TABS tablet Take 1 tablet (5 mg total) by mouth 2 (two) times daily. 60 tablet 1  . spironolactone (ALDACTONE) 25 MG tablet Take 1 tablet (25 mg total) by mouth daily. 15 tablet 6      HOSPITAL MEDICATIONS:  . aspirin  81 mg Per Tube Daily  . atorvastatin  40 mg Oral q1800  . chlorhexidine gluconate (MEDLINE KIT)  15 mL Mouth Rinse BID  . Chlorhexidine Gluconate Cloth  6 each Topical Daily  . etomidate  20 mg Intravenous Once  . famotidine  20 mg Per Tube Daily  . feeding supplement (PRO-STAT SUGAR FREE 64)  30 mL Per Tube BID  . feeding supplement (VITAL HIGH PROTEIN)  1,000 mL Per Tube Q24H  . furosemide  80 mg Intravenous BID  . insulin aspart  0-9 Units Subcutaneous Q4H  . mouth rinse  15 mL Mouth Rinse 10 times per day  . metoprolol tartrate  25 mg Per Tube BID  . succinylcholine  100 mg Intravenous Once   ASSESSMENT/PLAN Ms. Lacey Coleman is a 85 y.o. female with history of  CAD, CABG, CHF (last ECHO 25-30% with diffuse hypokinesis), HTN, A-fib (previously on Eliquis but stopped after a fall on 10/5) and RA who presented to the ED on 10/23 after being found unresponsive in her chair at home by her family. Neurology consulted after neuro imaging showed stroke.   Stroke: subcortical left frontal lobe infarct. small vessel disease likely  Resultant do not see any lasting clinical effects. Presenting AMS is likely d/t hypoxia  Code Stroke CT Head -  Not done  CT head- revealing bilateral frontal white matter hypodense lesions, new since the prior CT of 08/07/2019 and concerning for areas of subacute or acute infarct.   MRI head- 18 mm focus of  diffusion abnormality involving the subcortical left frontal lobe, most consistent with evolving late subacute ischemic infarct.  CTA H&N   CT Perfusion- not done  2D Echo - pending; Previous TEE showed:  25-30% with diffuse hypokinesis  [employer] Virus 2  neg  LDL -42    Component Value Date/Time   LDLCALC 42 08/28/2019 0830    HgbA1c - 5.5  UDS not done  VTE prophylaxis - heparin SQ Diet  Diet Order            Diet NPO time specified  Diet effective now              Eliquis (until this month when stopped d/t fall, then started ASA prior to admission, now on ASA only  Patient counseled to be compliant with her antithrombotic medications  Ongoing aggressive stroke risk factor management  Therapy recommendations:  pending  Disposition:  Pending  Hypertension  Home BP meds: Amiodarone, Lopressor, Lasix, Aldactone  Current BP meds: none; she has been hypotensive  Stable . Permissive hypertension (OK if < 220/120) but gradually normalize in 5-7 days . Long-term BP goal normotensive  Hyperlipidemia  Home Lipid lowering medication: Lipitor '40mg'$   LDL 42, goal < 70  Current lipid lowering medication: Lipitor '40mg'$   Continue statin at discharge  Diabetes  Home diabetic meds:none  NO hx of DM2  HgbA1c 5.6, goal < 7.0 Recent Labs    08/29/19 0325 08/29/19 0750 08/29/19 1147  GLUCAP 119* 149* 142*    Other Stroke Risk Factors  Advanced age  Coronary artery disease and HF   Atrial fibrillation  Other Active Problems  Acute Hypoxic Respiratory Failure- intubated, vented  Recent CABG  Heart Failure (last echo showed 25-30% with diffuse hypokinesis)  Encephalopathy/unresponsive spell- most likely d/t hypoxia, not this stroke  Hospital day # 3    She presented with chest discomfort and respiratory difficulties likely due to heart failure and MRI shows small left subcortical infarct from small vessel disease.  Recommend continue  respiratory support and cardiac care as per critical care team.  From neurological standpoint she can be extubated when ready.  Expect good functional improvement with therapies.  Resume Eliquis when she is able to swallow   Continue ongoing aggressive risk factor modification.  Discussed with Dr. Lynetta Mare critical care MD and patient's daughter at the bedside and answered questions This patient is critically ill  and at significant risk of neurological worsening, death and care requires constant monitoring of vital signs, hemodynamics,respiratory and cardiac monitoring, extensive review of multiple databases, frequent neurological assessment, discussion with family, other specialists and medical decision making of high complexity.I have made any additions or clarifications directly to the above note.This critical care time does not reflect procedure time, or teaching time or supervisory time of PA/NP/Med Resident etc but could involve care discussion time.  I spent 30 minutes of neurocritical care time  in the care of  this patient.    Antony Contras, MD Medical Director [facility] Pager: [phone] 08/29/2019 4:05 PM  To contact Stroke Continuity provider, please refer to [email or web address] After hours, contact General Neurology

## 2019-08-29 NOTE — Progress Notes (Addendum)
NAME:  Lacey Coleman, MRN:  [id number], DOB:  Aug 27, 1931, LOS: 3 ADMISSION DATE:  08/24/2019, CONSULTATION DATE: 08/26/2019 REFERRING MD: Dr. Elesa Massed, CHIEF COMPLAINT: Altered mental status  Brief History   85 year old lady found unresponsive at home, upon arrival by EMS, intubated.  History of present illness   History obtained from chart  85 yo F PMH CAD s/p CABG in 08/2019, CHF, HTN, RA, A fib (eliquis dc 1 week ago) who presents 10/23 with AMS. Patient was found unresponsive at home in chair, found by family. Upon EMS arrival, SpO2 30%.  EMS reported ability to follow commands en route after being placed on NRB; EMS reports degree of aphagia. In ED, patient not following commands, and is subsequently intubated due to hypoxia. Concern for CVA due to AMS; CT  H obtained and is concerning for acute vs subacute infarct. Neurology informally consulted by EDP and will see if MRI brain reveals acute infarct. Patient determined not to be candidate for tPA.   Per patient's daughter, patient experienced a fall during hospitalization approx 1 month ago and has been weak since discharge home.   Lactic acid 2.5, WBC 19.8, ABG 7.28/51/133. BMP pendng  COVID-19 neg Started of vanc and cefepime in ED   Past Medical History   Past Medical History:  Diagnosis Date  . CAD (coronary artery disease)    9/14 LHC with EF 25-35%, mod elevated LVEDP, dLM 90%, pLCx80%, pLAD to mLAD 90%, 1st diag 80%, mid Cx 100%, pRCA 90%, pRCA to mRCA 100%  . Heart failure with reduced ejection fraction (HCC)    a) 9/13 echo with EF 30-35%, mild LVF, diffuse hypokinesis, mod aortic calcification, mild AR  . Hypertension   . Rheumatoid arthritis (HCC)      Significant Hospital Events   Altered mental status Intubated for airway protection CT reveals acute versus subacute infarct MRI did reveal a stroke-subacute  Consults:  Neurology Cardiology.  Procedures:  10/24 endotracheal tube>>  Significant Diagnostic  Tests:  10/24 CXR> bilateral pleural effusions, bibasilar atelectasis vs infiltrate   10/24 CT H non con> 1. No acute intracranial hemorrhage. 2. Mild age-related atrophy and chronic microvascular ischemic changes. 3. Bilateral frontal white matter hypodense lesions, new since theprior CT of 08/07/2019 and concerning for areas of subacute or acute infarct. Clinical correlation is recommended. MRI may provide betterevaluation if clinically indicated.  10/24 MRI brain IMPRESSION: 1. 18 mm focus of diffusion abnormality involving the subcortical left frontal lobe, most consistent with evolving late subacute ischemic infarct. No associated hemorrhage or mass effect. 2. No other acute intracranial abnormality. 3. Underlying atrophy with mild chronic microvascular ischemic disease.  Micro Data:  10/24 SARS CoV2> neg   Antimicrobials:  10/24 vanc>  10/24 cefepime>   Interim history/subjective:  Asthenic, but will follow commands. Failed SBT soon after switching to PSV.  Objective   Blood pressure (!) 177/84, pulse (!) 31, temperature 99.7 F (37.6 C), temperature source Core, resp. rate (!) 23, height 5\' 4"  (1.626 m), weight 69.1 kg, SpO2 96 %.    Vent Mode: PRVC FiO2 (%):  [40 %] 40 % Set Rate:  [18 bmp] 18 bmp Vt Set:  [440 mL] 440 mL PEEP:  [5 cmH20] 5 cmH20 Pressure Support:  [10 cmH20] 10 cmH20 Plateau Pressure:  [19 cmH20-24 cmH20] 21 cmH20   Intake/Output Summary (Last 24 hours) at 08/29/2019 1045 Last data filed at 08/29/2019 1020 Gross per 24 hour  Intake 3084.68 ml  Output 1425 ml  Net 1659.68  ml   Filed Weights   08/27/19 0333 08/28/19 0350 08/29/19 0500  Weight: 64.7 kg 68.2 kg 69.1 kg    Examination: General: Elderly, frail, intubated HENT: Moist oral mucosa Lungs: Decreased breath sounds bilaterally, rales at the bases Cardiovascular: JVP is elevated, apex beat is displaced and sustained heart sounds are distant. Abdomen: Bowel sounds appreciated  Extremities: Lower extremity edema Neuro: Moving extremities GU: [facility] Problem list    Assessment & Plan:   Acute encephalopathy CVA on MRI appear subacute -Neurology did see patient  Critically ill due to acute hypoxemic respiratory failure Secondary to heart failure as cultures and procalcitonin negative -Stop antibiotics -Diuresis and reintroduce chronic heart failure therapy  Heart failure with reduced ejection fraction Repeat echocardiogram did show improvement in ejection fraction, but was performed with patient on full ventilatory support. Significant wall motion abnormalities -Reintroduce home heart failure therapy -Dr. Haroldine Laws to see  Asymptomatic bactiuria. E coli present but urinalysis negative. -Stop antibiotics.  Best practice:  Diet: Continue tube feeds Pain/Anxiety/Delirium protocol (if indicated): Fentanyl as needed VAP protocol (if indicated): In place DVT prophylaxis: Heparin GI prophylaxis: [place] Glucose control: Insulin Mobility: Bedrest Code Status: Partial code Family Communication: Discussed with daughter at bedside 10/26 2020 Disposition: ICU  Labs   CBC: Recent Labs  Lab 08/26/19 0010 08/26/19 0011 08/26/19 0441 08/26/19 1305 08/28/19 0830  WBC  --  19.8* 18.4*  --  12.1*  NEUTROABS  --  16.1*  --   --  8.0*  HGB 12.6 11.7* 9.9* 10.5* 9.1*  HCT 37.0 39.3 32.2* 31.0* 31.0*  MCV  --  75.3* 72.7*  --  75.6*  PLT  --  464* 292  --  824    Basic Metabolic Panel: Recent Labs  Lab 08/26/19 0010 08/26/19 0154 08/26/19 0441 08/26/19 1305 08/26/19 1837 08/28/19 0830  NA 122* 125*  --  126* 128* 131*  K 5.0 5.1  --  4.2 4.4 4.8  CL  --  96*  --   --  99 101  CO2  --  16*  --   --  19* 23  GLUCOSE  --  144*  --   --  117* 119*  BUN  --  15  --   --  14 26*  CREATININE  --  0.62  --   --  0.69 0.61  CALCIUM  --  9.0  --   --  9.4 9.7  MG  --  1.8 1.8  --   --   --   PHOS  --  4.8* 4.0  --   --   --     GFR: Estimated Creatinine Clearance: 48.2 mL/min (by C-G formula based on SCr of 0.61 mg/dL). Recent Labs  Lab 08/26/19 0011 08/26/19 0154 08/26/19 0441 08/28/19 0830  PROCALCITON  --  <0.10  --   --   WBC 19.8*  --  18.4* 12.1*  LATICACIDVEN 2.5*  --  2.3*  --     Liver Function Tests: Recent Labs  Lab 08/26/19 0441  AST 28  ALT 21  ALKPHOS 110  BILITOT 1.5*  PROT 4.8*  ALBUMIN 2.5*   No results for input(s): LIPASE, AMYLASE in the last 168 hours. Recent Labs  Lab 08/26/19 0441  AMMONIA 15    ABG    Component Value Date/Time   PHART 7.443 08/26/2019 1305   PCO2ART 29.5 (L) 08/26/2019 1305   PO2ART 213.0 (H) 08/26/2019 1305   HCO3 20.2  08/26/2019 1305   TCO2 21 (L) 08/26/2019 1305   ACIDBASEDEF 3.0 (H) 08/26/2019 1305   O2SAT 100.0 08/26/2019 1305      CRITICAL CARE Performed by: Lynnell Catalan   Total critical care time: 50 minutes  Critical care time was exclusive of separately billable procedures and treating other patients.  Critical care was necessary to treat or prevent imminent or life-threatening deterioration.  Critical care was time spent personally by me on the following activities: development of treatment plan with patient and/or surrogate as well as nursing, discussions with consultants, evaluation of patient's response to treatment, examination of patient, obtaining history from patient or surrogate, ordering and performing treatments and interventions, ordering and review of laboratory studies, ordering and review of radiographic studies, pulse oximetry, re-evaluation of patient's condition and participation in multidisciplinary rounds.  Lynnell Catalan, MD [facility] ICU Physician [facility]  Pager: [phone] Mobile: [phone] After hours: [phone].

## 2019-08-29 NOTE — Progress Notes (Signed)
ANTICOAGULATION CONSULT NOTE - Initial Consult  Pharmacy Consult for heparin Indication: atrial fibrillation  No Known Allergies  Patient Measurements: Height: '5\' 4"'$  (162.6 cm) Weight: 152 lb 5.4 oz (69.1 kg) IBW/kg (Calculated) : 54.7 Heparin Dosing Weight: 63.6  Vital Signs: Temp: 99.7 F (37.6 C) (10/27 0800) Temp Source: Core (10/27 0800) BP: 177/84 (10/27 1000) Pulse Rate: 31 (10/27 1000)  Labs: Recent Labs    08/26/19 1305 08/26/19 1837 08/28/19 0830  HGB 10.5*  --  9.1*  HCT 31.0*  --  31.0*  PLT  --   --  320  CREATININE  --  0.69 0.61    Estimated Creatinine Clearance: 48.2 mL/min (by C-G formula based on SCr of 0.61 mg/dL).   Medical History: Past Medical History:  Diagnosis Date  . CAD (coronary artery disease)    9/14 LHC with EF 25-35%, mod elevated LVEDP, dLM 90%, pLCx80%, pLAD to mLAD 90%, 1st diag 80%, mid Cx 100%, pRCA 90%, pRCA to mRCA 100%  . Heart failure with reduced ejection fraction ([place])    a) 9/13 echo with EF 30-35%, mild LVF, diffuse hypokinesis, mod aortic calcification, mild AR  . Hypertension   . Rheumatoid arthritis (HCC)     Medications:  Scheduled:  . amiodarone  150 mg Intravenous Once  . aspirin  81 mg Per Tube Daily  . atorvastatin  40 mg Oral q1800  . chlorhexidine gluconate (MEDLINE KIT)  15 mL Mouth Rinse BID  . Chlorhexidine Gluconate Cloth  6 each Topical Daily  . etomidate  20 mg Intravenous Once  . famotidine  20 mg Per Tube Daily  . feeding supplement (PRO-STAT SUGAR FREE 64)  30 mL Per Tube BID  . feeding supplement (VITAL HIGH PROTEIN)  1,000 mL Per Tube Q24H  . heparin  5,000 Units Subcutaneous Q8H  . insulin aspart  0-9 Units Subcutaneous Q4H  . mouth rinse  15 mL Mouth Rinse 10 times per day  . metoprolol tartrate  25 mg Per Tube BID  . succinylcholine  100 mg Intravenous Once   Infusions:  . amiodarone     Followed by  . amiodarone    . fentaNYL infusion INTRAVENOUS 25 mcg/hr (08/29/19 1000)     Assessment: 85 yo F admitted for AMS, found unresponsive at home and intubated due to hypoxia. 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 Patient was started on apixaban for PAF but this was stopped on 10/16 after a fall on 10/5. Pharmacy consulted to initiate IV heparin for PAF with possible cardioversion this admission. Will target lower heparin and aPTT goals with possible stroke.  Goal of Therapy:  Heparin level 0.3-0.5 units/ml aPTT 66-85 seconds Monitor platelets by anticoagulation protocol: Yes   Plan:  Check baseline aPTT and heparin level - lab collecting now Start IV heparin at 900 units/hr  Check 8 hour heparin level and aPTT Daily heparin level, CBC Monitor for bleeding  Vertis Kelch, PharmD PGY2 Cardiology Pharmacy Resident Phone [phone] 08/29/2019       11:27 AM  Please check [email or web address] for unit-specific pharmacist phone numbers

## 2019-08-29 NOTE — Consult Note (Signed)
[place] Nurse wound consult note Patient receiving care in [facility] [id number].  Consult completed remotely after review of record. Photos of areas not available in chart. Reason for Consult: sacral and bilateral heel wounds Wound type: see flowsheet details, including measurements. Pressure Injury POA: Yes/No/NA Measurement: Wound bed: Drainage (amount, consistency, odor)  Periwound: Dressing procedure/placement/frequency: Foam dressings to sacrum, bilateral heels, FLOAT the heels off of the mattress with pillows. Turn right or left and keep pressure off of the sacrum.  I will plan an in person assessment tomorrow. Monitor the wound area(s) for worsening of condition such as: Signs/symptoms of infection,  Increase in size,  Development of or worsening of odor, Development of pain, or increased pain at the affected locations.  Notify the medical team if any of these develop.  Thank you for the consult.  [place] nurse will not follow at this time.  Please re-consult the [place] team if needed.  Val Riles, RN, MSN, CWOCN, CNS-BC, pager [phone]

## 2019-08-29 NOTE — Consult Note (Signed)
Advanced Heart Failure Team Consult Note   Primary Physician: Patient, No Pcp Per PCP-Cardiologist:  Kathlyn Sacramento, MD  HF MD: Dr Haroldine Laws  CT surgery: Dr Julien Girt   Reason for Consultation: Heart Failure   HPI:    Lacey Coleman is seen today for evaluation of heart failure at the request of Dr Lynetta Mare  Lacey Coleman is an 85 yo woman with h/o HTN who was recently admitted to [facility] and found to have severe 3v CAD with high-grade LM disease and EF 25-30% (improved to 35-40% on intra-op TEE)  Underwent CABG x 2 on 07/19/19 (LIMA to LAD, SVG - OM, RCA occluded and nongraftable)  for severe LM disease and CHF. Post CABG course c/b PAF and CVA with aphasia.   Was seen by Dr. Orvan Seen in September and was noted to be volume overloaded. Lasix restarted.   She saw Dr Haroldine Laws on 08/03/19 in follow up. Stable at that time. Weight down 8 pounds but still felt to be mildly volume overloaded. Continued on lasix 20 mg twice a day and spiro was added. Lasix not increased due to risk of overdiuresis. Was in NSR at that time.  Subsequently seen in ER for fall and nasal fracture.   Presented to [facility] via EMS on the evening of 10/23 with AMS/confusion.  O2 sats in the 30s. Placed on NRB but continued to decline requiring intubation.CT of head concerning for bilateral frontal white matter hypodense lesions, new since the prior CT of 08/07/2019 and concerning for areas of subacute or acute infarct. MRI - revealed an 18 mm focus ofdiffusion abnormality involving the subcortical left frontal lobe, most consistent with evolving late subacute ischemic infarct.  Blood CX - NGTD. Urine culture with klebsiella pneumonia. Placed on cefepime. Pertinent admission labs included: SARS-CoV-2 negative, WBC 19.8, Lactic Acid 2.5, procalcitonin < 0.10, creatinie 0.6, hgb 11.7 . On norepi but has been weaned off.  BNP 497 (prrevious 2,027)  ECG with new onset AFL with RVR  Failed vent wean today with rapid hypoxia. CXR  with large bilateral effusions and pulmonary edema.  Today she was started on IV lasix.  Echo 08/26/19 - LVEF --  50 to 55%, lateral wall hypokinesis to akinesis, global right ventricle has low normal systolic function.  ECHO 07/2019 EF 25-30%    Review of Systems: [y] = yes, _0  = no Patient is encephalopathic and or intubated. Therefore history has been obtained from chart review.   . General: Weight gain _1 ; Weight loss _2 ; Anorexia _3 ; Fatigue _4 ; Fever _5 ; Chills _6 ; Weakness _7   . Cardiac: Chest pain/pressure _8 ; Resting SOB _9 ; Exertional SOB _10 ; Orthopnea _11 ; Pedal Edema _12 ; Palpitations _13 ; Syncope _14 ; Presyncope _15 ; Paroxysmal nocturnal dyspnea_16   . Pulmonary: Cough _17 ; Wheezing_18 ; Hemoptysis_19 ; Sputum _20 ; Snoring _21   . GI: Vomiting_22 ; Dysphagia_23 ; Melena_24 ; Hematochezia _25 ; Heartburn_26 ; Abdominal pain _27 ; Constipation _28 ; Diarrhea _29 ; BRBPR _30   . GU: Hematuria_31 ; Dysuria _32 ; Nocturia_33   . Vascular: Pain in legs with walking _34 ; Pain in feet with lying flat _35 ; Non-healing sores _36 ; Stroke [Y ]; TIA _37 ; Slurred speech _38 ;  . Neuro: Headaches_39 ; Vertigo_40 ; Seizures_41 ; Paresthesias_42 ;Blurred vision _43 ; Diplopia _44 ; Vision changes _45   . Ortho/Skin: Arthritis _46 ; Joint pain _47 ;  Muscle pain _0 ; Joint swelling _1 ; Back Pain [ Y]; Rash _2   . Psych: Depression_3 ; Anxiety_4   . Heme: Bleeding problems _5 ; Clotting disorders _6 ; Anemia _7   . Endocrine: Diabetes _8 ; Thyroid dysfunction_9   Home Medications Prior to Admission medications   Medication Sig Start Date End Date Taking? Authorizing Provider  acetaminophen (TYLENOL) 500 MG tablet May take 1 tablet (500 mg total) by mouth every 6 (six) hours as needed for mild pain. May also take 1 tablet (500 mg total) every 6 (six) hours as needed for mild pain. 07/24/19  Yes Gold, Wayne E, PA-C  acidophilus (RISAQUAD) CAPS capsule Take 1 capsule by mouth daily.   Yes [provider]   amiodarone (PACERONE) 200 MG tablet Take 1 tablet (200 mg total) by mouth daily. 08/03/19  Yes Andrian Sabala, Shaune Pascal, MD  aspirin EC 81 MG EC tablet Take 1 tablet (81 mg total) by mouth daily. 07/24/19  Yes Gold, Wayne E, PA-C  atorvastatin (LIPITOR) 40 MG tablet Take 1 tablet (40 mg total) by mouth daily at 6 PM. 07/17/19  Yes Epifanio Lesches, MD  Coenzyme Q10-Vitamin E (QUNOL ULTRA COQ10 PO) Take by mouth.   Yes [provider]  furosemide (LASIX) 40 MG tablet Take 1 tablet (40 mg total) by mouth 2 (two) times daily. Patient taking differently: Take 20 mg by mouth daily.  07/28/19 07/27/20 Yes Wonda Olds, MD  metoprolol tartrate (LOPRESSOR) 25 MG tablet Take 0.5 tablets (12.5 mg total) by mouth 2 (two) times daily. 07/24/19  Yes Gold, Wayne E, PA-C  potassium chloride (K-DUR) 10 MEQ tablet Take 1 tablet (10 mEq total) by mouth 2 (two) times daily. 07/28/19  Yes Atkins, Glenice Bow, MD  psyllium (METAMUCIL) 58.6 % powder Take 1 packet by mouth 2 (two) times daily as needed (constipation).   Yes [provider]  temazepam (RESTORIL) 15 MG capsule Take 1 capsule (15 mg total) by mouth at bedtime as needed for sleep. 08/18/19  Yes Wonda Olds, MD  traMADol (ULTRAM) 50 MG tablet 1/2 - 1 tablet q 6-8 hours prn pain 08/07/19  Yes Letitia Neri L, PA-C  Turmeric 500 MG CAPS Take 500 mg by mouth daily.   Yes [provider]  apixaban (ELIQUIS) 5 MG TABS tablet Take 1 tablet (5 mg total) by mouth 2 (two) times daily. 07/24/19   Gold, Wilder Glade, PA-C  spironolactone (ALDACTONE) 25 MG tablet Take 1 tablet (25 mg total) by mouth daily. 08/03/19   Jheri Mitter, Shaune Pascal, MD    Past Medical History: Past Medical History:  Diagnosis Date  . CAD (coronary artery disease)    9/14 LHC with EF 25-35%, mod elevated LVEDP, dLM 90%, pLCx80%, pLAD to mLAD 90%, 1st diag 80%, mid Cx 100%, pRCA 90%, pRCA to mRCA 100%  . Heart failure with reduced ejection fraction ([place])    a) 9/13 echo with  EF 30-35%, mild LVF, diffuse hypokinesis, mod aortic calcification, mild AR  . Hypertension   . Rheumatoid arthritis [facility])     Past Surgical History: Past Surgical History:  Procedure Laterality Date  . CORONARY ARTERY BYPASS GRAFT N/A 07/19/2019   Procedure: CORONARY ARTERY BYPASS GRAFTING (CABG) x 2; Using Left Internal Mammary Artery (LIMA) and Right Leg Greater Saphenous Vein (SVG) harvested endoscopically; LIMA to LAD, SVG to Circ.;  Surgeon: Wonda Olds, MD;  Location: [place];  Service: Open Heart Surgery;  Laterality: N/A;  . RIGHT/LEFT HEART  CATH AND CORONARY ANGIOGRAPHY N/A 07/17/2019   Procedure: RIGHT/LEFT HEART CATH AND CORONARY ANGIOGRAPHY;  Surgeon: Wellington Hampshire, MD;  Location: [facility];  Service: Cardiovascular;  Laterality: N/A;  . TEE WITHOUT CARDIOVERSION N/A 07/19/2019   Procedure: TRANSESOPHAGEAL ECHOCARDIOGRAM (TEE);  Surgeon: Wonda Olds, MD;  Location: [place];  Service: Open Heart Surgery;  Laterality: N/A;  . TONSILLECTOMY      Family History: No family history on file.  Social History: Social History   Socioeconomic History  . Marital status: Single    Spouse name: Not on file  . Number of children: Not on file  . Years of education: Not on file  . Highest education level: Not on file  Occupational History  . Not on file  Social Needs  . Financial resource strain: Not on file  . Food insecurity    Worry: Not on file    Inability: Not on file  . Transportation needs    Medical: Not on file    Non-medical: Not on file  Tobacco Use  . Smoking status: Never Smoker  . Smokeless tobacco: Never Used  Substance and Sexual Activity  . Alcohol use: Not Currently    Frequency: Never  . Drug use: Not Currently  . Sexual activity: Not on file  Lifestyle  . Physical activity    Days per week: Not on file    Minutes per session: Not on file  . Stress: Not on file  Relationships  . Social Herbalist on phone: Not on file     Gets together: Not on file    Attends religious service: Not on file    Active member of club or organization: Not on file    Attends meetings of clubs or organizations: Not on file    Relationship status: Not on file  Other Topics Concern  . Not on file  Social History Narrative  . Not on file    Allergies:  No Known Allergies  Objective:    Vital Signs:   Temp:  [97.5 F (36.4 C)-99.7 F (37.6 C)] 99.7 F (37.6 C) (10/27 0800) Pulse Rate:  [53-129] 106 (10/27 0800) Resp:  [17-28] 18 (10/27 0800) BP: (79-160)/(47-90) 122/68 (10/27 0800) SpO2:  [92 %-100 %] 96 % (10/27 0800) FiO2 (%):  [40 %] 40 % (10/27 0737) Weight:  [69.1 kg] 69.1 kg (10/27 0500) Last BM Date: (PTA)  Weight change: Filed Weights   08/27/19 0333 08/28/19 0350 08/29/19 0500  Weight: 64.7 kg 68.2 kg 69.1 kg    Intake/Output:   Intake/Output Summary (Last 24 hours) at 08/29/2019 0902 Last data filed at 08/29/2019 0800 Gross per 24 hour  Intake 2920.93 ml  Output 775 ml  Net 2145.93 ml      Physical Exam    General:  Well appearing. No resp difficulty HEENT: normal Neck: supple. JVP . Carotids 2+ bilat; no bruits. No lymphadenopathy or thyromegaly appreciated. Cor: PMI nondisplaced. Regular rate & rhythm. No rubs, gallops or murmurs. Lungs: clear Abdomen: soft, nontender, nondistended. No hepatosplenomegaly. No bruits or masses. Good bowel sounds. Extremities: no cyanosis, clubbing, rash, edema Neuro: alert & orientedx3, cranial nerves grossly intact. moves all 4 extremities w/o difficulty. Affect pleasant   Telemetry   AFL 100-120 Personally reviewed   EKG    AFL 113 + PVCs Personally reviewed   Labs   Basic Metabolic Panel: Recent Labs  Lab 08/26/19 0010 08/26/19 0154 08/26/19 0441 08/26/19 1305 08/26/19 1837 08/28/19  0830  NA 122* 125*  --  126* 128* 131*  K 5.0 5.1  --  4.2 4.4 4.8  CL  --  96*  --   --  99 101  CO2  --  16*  --   --  19* 23  GLUCOSE  --  144*   --   --  117* 119*  BUN  --  15  --   --  14 26*  CREATININE  --  0.62  --   --  0.69 0.61  CALCIUM  --  9.0  --   --  9.4 9.7  MG  --  1.8 1.8  --   --   --   PHOS  --  4.8* 4.0  --   --   --     Liver Function Tests: Recent Labs  Lab 08/26/19 0441  AST 28  ALT 21  ALKPHOS 110  BILITOT 1.5*  PROT 4.8*  ALBUMIN 2.5*   No results for input(s): LIPASE, AMYLASE in the last 168 hours. Recent Labs  Lab 08/26/19 0441  AMMONIA 15    CBC: Recent Labs  Lab 08/26/19 0010 08/26/19 0011 08/26/19 0441 08/26/19 1305 08/28/19 0830  WBC  --  19.8* 18.4*  --  12.1*  NEUTROABS  --  16.1*  --   --  8.0*  HGB 12.6 11.7* 9.9* 10.5* 9.1*  HCT 37.0 39.3 32.2* 31.0* 31.0*  MCV  --  75.3* 72.7*  --  75.6*  PLT  --  464* 292  --  320    Cardiac Enzymes: No results for input(s): CKTOTAL, CKMB, CKMBINDEX, TROPONINI in the last 168 hours.  BNP: BNP (last 3 results) Recent Labs    07/16/19 0141 08/26/19 0441  BNP 2,027.0* 497.7*    ProBNP (last 3 results) No results for input(s): PROBNP in the last 8760 hours.   CBG: Recent Labs  Lab 08/28/19 1619 08/28/19 1954 08/28/19 2300 08/29/19 0325 08/29/19 0750  GLUCAP 125* 110* 114* 119* 149*    Coagulation Studies: No results for input(s): LABPROT, INR in the last 72 hours.   Imaging   Dg Chest Port 1 View  Result Date: 08/28/2019 CLINICAL DATA:  Altered mental status which shortness-of-breath. EXAM: PORTABLE CHEST 1 VIEW COMPARISON:  08/26/2019 FINDINGS: Patient is rotated to the right. Endotracheal tube has tip 2.4 cm above the carina. Enteric tube courses into the region of the stomach and off the film as tip is not visualized. There is evidence of moderate size bilateral pleural effusions likely with associated bibasilar atelectasis as the right effusion is worse and left effusion not significantly changed. Remainder of the exam is unchanged. IMPRESSION: Continued moderate size bilateral pleural effusions with interval  worsening of the right effusion and stable left effusion. Likely associated bibasilar atelectasis. Tubes and lines as described. Electronically Signed   By: Marin Olp M.D.   On: 08/28/2019 11:29   Vas [place] Transcranial Doppler  Result Date: 08/28/2019  Transcranial Doppler Indications: Stroke. Limitations for diagnostic windows: Unable to insonate right transtemporal window. Unable to insonate left transtemporal window. Comparison Study: No prior. Performing Technologist: Oda Cogan RDMS, RVT  Examination Guidelines: A complete evaluation includes B-mode imaging, spectral Doppler, color Doppler, and power Doppler as needed of all accessible portions of each vessel. Bilateral testing is considered an integral part of a complete examination. Limited examinations for reoccurring indications may be performed as noted.  +----------+-------------+----------+-----------+-------+ RIGHT TCD Right VM (cm)Depth (cm)PulsatilityComment +----------+-------------+----------+-----------+-------+ Opthalmic  19.00                                 +----------+-------------+----------+-----------+-------+ ICA siphon    34.00                                 +----------+-------------+----------+-----------+-------+ Vertebral    -22.00                                 +----------+-------------+----------+-----------+-------+  +----------+------------+----------+-----------+-------+ LEFT TCD  Left VM (cm)Depth (cm)PulsatilityComment +----------+------------+----------+-----------+-------+ Opthalmic    24.00                                 +----------+------------+----------+-----------+-------+ ICA siphon   63.00                                 +----------+------------+----------+-----------+-------+ Vertebral    -21.00                                +----------+------------+----------+-----------+-------+  +------------+-------+------------+             VM cm/s  Comment     +------------+-------+------------+ Prox Basilar       no insonated +------------+-------+------------+ Summary:  Absent bitemporal and ppor suboccipital window limits evaluation. Normal blood flow directions in both opthalmics,carotid siphons and vertebral arteries. *See table(s) above for measurements and observations.  Diagnosing physician: Antony Contras MD Electronically signed by Antony Contras MD on 08/28/2019 at 3:13:12 PM.    Final       Medications:     Current Medications: . amiodarone  200 mg Oral Daily  . aspirin  81 mg Per Tube Daily  . atorvastatin  40 mg Oral q1800  . chlorhexidine gluconate (MEDLINE KIT)  15 mL Mouth Rinse BID  . Chlorhexidine Gluconate Cloth  6 each Topical Daily  . etomidate  20 mg Intravenous Once  . famotidine  20 mg Per Tube Daily  . feeding supplement (PRO-STAT SUGAR FREE 64)  30 mL Per Tube BID  . feeding supplement (VITAL HIGH PROTEIN)  1,000 mL Per Tube Q24H  . furosemide  40 mg Intravenous Once  . heparin  5,000 Units Subcutaneous Q8H  . insulin aspart  0-9 Units Subcutaneous Q4H  . mouth rinse  15 mL Mouth Rinse 10 times per day  . metoprolol tartrate  25 mg Per Tube BID  . succinylcholine  100 mg Intravenous Once     Infusions: . fentaNYL infusion INTRAVENOUS 75 mcg/hr (08/28/19 2300)      Assessment/Plan   1. Acute on chronic diastolic HF - Echo EF 43-15%  2. Acute Hypoxic Respiratory Failure - Intubated on admit   3. Paroxysmal AFL with RVR - new  4. Pleural effusions  5. CAD s/p CABG  6. AMS - likely due to hypoxia +/- UTI - doubt CVA major issue here  7. Subacute CVA  8. UTI   Length of Stay: 3  Amy Clegg, NP  08/29/2019, 9:02 AM  Advanced Heart Failure Team Pager [phone] (M-F; 7a - 4p)  Please contact [facility] for night-coverage after hours (4p -7a ) and weekends on [email or web address]  Agree with above.   85 y/o woman with recent admit for NSTEMI. Found to have severe 3vCAD with h-gh grade LM  lesion with ischemic CM EF 25-30%. Underwent CABG. Post-op course c/b transient AF and CVA with transient aphasia. Has been doing relatively well as an outpatient with mild volume overload that we have been addressing. Had been maintaining NSR on po amio.   Admitted with AMS and respiratory failure. EF 50-55% on echo but CXR found to have pulmonary edema and large bilateral effusions. Also found to have recurrent AFL with RVR.   MRI shows possible subacute CVA. Now awake on vent following commands.  On exam General:  Elderly woman awake on vent follows commands HEENT: normal +ETT Neck: supple. JVP to jaw   Carotids 2+ bilat; no bruits. No lymphadenopathy or thryomegaly appreciated. Cor: PMI nondisplaced. Irr tachy Lungs:+ crackles anteriorly  Abdomen: soft, nontender, nondistended. No hepatosplenomegaly. No bruits or masses. Good bowel sounds. Extremities: no cyanosis, clubbing, rash, 1+ edema Neuro: awake on vent follows commands  Agree with Dr. Lynetta Mare, main issue seems to be acute diastolic HF in setting of recurrent AFL with RVR. Will increase IV lasix. Start amio and heparin (no bolus). If effusions do not improve with diuresis will need thoracentesis. Will need TEE/DC-CV prior to extubation.   CRITICAL CARE Performed by: Glori Bickers  Total critical care time: 55 minutes  Critical care time was exclusive of separately billable procedures and treating other patients.  Critical care was necessary to treat or prevent imminent or life-threatening deterioration.  Critical care was time spent personally by me (independent of midlevel providers or residents) on the following activities: development of treatment plan with patient and/or surrogate as well as nursing, discussions with consultants, evaluation of patient's response to treatment, examination of patient, obtaining history from patient or surrogate, ordering and performing treatments and interventions, ordering and review of  laboratory studies, ordering and review of radiographic studies, pulse oximetry and re-evaluation of patient's condition.  Glori Bickers, MD  12:29 PM

## 2019-08-30 ENCOUNTER — Inpatient Hospital Stay (HOSPITAL_COMMUNITY): Payer: Medicare Other

## 2019-08-30 DIAGNOSIS — J9601 Acute respiratory failure with hypoxia: Secondary | ICD-10-CM | POA: Diagnosis not present

## 2019-08-30 DIAGNOSIS — I5033 Acute on chronic diastolic (congestive) heart failure: Secondary | ICD-10-CM

## 2019-08-30 LAB — BASIC METABOLIC PANEL
Anion gap: 7 (ref 5–15)
BUN: 43 mg/dL — ABNORMAL HIGH (ref 8–23)
CO2: 27 mmol/L (ref 22–32)
Calcium: 9.9 mg/dL (ref 8.9–10.3)
Chloride: 100 mmol/L (ref 98–111)
Creatinine, Ser: 0.75 mg/dL (ref 0.44–1.00)
GFR calc Af Amer: >60 mL/min (ref >60)
GFR calc non Af Amer: >60 mL/min (ref >60)
Glucose, Bld: 144 mg/dL — ABNORMAL HIGH (ref 70–99)
Potassium: 3.8 mmol/L (ref 3.5–5.1)
Sodium: 134 mmol/L — ABNORMAL LOW (ref 135–145)

## 2019-08-30 LAB — HEPARIN LEVEL (UNFRACTIONATED)
Heparin Unfractionated: 0.2 IU/mL — ABNORMAL LOW (ref 0.30–0.70)
Heparin Unfractionated: 0.47 IU/mL (ref 0.30–0.70)
Heparin Unfractionated: 0.44 IU/mL (ref 0.30–0.70)

## 2019-08-30 LAB — CBC
HCT: 32.1 % — ABNORMAL LOW (ref 36.0–46.0)
Hemoglobin: 9.2 g/dL — ABNORMAL LOW (ref 12.0–15.0)
MCH: 21.9 pg — ABNORMAL LOW (ref 26.0–34.0)
MCHC: 28.7 g/dL — ABNORMAL LOW (ref 30.0–36.0)
MCV: 76.4 fL — ABNORMAL LOW (ref 80.0–100.0)
Platelets: 303 10*3/uL (ref 150–400)
RBC: 4.2 MIL/uL (ref 3.87–5.11)
RDW: 17.4 % — ABNORMAL HIGH (ref 11.5–15.5)
WBC: 10.1 10*3/uL (ref 4.0–10.5)
nRBC: 0 % (ref 0.0–0.2)

## 2019-08-30 LAB — GLUCOSE, CAPILLARY
Glucose-Capillary: 119 mg/dL — ABNORMAL HIGH (ref 70–99)
Glucose-Capillary: 133 mg/dL — ABNORMAL HIGH (ref 70–99)
Glucose-Capillary: 141 mg/dL — ABNORMAL HIGH (ref 70–99)
Glucose-Capillary: 146 mg/dL — ABNORMAL HIGH (ref 70–99)
Glucose-Capillary: 155 mg/dL — ABNORMAL HIGH (ref 70–99)
Glucose-Capillary: 157 mg/dL — ABNORMAL HIGH (ref 70–99)
Glucose-Capillary: 159 mg/dL — ABNORMAL HIGH (ref 70–99)

## 2019-08-30 MED ORDER — NITROGLYCERIN 0.4 MG/HR TD PT24
0.4000 mg | MEDICATED_PATCH | Freq: Every day | TRANSDERMAL | Status: DC
Start: 2019-08-30 — End: 2019-09-02
  Administered 2019-08-30: 0.4 mg via TRANSDERMAL
  Filled 2019-08-30 (×4): qty 1

## 2019-08-30 MED ORDER — POTASSIUM CHLORIDE 20 MEQ PO PACK
40.0000 meq | PACK | Freq: Once | ORAL | Status: AC
  Administered 2019-08-30: 40 meq
  Filled 2019-08-30: qty 2

## 2019-08-30 MED ORDER — IPRATROPIUM-ALBUTEROL 0.5-2.5 (3) MG/3ML IN SOLN
3.0000 mL | Freq: Four times a day (QID) | RESPIRATORY_TRACT | Status: DC | PRN
  Administered 2019-08-30 – 2019-09-02 (×5): 3 mL via RESPIRATORY_TRACT
  Filled 2019-08-30 (×5): qty 3

## 2019-08-30 MED ORDER — METOLAZONE 5 MG PO TABS
5.0000 mg | ORAL_TABLET | Freq: Once | ORAL | Status: AC
  Administered 2019-08-30: 5 mg
  Filled 2019-08-30: qty 1

## 2019-08-30 MED ORDER — SPIRONOLACTONE 12.5 MG HALF TABLET
12.5000 mg | ORAL_TABLET | Freq: Every day | ORAL | Status: DC
  Administered 2019-08-30 – 2019-09-02 (×4): 12.5 mg
  Filled 2019-08-30 (×4): qty 1

## 2019-08-30 MED ORDER — FENTANYL CITRATE (PF) 100 MCG/2ML IJ SOLN
25.0000 ug | INTRAMUSCULAR | Status: DC | PRN
  Administered 2019-08-30 – 2019-09-05 (×17): 25 ug via INTRAVENOUS
  Filled 2019-08-30 (×7): qty 2

## 2019-08-30 MED ORDER — VITAL AF 1.2 CAL PO LIQD
1000.0000 mL | ORAL | Status: DC
  Administered 2019-08-30 – 2019-09-02 (×4): 1000 mL

## 2019-08-30 MED ORDER — FUROSEMIDE 10 MG/ML IJ SOLN
4.0000 mg/h | INTRAVENOUS | Status: DC
  Administered 2019-08-30 – 2019-08-31 (×2): 15 mg/h via INTRAVENOUS
  Filled 2019-08-30 (×3): qty 25

## 2019-08-30 MED ORDER — DILTIAZEM HCL-DEXTROSE 125-5 MG/125ML-% IV SOLN (PREMIX)
5.0000 mg/h | INTRAVENOUS | Status: DC
  Filled 2019-08-30: qty 125

## 2019-08-30 NOTE — Progress Notes (Signed)
ANTICOAGULATION CONSULT NOTE - Follow Up Consult  Pharmacy Consult for heparin Indication: atrial fibrillation  No Known Allergies  Patient Measurements: Height: _0  (162.6 cm) Weight: 150 lb 9.2 oz (68.3 kg) IBW/kg (Calculated) : 54.7 Heparin Dosing Weight: 63.6  Vital Signs: Temp: 99.7 F (37.6 C) (10/28 1019) Temp Source: Bladder (10/28 1019) BP: 107/69 (10/28 0921) Pulse Rate: 129 (10/28 0921)  Labs: Recent Labs    08/28/19 0830 08/29/19 1153 08/29/19 2304 08/30/19 0320 08/30/19 0953  HGB 9.1*  --   --  9.2*  --   HCT 31.0*  --   --  32.1*  --   PLT 320  --   --  303  --   APTT  --  28  --   --   --   HEPARINUNFRC  --  <0.10* 0.20*  --  0.47  CREATININE 0.61 0.52  --  0.75  --     Estimated Creatinine Clearance: 47.9 mL/min (by C-G formula based on SCr of 0.75 mg/dL).   Medical History: Past Medical History:  Diagnosis Date  . CAD (coronary artery disease)    9/14 LHC with EF 25-35%, mod elevated LVEDP, dLM 90%, pLCx80%, pLAD to mLAD 90%, 1st diag 80%, mid Cx 100%, pRCA 90%, pRCA to mRCA 100%  . Heart failure with reduced ejection fraction ([place])    a) 9/13 echo with EF 30-35%, mild LVF, diffuse hypokinesis, mod aortic calcification, mild AR  . Hypertension   . Rheumatoid arthritis ([facility])     Medications:  Scheduled:  . aspirin  81 mg Per Tube Daily  . atorvastatin  40 mg Oral q1800  . chlorhexidine gluconate (MEDLINE KIT)  15 mL Mouth Rinse BID  . Chlorhexidine Gluconate Cloth  6 each Topical Daily  . etomidate  20 mg Intravenous Once  . famotidine  20 mg Per Tube Daily  . feeding supplement (PRO-STAT SUGAR FREE 64)  30 mL Per Tube BID  . feeding supplement (VITAL HIGH PROTEIN)  1,000 mL Per Tube Q24H  . insulin aspart  0-9 Units Subcutaneous Q4H  . mouth rinse  15 mL Mouth Rinse 10 times per day  . metolazone  5 mg Per Tube Once  . metoprolol tartrate  25 mg Per Tube BID  . nitroGLYCERIN  0.4 mg Transdermal Daily  . potassium chloride  40 mEq Per  Tube Once  . spironolactone  12.5 mg Per Tube Daily  . succinylcholine  100 mg Intravenous Once   Infusions:  . amiodarone 30 mg/hr (08/30/19 0900)  . diltiazem (CARDIZEM) infusion    . fentaNYL infusion INTRAVENOUS 50 mcg/hr (08/29/19 1400)  . furosemide (LASIX) infusion    . heparin 1,050 Units/hr (08/30/19 0900)    Assessment: 85 yo F admitted for AMS, found unresponsive at home and intubated due to hypoxia. 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 Patient was started on apixaban for PAF but this was stopped on 10/16 after a fall on 10/5. Pharmacy consulted to initiate IV heparin for PAF with possible cardioversion this admission - plan for 10/29 AM while still intubated. Target lower heparin goals with possible stroke.  Heparin level after rate increase is therapeutic at 0.47. CBC stable. No signs of bleeding.  Goal of Therapy:  Heparin level 0.3-0.5 units/ml aPTT 66-85 seconds Monitor platelets by anticoagulation protocol: Yes   Plan:  Continue IV heparin at 1050 units/hr  Check 8 hour confirmatory heparin level Daily heparin level, CBC Monitor for bleeding  Vertis Kelch, PharmD PGY2 Cardiology Pharmacy Resident Phone [phone] 08/30/2019       10:35 AM  Please check [email or web address] for unit-specific pharmacist phone numbers

## 2019-08-30 NOTE — H&P (Signed)
See Consultation note 10.24.2020 by Red Christians in lieu of H&P

## 2019-08-30 NOTE — Progress Notes (Signed)
ANTICOAGULATION CONSULT NOTE   Pharmacy Consult for Heparin Indication: atrial fibrillation  No Known Allergies  Patient Measurements: Height: '5\' 4"'$  (162.6 cm) Weight: 152 lb 5.4 oz (69.1 kg) IBW/kg (Calculated) : 54.7 Heparin Dosing Weight: 63.6  Vital Signs: Temp: 99.5 F (37.5 C) (10/27 2000) Temp Source: Bladder (10/27 2000) BP: 89/55 (10/28 0000) Pulse Rate: 85 (10/28 0000)  Labs: Recent Labs    08/28/19 0830 08/29/19 1153 08/29/19 2304  HGB 9.1*  --   --   HCT 31.0*  --   --   PLT 320  --   --   APTT  --  28  --   HEPARINUNFRC  --  <0.10* 0.20*  CREATININE 0.61 0.52  --     Estimated Creatinine Clearance: 48.2 mL/min (by C-G formula based on SCr of 0.52 mg/dL).   Medical History: Past Medical History:  Diagnosis Date  . CAD (coronary artery disease)    9/14 LHC with EF 25-35%, mod elevated LVEDP, dLM 90%, pLCx80%, pLAD to mLAD 90%, 1st diag 80%, mid Cx 100%, pRCA 90%, pRCA to mRCA 100%  . Heart failure with reduced ejection fraction ([place])    a) 9/13 echo with EF 30-35%, mild LVF, diffuse hypokinesis, mod aortic calcification, mild AR  . Hypertension   . Rheumatoid arthritis ([facility])     Medications:  Scheduled:  . aspirin  81 mg Per Tube Daily  . atorvastatin  40 mg Oral q1800  . chlorhexidine gluconate (MEDLINE KIT)  15 mL Mouth Rinse BID  . Chlorhexidine Gluconate Cloth  6 each Topical Daily  . etomidate  20 mg Intravenous Once  . famotidine  20 mg Per Tube Daily  . feeding supplement (PRO-STAT SUGAR FREE 64)  30 mL Per Tube BID  . feeding supplement (VITAL HIGH PROTEIN)  1,000 mL Per Tube Q24H  . furosemide  80 mg Intravenous BID  . insulin aspart  0-9 Units Subcutaneous Q4H  . mouth rinse  15 mL Mouth Rinse 10 times per day  . metoprolol tartrate  25 mg Per Tube BID  . succinylcholine  100 mg Intravenous Once   Infusions:  . amiodarone 30 mg/hr (08/29/19 1720)  . fentaNYL infusion INTRAVENOUS 50 mcg/hr (08/29/19 1400)  . heparin 900 Units/hr  (08/29/19 2300)    Assessment: 85 yo F admitted for AMS, found unresponsive at home and intubated due to hypoxia. 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 Patient was started on apixaban for PAF but this was stopped on 10/16 after a fall on 10/5. Pharmacy consulted to initiate IV heparin for PAF with possible cardioversion this admission. Will target lower heparin and aPTT goals with possible stroke.  10/28 AM update:  Heparin level below goal No issues   Goal of Therapy:  Heparin level 0.3-0.5 units/ml aPTT 66-85 seconds Monitor platelets by anticoagulation protocol: Yes   Plan:  Inc heparin to 1050 units/hr Re-check heparin level in 8 hours  Narda Bonds, PharmD, [place] Pharmacist Phone: [phone]

## 2019-08-30 NOTE — Progress Notes (Signed)
Nutrition Follow-up  DOCUMENTATION CODES:   Not applicable  INTERVENTION:   Tube Feeding:  Change to Vital AF 1.2 at 45 ml/hr Provides 1296 kcals, 81 g of protein and 875 mL of free water Meets 100% estimated calorie and protein needs  NUTRITION DIAGNOSIS:   Inadequate oral intake related to inability to eat as evidenced by NPO status.  Being addressed via TF   GOAL:   Patient will meet greater than or equal to 90% of their needs  Met  MONITOR:   Vent status, Labs, Weight trends, Skin  REASON FOR ASSESSMENT:   Ventilator    ASSESSMENT:   84 year old female with history of CAD s/p CABG, CHF, HTN, and rheumatoid arthritis. She presented to the ED on 08/26/19 with AMS and hypoxia. Her daughter reported that she was having a difficult time standing and ambulating on 10/23, specifically in the evening. No falls or head injuries. Daughter reported blue-appearing lips and patient was breathing rapidly. When EMS arrived, sats were in the 30s on room air and CBGs in the 270s. She was placed on NRB by EMS.  Patient is currently intubated on ventilator support MV: 7.2 L/min Temp (24hrs), Avg:98.5 F (36.9 C), Min:97.4 F (36.3 C), Max:99.7 F (37.6 C)  Vital High Protein at 40 ml/hr, Pro-Stat 30 mL BID  WOC RN evaluated pt's heels today and found no evidenced of pressure injury  Current wt 68.3 kg; admission weight 63.6 kg. Noted weight of 69.1 kg on 10/16.   Labs: Creatinine wdl, sodium 134 (L), BUN 43, CBGs 119-157 (goal 140-180) Meds: lasix drip, ss novolog, KCL   Diet Order:   Diet Order            Diet NPO time specified  Diet effective midnight        Diet NPO time specified  Diet effective now              EDUCATION NEEDS:   No education needs have been identified at this time  Skin:  Skin Assessment: Skin Integrity Issues: Skin Integrity Issues:: DTI, Stage II DTI: R. foor (no pressire injuries on ankles per WOC RN) Stage II: coccyx; L ischial  tuberosity  Last BM:  10/28  Height:   Ht Readings from Last 1 Encounters:  08/26/19 _0  (1.626 m)    Weight:   Wt Readings from Last 1 Encounters:  08/30/19 68.3 kg    Ideal Body Weight:  54.5 kg  BMI:  Body mass index is 25.85 kg/m.  Estimated Nutritional Needs:   Kcal:  1315 kcals  Protein:  76-95 grams  Fluid:  >/= 1.5 L   Cate Jamese Trauger MS, RDN, LDN, CNSC [phone] Pager  [phone] Weekend/On-Call Pager

## 2019-08-30 NOTE — Progress Notes (Signed)
During bedside report pt less responsive and now no longer following commands. All night pt has been following commands with a RASS of -1. Last known normal was at 0530 when pt was turned/postioned. At 0530 pt was able to follow commands and help turn. Oncoming RN contacted the CCM MD who is on floor and he will be at bedside shortly. Fentanyl drip stopped.

## 2019-08-30 NOTE — Consult Note (Signed)
[place] Nurse wound consult note Patient receiving care in [facility] 3M11.  Night and day shift RNs in room.  Stated patient had had a mental status change and could not be turned at this time. I evaluated the bilateral heels and found no evidence of PIs of any type.  The heels were without foam dressings, resting on the mattress. [place] nurse will not follow at this time.  Please re-consult the [place] team if needed.  Val Riles, RN, MSN, CWOCN, CNS-BC, pager [phone]

## 2019-08-30 NOTE — Progress Notes (Signed)
ANTICOAGULATION CONSULT NOTE - Follow Up Consult  Pharmacy Consult for heparin Indication: atrial fibrillation  No Known Allergies  Patient Measurements: Height: '5\' 4"'$  (162.6 cm) Weight: 150 lb 9.2 oz (68.3 kg) IBW/kg (Calculated) : 54.7 Heparin Dosing Weight: 63.6  Vital Signs: Temp: 99 F (37.2 C) (10/28 1745) Temp Source: Bladder (10/28 1745) BP: 93/67 (10/28 1830) Pulse Rate: 100 (10/28 1830)  Labs: Recent Labs    08/28/19 0830  08/29/19 1153 08/29/19 2304 08/30/19 0320 08/30/19 0953 08/30/19 1839  HGB 9.1*  --   --   --  9.2*  --   --   HCT 31.0*  --   --   --  32.1*  --   --   PLT 320  --   --   --  303  --   --   APTT  --   --  28  --   --   --   --   HEPARINUNFRC  --    < > <0.10* 0.20*  --  0.47 0.44  CREATININE 0.61  --  0.52  --  0.75  --   --    < > = values in this interval not displayed.    Estimated Creatinine Clearance: 47.9 mL/min (by C-G formula based on SCr of 0.75 mg/dL).   Medical History: Past Medical History:  Diagnosis Date  . CAD (coronary artery disease)    9/14 LHC with EF 25-35%, mod elevated LVEDP, dLM 90%, pLCx80%, pLAD to mLAD 90%, 1st diag 80%, mid Cx 100%, pRCA 90%, pRCA to mRCA 100%  . Heart failure with reduced ejection fraction ([place])    a) 9/13 echo with EF 30-35%, mild LVF, diffuse hypokinesis, mod aortic calcification, mild AR  . Hypertension   . Rheumatoid arthritis (HCC)     Medications:  Scheduled:  . aspirin  81 mg Per Tube Daily  . atorvastatin  40 mg Oral q1800  . chlorhexidine gluconate (MEDLINE KIT)  15 mL Mouth Rinse BID  . Chlorhexidine Gluconate Cloth  6 each Topical Daily  . etomidate  20 mg Intravenous Once  . famotidine  20 mg Per Tube Daily  . insulin aspart  0-9 Units Subcutaneous Q4H  . mouth rinse  15 mL Mouth Rinse 10 times per day  . metoprolol tartrate  25 mg Per Tube BID  . nitroGLYCERIN  0.4 mg Transdermal Daily  . spironolactone  12.5 mg Per Tube Daily  . succinylcholine  100 mg Intravenous  Once   Infusions:  . amiodarone 30 mg/hr (08/30/19 1800)  . diltiazem (CARDIZEM) infusion    . feeding supplement (VITAL AF 1.2 CAL) 1,000 mL (08/30/19 1649)  . fentaNYL infusion INTRAVENOUS 75 mcg/hr (08/30/19 1630)  . furosemide (LASIX) infusion 15 mg/hr (08/30/19 1800)  . heparin 1,050 Units/hr (08/30/19 1800)    Assessment: 85 yo F admitted for AMS, found unresponsive at home and intubated due to hypoxia. 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 Patient was started on apixaban for PAF but this was stopped on 10/16 after a fall on 10/5. Pharmacy consulted to initiate IV heparin for PAF with possible cardioversion this admission - plan for 10/29 AM while still intubated. Target lower heparin goals with possible stroke.  Confirmatory heparin level is within goal range at 0.44. CBC stable. No signs of bleeding.  Goal of Therapy:  Heparin level 0.3-0.5 units/ml aPTT 66-85 seconds Monitor platelets by anticoagulation protocol: Yes   Plan:  Continue IV heparin at  1050 units/hr  Daily heparin level, CBC Monitor for bleeding  Erin Hearing PharmD., BCPS Clinical Pharmacist 08/30/2019 7:13 PM

## 2019-08-30 NOTE — Progress Notes (Signed)
STROKE TEAM PROGRESS NOTE      INTERVAL HISTORY No neurological changes but she remains in respiratory failure and heart failure requiring ventilatory support .She has not been able to pass SBTs thus far and con't to have respiratory difficulty. Neuro exam stable, and from stroke perspective she is doing well with no focal deficits.    OBJECTIVE Vitals:   08/30/19 1050 08/30/19 1100 08/30/19 1128 08/30/19 1157  BP: (!) 69/44 95/69 100/60   Pulse: 96 94 (!) 103   Resp: _0 Temp:      TempSrc:      SpO2: 98% 99% 99% 99%  Weight:      Height:        CBC:  Recent Labs  Lab 08/26/19 0011  08/28/19 0830 08/30/19 0320  WBC 19.8*   < > 12.1* 10.1  NEUTROABS 16.1*  --  8.0*  --   HGB 11.7*   < > 9.1* 9.2*  HCT 39.3   < > 31.0* 32.1*  MCV 75.3*   < > 75.6* 76.4*  PLT 464*   < > 320 303   < > = values in this interval not displayed.    Basic Metabolic Panel:  Recent Labs  Lab 08/26/19 0154 08/26/19 0441  08/29/19 1153 08/30/19 0320  NA 125*  --    < > 134* 134*  K 5.1  --    < > 4.3 3.8  CL 96*  --    < > 104 100  CO2 16*  --    < > 24 27  GLUCOSE 144*  --    < > 161* 144*  BUN 15  --    < > 31* 43*  CREATININE 0.62  --    < > 0.52 0.75  CALCIUM 9.0  --    < > 9.9 9.9  MG 1.8 1.8  --   --   --   PHOS 4.8* 4.0  --   --   --    < > = values in this interval not displayed.    Lipid Panel:     Component Value Date/Time   CHOL 93 08/28/2019 0830   TRIG 104 08/28/2019 0830   HDL 30 (L) 08/28/2019 0830   CHOLHDL 3.1 08/28/2019 0830   VLDL 21 08/28/2019 0830   LDLCALC 42 08/28/2019 0830   HgbA1c:  Lab Results  Component Value Date   HGBA1C 5.5 08/26/2019   Urine Drug Screen:     Component Value Date/Time   LABOPIA NONE DETECTED 08/26/2019 0001   COCAINSCRNUR NONE DETECTED 08/26/2019 0001   LABBENZ POSITIVE (A) 08/26/2019 0001   AMPHETMU NONE DETECTED 08/26/2019 0001   THCU NONE DETECTED 08/26/2019 0001   LABBARB NONE DETECTED 08/26/2019 0001     Alcohol Level No results found for: [facility]  IMAGING   Dg Chest Port 1 View  Result Date: 08/30/2019 CLINICAL DATA:  CHF and pleural effusions. EXAM: PORTABLE CHEST 1 VIEW COMPARISON:  08/28/2019 FINDINGS: Endotracheal tube remains with the tip approximately 1 cm above the carina. Gastric decompression tube extends below the diaphragm. Some improved aeration noted at both lung bases with moderate bilateral pleural effusions remaining. Mild interstitial edema remains. The heart size is stable. No pneumothorax. IMPRESSION: 1. Some improved aeration at both lung bases with moderate bilateral pleural effusions remaining. Mild interstitial edema remains. 2. Endotracheal tube tip remains approximately 1 cm above the carina. Electronically Signed   By: Jenness Corner.D.  On: 08/30/2019 08:57     Transthoracic Echocardiogram   Recent Results (from the past 43800 hour(s))  ECHOCARDIOGRAM COMPLETE   Collection Time: 08/26/19 10:38 AM  Result Value   Weight 2,243.4   Height 64   BP 122/108   Narrative     ECHOCARDIOGRAM REPORT       Patient Name:   Lacey Coleman Date of Exam: 08/26/2019 Medical Rec #:  [id number]     Height:       64.0 in Accession #:    [id number]    Weight:       140.2 lb Date of Birth:  21-Jun-1931      BSA:          1.68 m Patient Age:    85 years      BP:           122/108 mmHg Patient Gender: F             HR:           106 bpm. Exam Location:  Inpatient  Procedure: 2D Echo, Cardiac Doppler and Color Doppler  Indications:    Shock   History:        Patient has prior history of Echocardiogram examinations, most                 recent 07/21/2019. Previous Myocardial Infarction and CAD; Prior                 CABG Signs/Symptoms:Altered Mental Status Risk                 Factors:Hypertension. Systolic heart failure.   Sonographer:    Roseanna Rainbow RDCS Referring Phys: [id number] GRACE E BOWSER    Sonographer Comments: Technically difficult study due to poor echo windows  and echo performed with patient supine and on artificial respirator. Image acquisition challenging due to uncooperative patient. Patient sat up during exam. IMPRESSIONS    1. Left ventricular ejection fraction, by visual estimation, is 50 to 55%. The left ventricle has severely decreased function. There is moderately increased left ventricular hypertrophy.  2. Lateral wall hypokinesis to akinesis.  3. Global right ventricle has low normal systolic function.The right ventricular size is normal. No increase in right ventricular wall thickness.  4. Left atrial size was normal.  5. Right atrial size was normal.  6. Moderate pleural effusion in both left and right lateral regions.  7. Moderate calcification of the mitral valve leaflet(s).  8. Moderate mitral annular calcification.  9. Moderate thickening of the mitral valve leaflet(s). 10. The mitral valve is abnormal. Mild to moderate mitral valve regurgitation. 11. The tricuspid valve is not well visualized. Tricuspid valve regurgitation is trivial. 12. The aortic valve is tricuspid Aortic valve regurgitation is mild by color flow Doppler. 13. The pulmonic valve was grossly normal. Pulmonic valve regurgitation is not visualized by color flow Doppler. 14. Aortic dilatation noted. The aortic root is heavily calcified. 15. The inferior vena cava is dilated in size with <50% respiratory variability, suggesting right atrial pressure of 15 mmHg. 16. The interatrial septum was not well visualized.  In comparison to the previous echocardiogram(s): Prior examinations were reviewed in a side by side comparison of images. 07/21/2019: LVEF 25-30%. FINDINGS  Left Ventricle: Left ventricular ejection fraction, by visual estimation, is 50 to 55%. The left ventricle has severely decreased function. There is moderately increased left ventricular hypertrophy.  Right Ventricle: The right ventricular size is normal. No increase in  right ventricular wall thickness.  Global RV systolic function is has low normal systolic function.  Left Atrium: Left atrial size was normal in size.  Right Atrium: Right atrial size was normal in size  Pericardium: There is no evidence of pericardial effusion. There is a moderate pleural effusion in both left and right lateral regions.  Mitral Valve: The mitral valve is abnormal. There is moderate thickening of the mitral valve leaflet(s). There is moderate calcification of the mitral valve leaflet(s). Moderate mitral annular calcification. Mild to moderate mitral valve regurgitation.  Tricuspid Valve: The tricuspid valve is not well visualized. Tricuspid valve regurgitation is trivial by color flow Doppler.  Aortic Valve: The aortic valve is tricuspid. Aortic valve regurgitation is mild by color flow Doppler. Aortic regurgitation PHT measures 382 msec.  Pulmonic Valve: The pulmonic valve was grossly normal. Pulmonic valve regurgitation is not visualized by color flow Doppler.  Aorta: Aortic dilatation noted.  Venous: The inferior vena cava is dilated in size with less than 50% respiratory variability, suggesting right atrial pressure of 15 mmHg.  IAS/Shunts: The interatrial septum was not well visualized.     LEFT VENTRICLE PLAX 2D LVIDd:         2.90 cm       Diastology LVIDs:         2.40 cm       LV e' lateral:   7.94 cm/s LV PW:         1.40 cm       LV E/e' lateral: 15.0 LV IVS:        1.47 cm       LV e' medial:    4.79 cm/s LVOT diam:     1.90 cm       LV E/e' medial:  24.8 LV SV:         12 ml LV SV Index:   7.07 LVOT Area:     2.84 cm   LV Volumes (MOD) LV area d, A2C:    20.30 cm LV area d, A4C:    16.30 cm LV area s, A2C:    11.90 cm LV area s, A4C:    11.10 cm LV major d, A2C:   6.48 cm LV major d, A4C:   6.10 cm LV major s, A2C:   5.41 cm LV major s, A4C:   5.85 cm LV vol d, MOD A2C: 51.5 ml LV vol d, MOD A4C: 35.7 ml LV vol s, MOD A2C: 22.5 ml LV vol s, MOD A4C: 17.5 ml LV SV MOD  A2C:     29.0 ml LV SV MOD A4C:     35.7 ml LV SV MOD BP:      23.6 ml  RIGHT VENTRICLE             IVC RV S prime:     10.20 cm/s  IVC diam: 2.30 cm TAPSE (M-mode): 1.2 cm  LEFT ATRIUM             Index       RIGHT ATRIUM           Index LA diam:        3.70 cm 2.20 cm/m  RA Area:     11.00 cm LA Vol (A2C):   49.9 ml 29.66 ml/m RA Volume:   26.20 ml  15.57 ml/m LA Vol (A4C):   38.4 ml 22.83 ml/m LA Biplane Vol: 45.1 ml 26.81 ml/m  AORTIC VALVE LVOT Vmax:   125.00 cm/s  LVOT Vmean:  72.800 cm/s LVOT VTI:    0.197 m AI PHT:      382 msec   AORTA Ao Root diam: 3.10 cm Ao Asc diam:  3.80 cm  MITRAL VALVE MV Area (PHT): 5.26 cm             SHUNTS MV PHT:        41.86 msec           Systemic VTI:  0.20 m MV Decel Time: 144 msec             Systemic Diam: 1.90 cm MV E velocity: 119.00 cm/s 103 cm/s    Lyman Bishop MD Electronically signed by Lyman Bishop MD Signature Date/Time: 08/26/2019/2:28:06 PM       Final     *Note: Due to a large number of results and/or encounters for the requested time period, some results have not been displayed. A complete set of results can be found in Results Review.   ECG - SR rate 120 BPM. (See cardiology reading for complete details)  PHYSICAL EXAM Blood pressure 100/60, pulse (!) 103, temperature 99.7 F (37.6 C), temperature source Bladder, resp. rate 18, height _0  (1.626 m), weight 68.3 kg, SpO2 99 %. Pleasant elderly Caucasian lady who is intubated but not sedated alert, mild respiratory distress HEENT-  normocephalis   Lungs- Intubated, vented Extremities- Warm and well perfused  Neurological Examination Mental Status: Alert, attends, tracks, follows all commands.Unable to speak d/t ETT Cranial Nerves: II: bliks and tracks in all quadrants. PERRL.  III,IV, VI: No ptosis. Eyes conjugate at the midline.  V,VII: Intubated with face grossly symmetric. Intact facial sensation.  VIII: hearing intact to voice IX,X:  Intubated XI: No asymmetry XII: Intubated, but able to stick tongue out under tube Motor: moving all ext, some mild weakness in legs and RLE is limited d/t pain.  Sensory: intact to LT throughout Deep Tendon Reflexes: 3+ bilateral brachioradialis and biceps. 2+ patellae bilaterally. Toes upgoing bilaterally  Cerebellar/Gait: No gross ataxia noted  HOME MEDICATIONS:  Medications Prior to Admission  Medication Sig Dispense Refill  . acetaminophen (TYLENOL) 500 MG tablet May take 1 tablet (500 mg total) by mouth every 6 (six) hours as needed for mild pain. May also take 1 tablet (500 mg total) every 6 (six) hours as needed for mild pain. 30 tablet 0  . acidophilus (RISAQUAD) CAPS capsule Take 1 capsule by mouth daily.    Marland Kitchen amiodarone (PACERONE) 200 MG tablet Take 1 tablet (200 mg total) by mouth daily. 30 tablet 6  . aspirin EC 81 MG EC tablet Take 1 tablet (81 mg total) by mouth daily.    Marland Kitchen atorvastatin (LIPITOR) 40 MG tablet Take 1 tablet (40 mg total) by mouth daily at 6 PM. 30 tablet 0  . Coenzyme Q10-Vitamin E (QUNOL ULTRA COQ10 PO) Take by mouth.    . furosemide (LASIX) 40 MG tablet Take 1 tablet (40 mg total) by mouth 2 (two) times daily. (Patient taking differently: Take 20 mg by mouth daily. ) 60 tablet 11  . metoprolol tartrate (LOPRESSOR) 25 MG tablet Take 0.5 tablets (12.5 mg total) by mouth 2 (two) times daily. 60 tablet 1  . potassium chloride (K-DUR) 10 MEQ tablet Take 1 tablet (10 mEq total) by mouth 2 (two) times daily. 60 tablet 0  . psyllium (METAMUCIL) 58.6 % powder Take 1 packet by mouth 2 (two) times daily as needed (constipation).    . temazepam (RESTORIL) 15 MG  capsule Take 1 capsule (15 mg total) by mouth at bedtime as needed for sleep. 30 capsule 0  . traMADol (ULTRAM) 50 MG tablet 1/2 - 1 tablet q 6-8 hours prn pain 15 tablet 0  . Turmeric 500 MG CAPS Take 500 mg by mouth daily.    Marland Kitchen apixaban (ELIQUIS) 5 MG TABS tablet Take 1 tablet (5 mg total) by mouth 2 (two) times  daily. 60 tablet 1  . spironolactone (ALDACTONE) 25 MG tablet Take 1 tablet (25 mg total) by mouth daily. 15 tablet 6      HOSPITAL MEDICATIONS:  . aspirin  81 mg Per Tube Daily  . atorvastatin  40 mg Oral q1800  . chlorhexidine gluconate (MEDLINE KIT)  15 mL Mouth Rinse BID  . Chlorhexidine Gluconate Cloth  6 each Topical Daily  . etomidate  20 mg Intravenous Once  . famotidine  20 mg Per Tube Daily  . feeding supplement (PRO-STAT SUGAR FREE 64)  30 mL Per Tube BID  . feeding supplement (VITAL HIGH PROTEIN)  1,000 mL Per Tube Q24H  . insulin aspart  0-9 Units Subcutaneous Q4H  . mouth rinse  15 mL Mouth Rinse 10 times per day  . metoprolol tartrate  25 mg Per Tube BID  . nitroGLYCERIN  0.4 mg Transdermal Daily  . spironolactone  12.5 mg Per Tube Daily  . succinylcholine  100 mg Intravenous Once   ASSESSMENT/PLAN Ms. Lacey Coleman is a 85 y.o. female with history of  CAD, CABG, CHF (last ECHO 25-30% with diffuse hypokinesis), HTN, A-fib (previously on Eliquis but stopped after a fall on 10/5) and RA who presented to the ED on 10/23 after being found unresponsive in her chair at home by her family. Neurology consulted after neuro imaging showed stroke.   Stroke: subcortical left frontal lobe infarct. small vessel disease likely  Resultant do not see any lasting clinical effects. Presenting AMS is likely d/t hypoxia  Code Stroke CT Head -  Not done  CT head- revealing bilateral frontal white matter hypodense lesions, new since the prior CT of 08/07/2019 and concerning for areas of subacute or acute infarct.   MRI head- 18 mm focus of diffusion abnormality involving the subcortical left frontal lobe, most consistent with evolving late subacute ischemic infarct.  CTA H&N   CT Perfusion- not done  2D Echo - pending; Previous TEE showed:  25-30% with diffuse hypokinesis  [employer] Virus 2  neg  LDL -42    Component Value Date/Time   LDLCALC 42 08/28/2019 0830    HgbA1c -  5.5  UDS not done  VTE prophylaxis - heparin SQ Diet  Diet Order            Diet NPO time specified  Diet effective midnight        Diet NPO time specified  Diet effective now              Eliquis (until this month when stopped d/t fall, then started ASA prior to admission, now on ASA only  Patient counseled to be compliant with her antithrombotic medications  Ongoing aggressive stroke risk factor management  Therapy recommendations:  pending  Disposition:  Pending  Hypertension  Home BP meds: Amiodarone, Lopressor, Lasix, Aldactone  Current BP meds: none; she has been hypotensive  Stable . Permissive hypertension (OK if < 220/120) but gradually normalize in 5-7 days . Long-term BP goal normotensive  Hyperlipidemia  Home Lipid lowering medication: Lipitor 41m  LDL 42, goal <  70  Current lipid lowering medication: Lipitor 12m  Continue statin at discharge  Diabetes  Home diabetic meds:none  NO hx of DM2  HgbA1c 5.6, goal < 7.0 Recent Labs    08/30/19 0414 08/30/19 0815 08/30/19 1141  GLUCAP 119* 157* 159*    Other Stroke Risk Factors  Advanced age  Coronary artery disease and HF   Atrial fibrillation  Other Active Problems  Acute Hypoxic Respiratory Failure- intubated, vented  Recent CABG  Heart Failure (last echo showed 25-30% with diffuse hypokinesis)  Encephalopathy/unresponsive spell- most likely d/t hypoxia, not this stroke  Hospital day # 4    She presented with chest discomfort and respiratory difficulties likely due to heart failure and MRI shows small left subcortical infarct from small vessel disease.  Recommend continue respiratory support and cardiac care as per critical care team.  From neurological standpoint she can be extubated when ready.  Expect good functional improvement with therapies.  Resume Eliquis when she is able to swallow   Continue ongoing aggressive risk factor modification.  Discussed with Dr. ALynetta Mare critical care MD and patient's daughter at the bedside and answered questions stroke team will sign off.  Kindly call for questions if any. This patient is critically ill and at significant risk of neurological worsening, death and care requires constant monitoring of vital signs, hemodynamics,respiratory and cardiac monitoring, extensive review of multiple databases, frequent neurological assessment, discussion with family, other specialists and medical decision making of high complexity.I have made any additions or clarifications directly to the above note.This critical care time does not reflect procedure time, or teaching time or supervisory time of PA/NP/Med Resident etc but could involve care discussion time.  I spent 30 minutes of neurocritical care time  in the care of  this patient.    PAntony Contras MD Medical Director [facility] Pager: [phone]/28/2020 3:56 PM  To contact Stroke Continuity provider, please refer to [email or web address] After hours, contact General Neurology

## 2019-08-30 NOTE — Discharge Instructions (Signed)
AIDS and the Nervous System If you have been diagnosed with AIDS (acquired immunodeficiency syndrome), you need to know about the effects that AIDS can have on your nervous system. AIDS can damage both parts of the nervous system, which are:  The central nervous system (CNS). This consists of your brain and spinal cord.  The peripheral nervous system (PNS). This includes the nerves that go out to your body parts and return signals to your brain and spinal cord. How can AIDS affect my brain, spinal cord, and nerves? The virus that causes AIDS (HIV, human immunodeficiency virus) does not directly invade the cells of your nervous system. However, it causes inflammation and can lead to conditions that directly affect the nervous system and result in:  Damage to your brain and spinal cord (CNS).  Damage to the nerves that go to and from your brain and spinal cord (PNS). Other possible causes of nervous system damage include:  Medicines that you take to treat AIDS.  Infections and cancers that are more common in people who have AIDS. What are the signs and symptoms of nervous system damage? Signs and symptoms of nervous system damage depend on where the damage is and what caused it. Damage to your PNS is called peripheral neuropathy.  Signs and symptoms of peripheral neuropathy include: ? Pain. ? Numbness. ? Burning or tingling. ? Weakness. ? Increased sensation.  Signs and symptoms of CNS damage include: ? Headache. ? Sudden or gradual weakness. ? Slurred speech or loss of vision. ? Dizziness. ? Nausea and vomiting. ? Clumsiness and loss of balance. ? Fevers and neck stiffness. ? Inability to think clearly or quickly (cognitive dysfunction). ? Changes in behavior. ? Anxiety or depression. How are these nervous system problems treated? There is not one single treatment for neurological problems. Treatment depends on the type of problem and its cause. Treatment may  include:  Medicines: ? High doses of AIDS medicines (anti-retroviral therapy) may help to reduce many neurological problems. ? Antibiotics to treat infections. ? Chemotherapy to treat cancers that are caused by AIDS or occur with AIDS. ? Strong anti-inflammatory medicines (corticosteroids). ? Pain medicine, including over-the-counter and prescription-strength medicines. ? Mental health medicines, including antidepressants, stimulants, and sedatives.  Therapy: ? Talk therapy with a mental health professional. ? Physical therapy to treat weakness and balance problems. It is also important to:  Take over-the-counter and prescription medicines only as told by your health care provider.  Tell your health care provider if you develop changes in your thinking, behavior, or movement.  Keep all follow-up visits as told by your health care provider. This is important. Where to find more information  For general information about HIV/AIDS, please visit [email or web address].[facility].org/ Summary  AIDS can affect many systems in the body, including the nervous system. This system includes the brain and spinal cord (central nervous system, CNS) and the nerves that go out to your body parts and return signals to your CNS (peripheral nervous system, PNS).  AIDS usually causes injury by allowing infections or cancers to develop in the brain, spine, or nerves. Some medicines that are used to treat AIDS can also cause injury to the nervous system.  Neurological problems of AIDS are treated in many ways, including using AIDS medicines, pain medicines, antibiotics, or chemotherapy.  If you believe you have a neurological problem related to AIDS, contact your health care provider immediately. Some of these problems are serious or life-threatening and require immediate treatment. This information is not intended to  replace advice given to you by your health care provider. Make sure you discuss any questions you have with your  health care provider. Document Released: 10/09/2002 Document Revised: 02/07/2019 Document Reviewed: 06/11/2017 Elsevier Patient Education  2020 [employer].

## 2019-08-30 NOTE — Progress Notes (Signed)
Advanced Heart Failure Rounding Note   Subjective:    Remains on vent at 50% FiO2. Awake and following commands.   Diuresing sluggishly on IV lasix. Weight down only 2 pounds. Remains in AFL with RVR  CXR mildly improved. Still with significant effusions. Renal function stable   Objective:   Weight Range:  Vital Signs:   Temp:  [97.4 F (36.3 C)-99.5 F (37.5 C)] 97.5 F (36.4 C) (10/28 0400) Pulse Rate:  [31-139] 129 (10/28 0921) Resp:  [17-34] 20 (10/28 0921) BP: (89-188)/(52-99) 107/69 (10/28 0921) SpO2:  [93 %-100 %] 96 % (10/28 0921) FiO2 (%):  [40 %] 40 % (10/28 0826) Weight:  [68.3 kg] 68.3 kg (10/28 0429) Last BM Date: (PTA)  Weight change: Filed Weights   08/28/19 0350 08/29/19 0500 08/30/19 0429  Weight: 68.2 kg 69.1 kg 68.3 kg    Intake/Output:   Intake/Output Summary (Last 24 hours) at 08/30/2019 0959 Last data filed at 08/30/2019 0930 Gross per 24 hour  Intake 1980.57 ml  Output 2660 ml  Net -679.43 ml     Physical Exam: General:  On vent awake following comands HEENT: normal Neck: supple. JVP appears elevated . Carotids 2+ bilat; no bruits. No lymphadenopathy or thryomegaly appreciated. Cor: PMI nondisplaced. Irregular tachy Lungs: decreased at bases Abdomen: soft, nontender, nondistended. No hepatosplenomegaly. No bruits or masses. Good bowel sounds. Extremities: no cyanosis, clubbing, rash, tr edema Neuro: alert & orientedx3, cranial nerves grossly intact. moves all 4 extremities w/o difficulty. Affect pleasant  Telemetry: AFL 110-120s Personally reviewed   Labs: Basic Metabolic Panel: Recent Labs  Lab 08/26/19 0154 08/26/19 0441 08/26/19 1305 08/26/19 1837 08/28/19 0830 08/29/19 1153 08/30/19 0320  NA 125*  --  126* 128* 131* 134* 134*  K 5.1  --  4.2 4.4 4.8 4.3 3.8  CL 96*  --   --  99 101 104 100  CO2 16*  --   --  19* _0 GLUCOSE 144*  --   --  117* 119* 161* 144*  BUN 15  --   --  14 26* 31* 43*  CREATININE  0.62  --   --  0.69 0.61 0.52 0.75  CALCIUM 9.0  --   --  9.4 9.7 9.9 9.9  MG 1.8 1.8  --   --   --   --   --   PHOS 4.8* 4.0  --   --   --   --   --     Liver Function Tests: Recent Labs  Lab 08/26/19 0441  AST 28  ALT 21  ALKPHOS 110  BILITOT 1.5*  PROT 4.8*  ALBUMIN 2.5*   No results for input(s): LIPASE, AMYLASE in the last 168 hours. Recent Labs  Lab 08/26/19 0441  AMMONIA 15    CBC: Recent Labs  Lab 08/26/19 0011 08/26/19 0441 08/26/19 1305 08/28/19 0830 08/30/19 0320  WBC 19.8* 18.4*  --  12.1* 10.1  NEUTROABS 16.1*  --   --  8.0*  --   HGB 11.7* 9.9* 10.5* 9.1* 9.2*  HCT 39.3 32.2* 31.0* 31.0* 32.1*  MCV 75.3* 72.7*  --  75.6* 76.4*  PLT 464* 292  --  320 303    Cardiac Enzymes: No results for input(s): CKTOTAL, CKMB, CKMBINDEX, TROPONINI in the last 168 hours.  BNP: BNP (last 3 results) Recent Labs    07/16/19 0141 08/26/19 0441  BNP 2,027.0* 497.7*    ProBNP (last 3 results) No results for input(s):  PROBNP in the last 8760 hours.    Other results:  Imaging: Dg Chest Port 1 View  Result Date: 08/30/2019 CLINICAL DATA:  CHF and pleural effusions. EXAM: PORTABLE CHEST 1 VIEW COMPARISON:  08/28/2019 FINDINGS: Endotracheal tube remains with the tip approximately 1 cm above the carina. Gastric decompression tube extends below the diaphragm. Some improved aeration noted at both lung bases with moderate bilateral pleural effusions remaining. Mild interstitial edema remains. The heart size is stable. No pneumothorax. IMPRESSION: 1. Some improved aeration at both lung bases with moderate bilateral pleural effusions remaining. Mild interstitial edema remains. 2. Endotracheal tube tip remains approximately 1 cm above the carina. Electronically Signed   By: Aletta Edouard M.D.   On: 08/30/2019 08:57   Dg Chest Port 1 View  Result Date: 08/28/2019 CLINICAL DATA:  Altered mental status which shortness-of-breath. EXAM: PORTABLE CHEST 1 VIEW COMPARISON:   08/26/2019 FINDINGS: Patient is rotated to the right. Endotracheal tube has tip 2.4 cm above the carina. Enteric tube courses into the region of the stomach and off the film as tip is not visualized. There is evidence of moderate size bilateral pleural effusions likely with associated bibasilar atelectasis as the right effusion is worse and left effusion not significantly changed. Remainder of the exam is unchanged. IMPRESSION: Continued moderate size bilateral pleural effusions with interval worsening of the right effusion and stable left effusion. Likely associated bibasilar atelectasis. Tubes and lines as described. Electronically Signed   By: Marin Olp M.D.   On: 08/28/2019 11:29   Vas [place] Transcranial Doppler  Result Date: 08/28/2019  Transcranial Doppler Indications: Stroke. Limitations for diagnostic windows: Unable to insonate right transtemporal window. Unable to insonate left transtemporal window. Comparison Study: No prior. Performing Technologist: Oda Cogan RDMS, RVT  Examination Guidelines: A complete evaluation includes B-mode imaging, spectral Doppler, color Doppler, and power Doppler as needed of all accessible portions of each vessel. Bilateral testing is considered an integral part of a complete examination. Limited examinations for reoccurring indications may be performed as noted.  +----------+-------------+----------+-----------+-------+ RIGHT TCD Right VM (cm)Depth (cm)PulsatilityComment +----------+-------------+----------+-----------+-------+ Opthalmic     19.00                                 +----------+-------------+----------+-----------+-------+ ICA siphon    34.00                                 +----------+-------------+----------+-----------+-------+ Vertebral    -22.00                                 +----------+-------------+----------+-----------+-------+  +----------+------------+----------+-----------+-------+ LEFT TCD  Left VM (cm)Depth  (cm)PulsatilityComment +----------+------------+----------+-----------+-------+ Opthalmic    24.00                                 +----------+------------+----------+-----------+-------+ ICA siphon   63.00                                 +----------+------------+----------+-----------+-------+ Vertebral    -21.00                                +----------+------------+----------+-----------+-------+  +------------+-------+------------+  VM cm/s  Comment    +------------+-------+------------+ Prox Basilar       no insonated +------------+-------+------------+ Summary:  Absent bitemporal and ppor suboccipital window limits evaluation. Normal blood flow directions in both opthalmics,carotid siphons and vertebral arteries. *See table(s) above for measurements and observations.  Diagnosing physician: Antony Contras MD Electronically signed by Antony Contras MD on 08/28/2019 at 3:13:12 PM.    Final       Medications:     Scheduled Medications: . aspirin  81 mg Per Tube Daily  . atorvastatin  40 mg Oral q1800  . chlorhexidine gluconate (MEDLINE KIT)  15 mL Mouth Rinse BID  . Chlorhexidine Gluconate Cloth  6 each Topical Daily  . etomidate  20 mg Intravenous Once  . famotidine  20 mg Per Tube Daily  . feeding supplement (PRO-STAT SUGAR FREE 64)  30 mL Per Tube BID  . feeding supplement (VITAL HIGH PROTEIN)  1,000 mL Per Tube Q24H  . insulin aspart  0-9 Units Subcutaneous Q4H  . mouth rinse  15 mL Mouth Rinse 10 times per day  . metolazone  5 mg Per Tube Once  . metoprolol tartrate  25 mg Per Tube BID  . nitroGLYCERIN  0.4 mg Transdermal Daily  . potassium chloride  40 mEq Per Tube Once  . spironolactone  12.5 mg Per Tube Daily  . succinylcholine  100 mg Intravenous Once     Infusions: . amiodarone 30 mg/hr (08/30/19 0900)  . diltiazem (CARDIZEM) infusion    . fentaNYL infusion INTRAVENOUS 50 mcg/hr (08/29/19 1400)  . furosemide (LASIX) infusion    .  heparin 1,050 Units/hr (08/30/19 0900)     PRN Medications:  acetaminophen, ipratropium-albuterol, ondansetron (ZOFRAN) IV   Assessment/Plan:    1. Acute on chronic diastolic HF - Echo EF 47-65% - CXR still wet with bilateral effusions. Not hugh response to lasix 80 bid. - Start lasix gtt at 15. Add metolazone and spiro - If limited response may need effusions tapped  2. Acute Hypoxic Respiratory Failure - due to CHF - see plan as above  3. Paroxysmal AFL with RVR - new. Unclear duration. Suspect ~ 1 week - continue amio and heparin. Likely TEE/DC-CV tomorrow. (please leave intubated overnight) - D/w CCM and PharmD personally  4. Pleural effusions - follow with diuresis.  -  May need tap if not improving  5. CAD s/p CABG - no s/s ischemia. Continue ASA/statin  6. AMS - likely due to hypoxia - doubt CVA major issue here - resolved  7. Subacute CVA - Neuro has seen   CRITICAL CARE Performed by: Glori Bickers  Total critical care time: 35 minutes  Critical care time was exclusive of separately billable procedures and treating other patients.  Critical care was necessary to treat or prevent imminent or life-threatening deterioration.  Critical care was time spent personally by me (independent of midlevel providers or residents) on the following activities: development of treatment plan with patient and/or surrogate as well as nursing, discussions with consultants, evaluation of patient's response to treatment, examination of patient, obtaining history from patient or surrogate, ordering and performing treatments and interventions, ordering and review of laboratory studies, ordering and review of radiographic studies, pulse oximetry and re-evaluation of patient's condition.    Length of Stay: 4   Glori Bickers MD 08/30/2019, 9:59 AM  Advanced Heart Failure Team Pager [phone] (M-F; [place])  Please contact [facility] for night-coverage after  hours (4p -7a ) and weekends on [email or web address]

## 2019-08-30 NOTE — Progress Notes (Addendum)
NAME:  Lacey Coleman, MRN:  [id number], DOB:  1931-07-08, LOS: 4 ADMISSION DATE:  22-Sep-2019, CONSULTATION DATE: 08/26/2019 REFERRING MD: Dr. Elesa Massed, CHIEF COMPLAINT: Altered mental status  Brief History   85 year old lady found unresponsive at home, upon arrival by EMS, intubated.  History of present illness   History obtained from chart  85 yo F PMH CAD s/p CABG in 08/2019, CHF, HTN, RA, A fib (eliquis dc 1 week ago) who presents 10/23 with AMS. Patient was found unresponsive at home in chair, found by family. Upon EMS arrival, SpO2 30%.  EMS reported ability to follow commands en route after being placed on NRB; EMS reports degree of aphagia. In ED, patient not following commands, and is subsequently intubated due to hypoxia. Concern for CVA due to AMS; CT  H obtained and is concerning for acute vs subacute infarct. Neurology informally consulted by EDP and will see if MRI brain reveals acute infarct. Patient determined not to be candidate for tPA.   Per patient's daughter, patient experienced a fall during hospitalization approx 1 month ago and has been weak since discharge home.   Lactic acid 2.5, WBC 19.8, ABG 7.28/51/133. BMP pendng  COVID-19 neg Started of vanc and cefepime in ED   Past Medical History   Past Medical History:  Diagnosis Date  . CAD (coronary artery disease)    9/14 LHC with EF 25-35%, mod elevated LVEDP, dLM 90%, pLCx80%, pLAD to mLAD 90%, 1st diag 80%, mid Cx 100%, pRCA 90%, pRCA to mRCA 100%  . Heart failure with reduced ejection fraction (HCC)    a) 9/13 echo with EF 30-35%, mild LVF, diffuse hypokinesis, mod aortic calcification, mild AR  . Hypertension   . Rheumatoid arthritis (HCC)      Significant Hospital Events   Altered mental status Intubated for airway protection CT reveals acute versus subacute infarct MRI did reveal a stroke-subacute  Consults:  Neurology Cardiology.  Procedures:  10/24 endotracheal tube>>  Significant Diagnostic  Tests:  10/24 CXR> bilateral pleural effusions, bibasilar atelectasis vs infiltrate   10/24 CT H non con> 1. No acute intracranial hemorrhage. 2. Mild age-related atrophy and chronic microvascular ischemic changes. 3. Bilateral frontal white matter hypodense lesions, new since theprior CT of 08/07/2019 and concerning for areas of subacute or acute infarct. Clinical correlation is recommended. MRI may provide betterevaluation if clinically indicated.  10/24 MRI brain IMPRESSION: 1. 18 mm focus of diffusion abnormality involving the subcortical left frontal lobe, most consistent with evolving late subacute ischemic infarct. No associated hemorrhage or mass effect. 2. No other acute intracranial abnormality. 3. Underlying atrophy with mild chronic microvascular ischemic disease.  Micro Data:  10/24 SARS CoV2> neg   Antimicrobials:  10/24 vanc>  10/24 cefepime>   Interim history/subjective:  Intermittently follows commands. Immediately failed SBT this morning again with PSV 8/5  Objective   Blood pressure 95/69, pulse 94, temperature 99.7 F (37.6 C), temperature source Bladder, resp. rate 17, height 5\' 4"  (1.626 m), weight 68.3 kg, SpO2 99 %.    Vent Mode: PRVC FiO2 (%):  [40 %] 40 % Set Rate:  [18 bmp] 18 bmp Vt Set:  [440 mL] 440 mL PEEP:  [5 cmH20] 5 cmH20 Plateau Pressure:  [21 cmH20-24 cmH20] 22 cmH20   Intake/Output Summary (Last 24 hours) at 08/30/2019 1109 Last data filed at 08/30/2019 0930 Gross per 24 hour  Intake 1794.53 ml  Output 1785 ml  Net 9.53 ml   Filed Weights   08/28/19 0350 08/29/19  0500 08/30/19 0429  Weight: 68.2 kg 69.1 kg 68.3 kg    Examination: General: Elderly, frail, intubated HENT: Moist oral mucosa Lungs: Diffuse wheezing following attempted SBT Cardiovascular: JVP is elevated, apex beat is displaced and sustained heart sounds are distant. Abdomen: Bowel sounds appreciated Extremities: Lower extremity edema Neuro: Moving  extremities GU: Fair output  While in distress this morning I performed a CCM POC echo which showed no significant change in LV function or degree of MR.  Marked interstitial pattern on L side consistent with pulmonary edema. Large R sided effusion  Resolved Hospital Problem list    Assessment & Plan:   Acute encephalopathy CVA on MRI appear subacute -Neurology did see patient - no intervention required at this time apart for secondary stroke prevention.  Critically ill due to acute hypoxemic respiratory failure Secondary to heart failure as cultures and procalcitonin negative Failed SBT again and will likely restoration of SR before she is able to tolerate extubation. -Stopped antibiotics - Intensifying diuresis with IV furosemide infusion.   Heart failure with reduced ejection fraction Repeat echocardiogram did show improvement in ejection fraction, but was performed with patient on full ventilatory support. Significant wall motion abnormalities -Reintroduced home heart failure therapy -Added topical nitrates. -Dr. Haroldine Laws following  Right pleural effusion - Plan possible thoracentesis as effusion is substantial and likely contributing to inability to separate from mechanical ventilation  Critically ill due atrial fibrillation with rapid ventricular response. Loss of atrial kick contributing to pulmonary edema in context of pre-existing diastolic dysfunction.  -Continue amiodarone and metoprolol -Plan for TEE guided cardioversion tomorrow.  Asymptomatic bactiuria. E coli present but urinalysis negative. -Stop antibiotics.  Best practice:  Diet: Continue tube feeds Pain/Anxiety/Delirium protocol (if indicated): Fentanyl infusion and prn fentanyl VAP protocol (if indicated): In place DVT prophylaxis: Heparin IV  GI prophylaxis: Protonix Glucose control: Insulin SSC Mobility: Bedrest Code Status: Partial code Family Communication: Discussed with daughter at bedside  10/26 2020 Disposition: ICU  Labs   CBC: Recent Labs  Lab 08/26/19 0011 08/26/19 0441 08/26/19 1305 08/28/19 0830 08/30/19 0320  WBC 19.8* 18.4*  --  12.1* 10.1  NEUTROABS 16.1*  --   --  8.0*  --   HGB 11.7* 9.9* 10.5* 9.1* 9.2*  HCT 39.3 32.2* 31.0* 31.0* 32.1*  MCV 75.3* 72.7*  --  75.6* 76.4*  PLT 464* 292  --  320 409    Basic Metabolic Panel: Recent Labs  Lab 08/26/19 0154 08/26/19 0441 08/26/19 1305 08/26/19 1837 08/28/19 0830 08/29/19 1153 08/30/19 0320  NA 125*  --  126* 128* 131* 134* 134*  K 5.1  --  4.2 4.4 4.8 4.3 3.8  CL 96*  --   --  99 101 104 100  CO2 16*  --   --  19* 23 24 27   GLUCOSE 144*  --   --  117* 119* 161* 144*  BUN 15  --   --  14 26* 31* 43*  CREATININE 0.62  --   --  0.69 0.61 0.52 0.75  CALCIUM 9.0  --   --  9.4 9.7 9.9 9.9  MG 1.8 1.8  --   --   --   --   --   PHOS 4.8* 4.0  --   --   --   --   --    GFR: Estimated Creatinine Clearance: 47.9 mL/min (by C-G formula based on SCr of 0.75 mg/dL). Recent Labs  Lab 08/26/19 0011 08/26/19 0154 08/26/19 0441 08/28/19  0830 08/30/19 0320  PROCALCITON  --  <0.10  --   --   --   WBC 19.8*  --  18.4* 12.1* 10.1  LATICACIDVEN 2.5*  --  2.3*  --   --     Liver Function Tests: Recent Labs  Lab 08/26/19 0441  AST 28  ALT 21  ALKPHOS 110  BILITOT 1.5*  PROT 4.8*  ALBUMIN 2.5*   No results for input(s): LIPASE, AMYLASE in the last 168 hours. Recent Labs  Lab 08/26/19 0441  AMMONIA 15    ABG    Component Value Date/Time   PHART 7.443 08/26/2019 1305   PCO2ART 29.5 (L) 08/26/2019 1305   PO2ART 213.0 (H) 08/26/2019 1305   HCO3 20.2 08/26/2019 1305   TCO2 21 (L) 08/26/2019 1305   ACIDBASEDEF 3.0 (H) 08/26/2019 1305   O2SAT 100.0 08/26/2019 1305      CRITICAL CARE Performed by: Lynnell Catalan   Total critical care time: 50 minutes  Critical care time was exclusive of separately billable procedures and treating other patients.  Critical care was necessary to treat or  prevent imminent or life-threatening deterioration.  Critical care was time spent personally by me on the following activities: development of treatment plan with patient and/or surrogate as well as nursing, discussions with consultants, evaluation of patient's response to treatment, examination of patient, obtaining history from patient or surrogate, ordering and performing treatments and interventions, ordering and review of laboratory studies, ordering and review of radiographic studies, pulse oximetry, re-evaluation of patient's condition and participation in multidisciplinary rounds.  Lynnell Catalan, MD [facility] ICU Physician [facility]  Pager: [phone] Mobile: [phone] After hours: [phone].

## 2019-08-31 ENCOUNTER — Inpatient Hospital Stay (HOSPITAL_COMMUNITY): Payer: Medicare Other

## 2019-08-31 DIAGNOSIS — J9601 Acute respiratory failure with hypoxia: Secondary | ICD-10-CM | POA: Diagnosis not present

## 2019-08-31 DIAGNOSIS — I480 Paroxysmal atrial fibrillation: Secondary | ICD-10-CM

## 2019-08-31 LAB — CULTURE, RESPIRATORY W GRAM STAIN

## 2019-08-31 LAB — CBC
HCT: 28.1 % — ABNORMAL LOW (ref 36.0–46.0)
Hemoglobin: 8.6 g/dL — ABNORMAL LOW (ref 12.0–15.0)
MCH: 22.1 pg — ABNORMAL LOW (ref 26.0–34.0)
MCHC: 30.6 g/dL (ref 30.0–36.0)
MCV: 72.2 fL — ABNORMAL LOW (ref 80.0–100.0)
Platelets: 295 10*3/uL (ref 150–400)
RBC: 3.89 MIL/uL (ref 3.87–5.11)
RDW: 17.4 % — ABNORMAL HIGH (ref 11.5–15.5)
WBC: 12.5 10*3/uL — ABNORMAL HIGH (ref 4.0–10.5)
nRBC: 0.2 % (ref 0.0–0.2)

## 2019-08-31 LAB — BASIC METABOLIC PANEL
Anion gap: 13 (ref 5–15)
BUN: 62 mg/dL — ABNORMAL HIGH (ref 8–23)
CO2: 23 mmol/L (ref 22–32)
Calcium: 10 mg/dL (ref 8.9–10.3)
Chloride: 99 mmol/L (ref 98–111)
Creatinine, Ser: 0.99 mg/dL (ref 0.44–1.00)
GFR calc Af Amer: 60 mL/min — ABNORMAL LOW (ref >60)
GFR calc non Af Amer: 52 mL/min — ABNORMAL LOW (ref >60)
Glucose, Bld: 149 mg/dL — ABNORMAL HIGH (ref 70–99)
Potassium: 4.1 mmol/L (ref 3.5–5.1)
Sodium: 135 mmol/L (ref 135–145)

## 2019-08-31 LAB — GLUCOSE, CAPILLARY
Glucose-Capillary: 143 mg/dL — ABNORMAL HIGH (ref 70–99)
Glucose-Capillary: 132 mg/dL — ABNORMAL HIGH (ref 70–99)
Glucose-Capillary: 120 mg/dL — ABNORMAL HIGH (ref 70–99)
Glucose-Capillary: 120 mg/dL — ABNORMAL HIGH (ref 70–99)
Glucose-Capillary: 144 mg/dL — ABNORMAL HIGH (ref 70–99)

## 2019-08-31 LAB — HEPARIN LEVEL (UNFRACTIONATED): Heparin Unfractionated: 0.35 IU/mL (ref 0.30–0.70)

## 2019-08-31 LAB — BODY FLUID CELL COUNT WITH DIFFERENTIAL
Lymphs, Fluid: 77 %
Monocyte-Macrophage-Serous Fluid: 5 % — ABNORMAL LOW (ref 50–90)
Neutrophil Count, Fluid: 18 % (ref 0–25)
Total Nucleated Cell Count, Fluid: 535 cu mm (ref 0–1000)

## 2019-08-31 LAB — CULTURE, BLOOD (ROUTINE X 2)
Culture: NO GROWTH
Culture: NO GROWTH

## 2019-08-31 LAB — GLUCOSE, PLEURAL OR PERITONEAL FLUID: Glucose, Fluid: 140 mg/dL

## 2019-08-31 LAB — TRIGLYCERIDES: Triglycerides: 51 mg/dL (ref <150)

## 2019-08-31 LAB — LACTATE DEHYDROGENASE, PLEURAL OR PERITONEAL FLUID: LD, Fluid: 94 U/L — ABNORMAL HIGH (ref 3–23)

## 2019-08-31 LAB — BRAIN NATRIURETIC PEPTIDE: B Natriuretic Peptide: 275.5 pg/mL — ABNORMAL HIGH (ref 0.0–100.0)

## 2019-08-31 MED ORDER — PREDNISONE 5 MG/5ML PO SOLN: 20.0000 mg | Freq: Every day | ORAL | Status: DC

## 2019-08-31 MED ORDER — PREDNISONE 5 MG/5ML PO SOLN
35.0000 mg | Freq: Every day | ORAL | Status: DC
  Administered 2019-09-01 – 2019-09-05 (×5): 35 mg
  Filled 2019-08-31 (×5): qty 35

## 2019-08-31 MED ORDER — METOLAZONE 5 MG PO TABS
5.0000 mg | ORAL_TABLET | Freq: Once | ORAL | Status: AC
  Administered 2019-08-31: 5 mg via ORAL
  Filled 2019-08-31: qty 1

## 2019-08-31 MED ORDER — COLCHICINE 0.6 MG PO TABS
0.6000 mg | ORAL_TABLET | Freq: Two times a day (BID) | ORAL | Status: DC
  Administered 2019-08-31 – 2019-09-05 (×11): 0.6 mg via ORAL
  Filled 2019-08-31 (×11): qty 1

## 2019-08-31 MED ORDER — PREDNISONE 5 MG/5ML PO SOLN: 10.0000 mg | Freq: Every day | ORAL | Status: DC

## 2019-08-31 MED ORDER — PREDNISONE 5 MG/5ML PO SOLN: 30.0000 mg | Freq: Every day | ORAL | Status: DC

## 2019-08-31 MED ORDER — MIDAZOLAM HCL 2 MG/2ML IJ SOLN
2.0000 mg | INTRAMUSCULAR | Status: DC | PRN
  Administered 2019-08-31 – 2019-09-02 (×3): 2 mg via INTRAVENOUS
  Administered 2019-09-02: 1 mg via INTRAVENOUS
  Administered 2019-09-03: 2 mg via INTRAVENOUS
  Filled 2019-08-31 (×4): qty 2

## 2019-08-31 MED ORDER — PREDNISONE 5 MG/5ML PO SOLN: 5.0000 mg | Freq: Every day | ORAL | Status: DC

## 2019-08-31 MED ORDER — QUETIAPINE FUMARATE 25 MG PO TABS
25.0000 mg | ORAL_TABLET | Freq: Every day | ORAL | Status: DC
  Administered 2019-08-31: 25 mg via ORAL
  Filled 2019-08-31: qty 1

## 2019-08-31 MED ORDER — HALOPERIDOL LACTATE 5 MG/ML IJ SOLN
5.0000 mg | Freq: Once | INTRAMUSCULAR | Status: AC
  Administered 2019-08-31: 5 mg via INTRAVENOUS
  Filled 2019-08-31: qty 1

## 2019-08-31 MED ORDER — MIDAZOLAM HCL 2 MG/2ML IJ SOLN
INTRAMUSCULAR | Status: AC
  Filled 2019-08-31: qty 2

## 2019-08-31 MED ORDER — PREDNISONE 5 MG/5ML PO SOLN: 25.0000 mg | Freq: Every day | ORAL | Status: DC

## 2019-08-31 MED ORDER — PREDNISONE 5 MG/5ML PO SOLN: 15.0000 mg | Freq: Every day | ORAL | Status: DC

## 2019-08-31 NOTE — Progress Notes (Signed)
ANTICOAGULATION CONSULT NOTE - Follow Up Consult  Pharmacy Consult for heparin Indication: atrial fibrillation  No Known Allergies  Patient Measurements: Height: _0  (162.6 cm) Weight: 150 lb 5.7 oz (68.2 kg) IBW/kg (Calculated) : 54.7 Heparin Dosing Weight: 63.6  Vital Signs: BP: 112/71 (10/29 0545) Pulse Rate: 115 (10/29 0545)  Labs: Recent Labs    08/28/19 0830 08/29/19 1153  08/30/19 0320 08/30/19 0953 08/30/19 1839 08/31/19 0251  HGB 9.1*  --   --  9.2*  --   --  8.6*  HCT 31.0*  --   --  32.1*  --   --  28.1*  PLT 320  --   --  303  --   --  295  APTT  --  28  --   --   --   --   --   HEPARINUNFRC  --  <0.10*   < >  --  0.47 0.44 0.35  CREATININE 0.61 0.52  --  0.75  --   --  0.99   < > = values in this interval not displayed.    Estimated Creatinine Clearance: 38.7 mL/min (by C-G formula based on SCr of 0.99 mg/dL).   Medical History: Past Medical History:  Diagnosis Date  . CAD (coronary artery disease)    9/14 LHC with EF 25-35%, mod elevated LVEDP, dLM 90%, pLCx80%, pLAD to mLAD 90%, 1st diag 80%, mid Cx 100%, pRCA 90%, pRCA to mRCA 100%  . Heart failure with reduced ejection fraction ([place])    a) 9/13 echo with EF 30-35%, mild LVF, diffuse hypokinesis, mod aortic calcification, mild AR  . Hypertension   . Rheumatoid arthritis (HCC)     Medications:  Scheduled:  . aspirin  81 mg Per Tube Daily  . atorvastatin  40 mg Oral q1800  . chlorhexidine gluconate (MEDLINE KIT)  15 mL Mouth Rinse BID  . Chlorhexidine Gluconate Cloth  6 each Topical Daily  . etomidate  20 mg Intravenous Once  . famotidine  20 mg Per Tube Daily  . insulin aspart  0-9 Units Subcutaneous Q4H  . mouth rinse  15 mL Mouth Rinse 10 times per day  . metoprolol tartrate  25 mg Per Tube BID  . midazolam      . nitroGLYCERIN  0.4 mg Transdermal Daily  . spironolactone  12.5 mg Per Tube Daily  . succinylcholine  100 mg Intravenous Once   Infusions:  . amiodarone 30 mg/hr (08/31/19  0600)  . diltiazem (CARDIZEM) infusion    . feeding supplement (VITAL AF 1.2 CAL) 1,000 mL (08/30/19 1649)  . fentaNYL infusion INTRAVENOUS 75 mcg/hr (08/30/19 1630)  . furosemide (LASIX) infusion 15 mg/hr (08/31/19 0600)  . heparin 1,050 Units/hr (08/31/19 0600)    Assessment: 85 yo F admitted for AMS, found unresponsive at home and intubated due to hypoxia. 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 Patient was started on apixaban for PAF but this was stopped on 10/16 after a fall on 10/5. Pharmacy consulted to initiate IV heparin for PAF with possible cardioversion this admission - plan for 10/29 AM while still intubated. Target lower heparin goals with possible stroke.  Heparin level is within goal range at 0.35. Hgb down slightly 8.6, pltc stable. No signs of bleeding.  Goal of Therapy:  Heparin level 0.3-0.5 units/ml aPTT 66-85 seconds Monitor platelets by anticoagulation protocol: Yes   Plan:  Continue IV heparin at 1050 units/hr  Daily heparin level, CBC Monitor for bleeding  Vertis Kelch, PharmD PGY2 Cardiology Pharmacy Resident Phone [phone] 08/31/2019       7:25 AM  Please check [email or web address] for unit-specific pharmacist phone numbers

## 2019-08-31 NOTE — Procedures (Addendum)
Thoracentesis Procedure Note  Pre-operative Diagnosis: right pleural effusion.  Post-operative Diagnosis: normal  Indications: failure to wean from mechanical ventilation. Bilateral pleural effusions R>>L  Procedure Details  Consent: Informed consent was obtained. Risks of the procedure were discussed including: infection, bleeding, pain, pneumothorax.  Under sterile conditions the patient was positioned. Chloraprep. solution and sterile drapes were utilized.  1% buffered lidocaine was used to anesthetize the 4th rib space. 1 L Fluid was obtained without any difficulties and minimal blood loss.  A dressing was applied to the wound and wound care instructions were provided. Ultrasound guidance was used.          Findings 60 ml of turbid bloody pleural fluid was obtained. A sample was sent to Pathology  and cell counts, as well as for infection analysis.  Complications:  None; patient tolerated the procedure well.          Condition: stable  Attending Attestation: I performed the procedure.  Kipp Brood, MD [facility] ICU Physician [place]  Pager: [phone] Mobile: [phone] After hours: [phone].  08/31/2019, 10:09 AM

## 2019-08-31 NOTE — Progress Notes (Addendum)
NAME:  Lacey Coleman, MRN:  [id number], DOB:  1931-10-26, LOS: 5 ADMISSION DATE:  08/04/2019, CONSULTATION DATE: 08/26/2019 REFERRING MD: Dr. Elesa Massed, CHIEF COMPLAINT: Altered mental status  Brief History   85 year old lady found unresponsive at home, upon arrival by EMS, intubated.  History of present illness   History obtained from chart  85 yo F PMH CAD s/p CABG in 08/2019, CHF, HTN, RA, A fib (eliquis dc 1 week ago) who presents 10/23 with AMS. Patient was found unresponsive at home in chair, found by family. Upon EMS arrival, SpO2 30%.  EMS reported ability to follow commands en route after being placed on NRB; EMS reports degree of aphagia. In ED, patient not following commands, and is subsequently intubated due to hypoxia. Concern for CVA due to AMS; CT  H obtained and is concerning for acute vs subacute infarct. Neurology informally consulted by EDP and will see if MRI brain reveals acute infarct. Patient determined not to be candidate for tPA.   Per patient's daughter, patient experienced a fall during hospitalization approx 1 month ago and has been weak since discharge home.   Lactic acid 2.5, WBC 19.8, ABG 7.28/51/133. BMP pendng  COVID-19 neg Started of vanc and cefepime in ED   Past Medical History   Past Medical History:  Diagnosis Date  . CAD (coronary artery disease)    9/14 LHC with EF 25-35%, mod elevated LVEDP, dLM 90%, pLCx80%, pLAD to mLAD 90%, 1st diag 80%, mid Cx 100%, pRCA 90%, pRCA to mRCA 100%  . Heart failure with reduced ejection fraction (HCC)    a) 9/13 echo with EF 30-35%, mild LVF, diffuse hypokinesis, mod aortic calcification, mild AR  . Hypertension   . Rheumatoid arthritis (HCC)      Significant Hospital Events   Altered mental status Intubated for airway protection CT reveals acute versus subacute infarct MRI did reveal a stroke-subacute  Consults:  Neurology Cardiology.  Procedures:  10/24 endotracheal tube>>  Significant Diagnostic  Tests:  10/24 CXR> bilateral pleural effusions, bibasilar atelectasis vs infiltrate   10/24 CT H non con> 1. No acute intracranial hemorrhage. 2. Mild age-related atrophy and chronic microvascular ischemic changes. 3. Bilateral frontal white matter hypodense lesions, new since theprior CT of 08/07/2019 and concerning for areas of subacute or acute infarct. Clinical correlation is recommended. MRI may provide betterevaluation if clinically indicated.  10/24 MRI brain IMPRESSION: 1. 18 mm focus of diffusion abnormality involving the subcortical left frontal lobe, most consistent with evolving late subacute ischemic infarct. No associated hemorrhage or mass effect. 2. No other acute intracranial abnormality. 3. Underlying atrophy with mild chronic microvascular ischemic disease.  Micro Data:  10/24 SARS CoV2> neg   Antimicrobials:  10/24 vanc>  10/24 cefepime>   Interim history/subjective:  Intermittently follows commands. Immediately failed SBT this morning again with PSV 8/5  Objective   Blood pressure (!) 94/49, pulse 100, temperature 99 F (37.2 C), temperature source Bladder, resp. rate 18, height 5\' 4"  (1.626 m), weight 68.2 kg, SpO2 98 %.    Vent Mode: PRVC FiO2 (%):  [30 %-40 %] 30 % Set Rate:  [18 bmp] 18 bmp Vt Set:  [440 mL] 440 mL PEEP:  [7 cmH20] 7 cmH20 Plateau Pressure:  [21 cmH20-25 cmH20] 24 cmH20   Intake/Output Summary (Last 24 hours) at 08/31/2019 1010 Last data filed at 08/31/2019 0800 Gross per 24 hour  Intake 2026.41 ml  Output 2101 ml  Net -74.59 ml   Filed Weights   08/29/19  0500 08/30/19 0429 08/31/19 0500  Weight: 69.1 kg 68.3 kg 68.2 kg    Examination: General: Elderly, frail, intubated HENT: Moist oral mucosa Lungs: Chest clear. No dyssynchrony. Cardiovascular: JVP is not clearly visible, apex beat is displaced and sustained heart sounds are distant. Abdomen: Bowel sounds appreciated Extremities: mild lower extremity edema Neuro:  Moving extremities  While in distress 10/28 I performed a CCM POC echo which showed no significant change in LV function or degree of MR.  Marked interstitial pattern on L side consistent with pulmonary edema. Large R sided effusion.  Resolved Hospital Problem list    Assessment & Plan:   Acute encephalopathy - compatible with delirium. CVA on MRI appear subacute -Neurology did see patient - no intervention required at this time apart for secondary stroke prevention.  Critically ill due to acute hypoxemic respiratory failure Secondary to heart failure as cultures and procalcitonin negative Failed SBT again and will likely restoration of SR before she is able to tolerate extubation. -Stopped antibiotics - Intensifying diuresis with IV furosemide infusion.   Heart failure with reduced ejection fraction Repeat echocardiogram did show improvement in ejection fraction, but was performed with patient on full ventilatory support. Significant wall motion abnormalities -Reintroduced home heart failure therapy -Added topical nitrates. -Dr. Gala Romney following  Right pleural effusion -thoracentesis today as effusion is substantial and likely contributing to inability to separate from mechanical ventilation - Consider CT chest.  Critically ill due atrial fibrillation with rapid ventricular response. Loss of atrial kick contributing to pulmonary edema in context of pre-existing diastolic dysfunction.  -Continue amiodarone and metoprolol -Plan for TEE guided cardioversion tomorrow.  Asymptomatic bactiuria. E coli present but urinalysis negative. -Stop antibiotics.  Best practice:  Diet: Continue tube feeds Pain/Anxiety/Delirium protocol (if indicated): Fentanyl infusion and prn fentanyl VAP protocol (if indicated): In place DVT prophylaxis: Heparin IV  GI prophylaxis: Protonix Glucose control: Insulin SSC Mobility: Bedrest Code Status: Partial code Family Communication: Discussed with  daughter at bedside 10/26 2020 Disposition: ICU  Labs   CBC: Recent Labs  Lab 08/26/19 0011 08/26/19 0441 08/26/19 1305 08/28/19 0830 08/30/19 0320 08/31/19 0251  WBC 19.8* 18.4*  --  12.1* 10.1 12.5*  NEUTROABS 16.1*  --   --  8.0*  --   --   HGB 11.7* 9.9* 10.5* 9.1* 9.2* 8.6*  HCT 39.3 32.2* 31.0* 31.0* 32.1* 28.1*  MCV 75.3* 72.7*  --  75.6* 76.4* 72.2*  PLT 464* 292  --  320 303 295    Basic Metabolic Panel: Recent Labs  Lab 08/26/19 0154 08/26/19 0441  08/26/19 1837 08/28/19 0830 08/29/19 1153 08/30/19 0320 08/31/19 0251  NA 125*  --    < > 128* 131* 134* 134* 135  K 5.1  --    < > 4.4 4.8 4.3 3.8 4.1  CL 96*  --   --  99 101 104 100 99  CO2 16*  --   --  19* 23 24 27 23   GLUCOSE 144*  --   --  117* 119* 161* 144* 149*  BUN 15  --   --  14 26* 31* 43* 62*  CREATININE 0.62  --   --  0.69 0.61 0.52 0.75 0.99  CALCIUM 9.0  --   --  9.4 9.7 9.9 9.9 10.0  MG 1.8 1.8  --   --   --   --   --   --   PHOS 4.8* 4.0  --   --   --   --   --   --    < > =  values in this interval not displayed.   GFR: Estimated Creatinine Clearance: 38.7 mL/min (by C-G formula based on SCr of 0.99 mg/dL). Recent Labs  Lab 08/26/19 0011 08/26/19 0154 08/26/19 0441 08/28/19 0830 08/30/19 0320 08/31/19 0251  PROCALCITON  --  <0.10  --   --   --   --   WBC 19.8*  --  18.4* 12.1* 10.1 12.5*  LATICACIDVEN 2.5*  --  2.3*  --   --   --     Liver Function Tests: Recent Labs  Lab 08/26/19 0441  AST 28  ALT 21  ALKPHOS 110  BILITOT 1.5*  PROT 4.8*  ALBUMIN 2.5*   No results for input(s): LIPASE, AMYLASE in the last 168 hours. Recent Labs  Lab 08/26/19 0441  AMMONIA 15    ABG    Component Value Date/Time   PHART 7.443 08/26/2019 1305   PCO2ART 29.5 (L) 08/26/2019 1305   PO2ART 213.0 (H) 08/26/2019 1305   HCO3 20.2 08/26/2019 1305   TCO2 21 (L) 08/26/2019 1305   ACIDBASEDEF 3.0 (H) 08/26/2019 1305   O2SAT 100.0 08/26/2019 1305      Addendum:  Thoracentesis  consistent with chronic exudative process consistent with post-pericardiotomy injury with negative Gram stain and low fluid TG.  Will start steroids and colchicine with slow taper.   CRITICAL CARE Performed by: Kipp Brood   Total critical care time: 40 minutes  Critical care time was exclusive of separately billable procedures and treating other patients.  Critical care was necessary to treat or prevent imminent or life-threatening deterioration.  Critical care was time spent personally by me on the following activities: development of treatment plan with patient and/or surrogate as well as nursing, discussions with consultants, evaluation of patient's response to treatment, examination of patient, obtaining history from patient or surrogate, ordering and performing treatments and interventions, ordering and review of laboratory studies, ordering and review of radiographic studies, pulse oximetry, re-evaluation of patient's condition and participation in multidisciplinary rounds.  Kipp Brood, MD [facility] ICU Physician [place]  Pager: [phone] Mobile: [phone] After hours: [phone].

## 2019-08-31 NOTE — Progress Notes (Signed)
[place] Progress Note Patient Name: Lacey Coleman DOB: 11-09-1930 MRN: [id number]   Date of Service  08/31/2019  HPI/Events of Note  Notified of hypoxia.   Pt is intubated and on fentanyl.  It had to be decreased earlier due to hypotension.   Lungs were apparently clear per RN.  She has noted change to have increased rhonchi.   On video assessment, O2 sats  94%.  RT increased FiO2 to 40% from 30%. Pt is awake and comfortable (per the RN is looking better with increased fentanyl rate).  eICU Interventions  Pt given breathing treatment. Suspect it may have been related to vent asynchrony.   Give versed 2mg  IV now and prn. Repeat CXR although pt is already on a Lasix gtt.      Intervention Category Intermediate Interventions: Respiratory distress - evaluation and management  Elsie Lincoln 08/31/2019, 12:05 AM

## 2019-08-31 NOTE — Progress Notes (Addendum)
Advanced Heart Failure Rounding Note   Subjective:    On vent at 30%. Remains in AFL with RVR.   Awake but agitated. Modest diuresis with lasix gtt, metolazone and spiro.   Weight unchanged. CXR essentially unchanged. BUN/CR up some    Objective:   Weight Range:  Vital Signs:   Temp:  [99 F (37.2 C)-99.7 F (37.6 C)] 99 F (37.2 C) (10/28 1745) Pulse Rate:  [58-139] 115 (10/29 0545) Resp:  [14-36] 20 (10/29 0545) BP: (64-188)/(44-99) 112/71 (10/29 0545) SpO2:  [84 %-100 %] 99 % (10/29 0545) FiO2 (%):  [30 %-40 %] 30 % (10/29 0345) Weight:  [68.2 kg] 68.2 kg (10/29 0500) Last BM Date: (PTA)  Weight change: Filed Weights   08/29/19 0500 08/30/19 0429 08/31/19 0500  Weight: 69.1 kg 68.3 kg 68.2 kg    Intake/Output:   Intake/Output Summary (Last 24 hours) at 08/31/2019 0753 Last data filed at 08/31/2019 0600 Gross per 24 hour  Intake 2276.35 ml  Output 2121 ml  Net 155.35 ml     Physical Exam: General:  Awake on vent agitated at times . HEENT: normal + ETT Neck: supple. no obvious JVD. Carotids 2+ bilat; no bruits. No lymphadenopathy or thryomegaly appreciated. Cor: PMI nondisplaced. Irregular tachy Lungs: + crackles anteriorly Abdomen: soft, nontender, nondistended. No hepatosplenomegaly. No bruits or masses. Good bowel sounds. Extremities: no cyanosis, clubbing, rash, edema Neuro: awake on vent  Telemetry: AFL 100-110 Personally reviewed   Labs: Basic Metabolic Panel: Recent Labs  Lab 08/26/19 0154 08/26/19 0441  08/26/19 1837 08/28/19 0830 08/29/19 1153 08/30/19 0320 08/31/19 0251  NA 125*  --    < > 128* 131* 134* 134* 135  K 5.1  --    < > 4.4 4.8 4.3 3.8 4.1  CL 96*  --   --  99 101 104 100 99  CO2 16*  --   --  19* '23 24 27 23  '$ GLUCOSE 144*  --   --  117* 119* 161* 144* 149*  BUN 15  --   --  14 26* 31* 43* 62*  CREATININE 0.62  --   --  0.69 0.61 0.52 0.75 0.99  CALCIUM 9.0  --   --  9.4 9.7 9.9 9.9 10.0  MG 1.8 1.8  --   --   --    --   --   --   PHOS 4.8* 4.0  --   --   --   --   --   --    < > = values in this interval not displayed.    Liver Function Tests: Recent Labs  Lab 08/26/19 0441  AST 28  ALT 21  ALKPHOS 110  BILITOT 1.5*  PROT 4.8*  ALBUMIN 2.5*   No results for input(s): LIPASE, AMYLASE in the last 168 hours. Recent Labs  Lab 08/26/19 0441  AMMONIA 15    CBC: Recent Labs  Lab 08/26/19 0011 08/26/19 0441 08/26/19 1305 08/28/19 0830 08/30/19 0320 08/31/19 0251  WBC 19.8* 18.4*  --  12.1* 10.1 12.5*  NEUTROABS 16.1*  --   --  8.0*  --   --   HGB 11.7* 9.9* 10.5* 9.1* 9.2* 8.6*  HCT 39.3 32.2* 31.0* 31.0* 32.1* 28.1*  MCV 75.3* 72.7*  --  75.6* 76.4* 72.2*  PLT 464* 292  --  320 303 295    Cardiac Enzymes: No results for input(s): CKTOTAL, CKMB, CKMBINDEX, TROPONINI in the last 168 hours.  BNP:  BNP (last 3 results) Recent Labs    07/16/19 0141 08/26/19 0441  BNP 2,027.0* 497.7*    ProBNP (last 3 results) No results for input(s): PROBNP in the last 8760 hours.    Other results:  Imaging: Dg Chest Port 1 View  Result Date: 08/31/2019 CLINICAL DATA:  Respiratory failure EXAM: PORTABLE CHEST 1 VIEW COMPARISON:  August 30, 2019 FINDINGS: The mediastinal contour and cardiac silhouette are stable. Endotracheal tube is identified with distal tip 1.5 cm from carina. Nasogastric tube is identified unchanged. Consolidation of bilateral mid and lung bases are identified unchanged. Interstitial edema is identified unchanged. Moderate bilateral pleural effusions are unchanged. No pneumothorax. IMPRESSION: Persistent interstitial edema with consolidation of bilateral lungs and bilateral pleural effusions unchanged compared prior exam. Electronically Signed   By: Abelardo Diesel M.D.   On: 08/31/2019 07:20   Dg Chest Port 1 View  Result Date: 08/30/2019 CLINICAL DATA:  CHF and pleural effusions. EXAM: PORTABLE CHEST 1 VIEW COMPARISON:  08/28/2019 FINDINGS: Endotracheal tube remains  with the tip approximately 1 cm above the carina. Gastric decompression tube extends below the diaphragm. Some improved aeration noted at both lung bases with moderate bilateral pleural effusions remaining. Mild interstitial edema remains. The heart size is stable. No pneumothorax. IMPRESSION: 1. Some improved aeration at both lung bases with moderate bilateral pleural effusions remaining. Mild interstitial edema remains. 2. Endotracheal tube tip remains approximately 1 cm above the carina. Electronically Signed   By: Aletta Edouard M.D.   On: 08/30/2019 08:57     Medications:     Scheduled Medications:  aspirin  81 mg Per Tube Daily   atorvastatin  40 mg Oral q1800   chlorhexidine gluconate (MEDLINE KIT)  15 mL Mouth Rinse BID   Chlorhexidine Gluconate Cloth  6 each Topical Daily   etomidate  20 mg Intravenous Once   famotidine  20 mg Per Tube Daily   insulin aspart  0-9 Units Subcutaneous Q4H   mouth rinse  15 mL Mouth Rinse 10 times per day   metoprolol tartrate  25 mg Per Tube BID   midazolam       nitroGLYCERIN  0.4 mg Transdermal Daily   spironolactone  12.5 mg Per Tube Daily   succinylcholine  100 mg Intravenous Once    Infusions:  amiodarone 30 mg/hr (08/31/19 0600)   diltiazem (CARDIZEM) infusion     feeding supplement (VITAL AF 1.2 CAL) 1,000 mL (08/30/19 1649)   fentaNYL infusion INTRAVENOUS 75 mcg/hr (08/30/19 1630)   furosemide (LASIX) infusion 15 mg/hr (08/31/19 0600)   heparin 1,050 Units/hr (08/31/19 0600)    PRN Medications: acetaminophen, fentaNYL (SUBLIMAZE) injection, ipratropium-albuterol, midazolam, ondansetron (ZOFRAN) IV   Assessment/Plan:    1. Acute on chronic diastolic HF - Echo EF 79-89% - CXR still wet with bilateral effusions. Modest diuretic response to lasix gtt but weight and CXR unchanged - I am not optimistic that we can improve her respiratory status much more with diuresis. Would consider non-contrast chest CT to  further assess effusions and possible tap - will decrease lasix gtt to 10 for now given soft BP and rising BUN/Cr  2. Acute Hypoxic Respiratory Failure - due to CHF - see plan as above  3. Paroxysmal AFL with RVR - new. Unclear duration. Suspect ~ 1 week - continue amio and heparin. Had planned TEE/DC-CV today but TFs just turned off and I would like to see CXR improved prior to attempt at [facility]. Can do later today or tomorrow. Will discuss  timing with CCM  4. Pleural effusions - follow with diuresis.  - see discussion above  5. CAD s/p CABG - no s/s ischemia. Continue ASA/statin  6. AMS - likely due to hypoxia - doubt CVA major issue here - resolved  7. Subacute CVA - Neuro has seen  8. Aortic insufficiency - Mild to moderate on echo. - pressure 1/2t 385mso probably at least moderate . Will reassess with TEE   CRITICAL CARE Performed by: BGlori Bickers Total critical care time: 35 minutes  Critical care time was exclusive of separately billable procedures and treating other patients.  Critical care was necessary to treat or prevent imminent or life-threatening deterioration.  Critical care was time spent personally by me (independent of midlevel providers or residents) on the following activities: development of treatment plan with patient and/or surrogate as well as nursing, discussions with consultants, evaluation of patient's response to treatment, examination of patient, obtaining history from patient or surrogate, ordering and performing treatments and interventions, ordering and review of laboratory studies, ordering and review of radiographic studies, pulse oximetry and re-evaluation of patient's condition.    Length of Stay: 5   DGlori BickersMD 08/31/2019, 7:53 AM  Advanced Heart Failure Team Pager [phone](M-F; 7[place]  Please contact [facility] for night-coverage after hours (4p -7a ) and weekends on [email or web address]

## 2019-09-01 ENCOUNTER — Other Ambulatory Visit (HOSPITAL_COMMUNITY): Payer: Medicare Other

## 2019-09-01 ENCOUNTER — Ambulatory Visit: Payer: Self-pay | Admitting: Cardiothoracic Surgery

## 2019-09-01 ENCOUNTER — Inpatient Hospital Stay (HOSPITAL_COMMUNITY): Payer: Medicare Other

## 2019-09-01 DIAGNOSIS — J9601 Acute respiratory failure with hypoxia: Secondary | ICD-10-CM

## 2019-09-01 DIAGNOSIS — J90 Pleural effusion, not elsewhere classified: Secondary | ICD-10-CM

## 2019-09-01 LAB — SEDIMENTATION RATE: Sed Rate: 56 mm/hr — ABNORMAL HIGH (ref 0–22)

## 2019-09-01 LAB — HEPARIN LEVEL (UNFRACTIONATED): Heparin Unfractionated: 0.33 IU/mL (ref 0.30–0.70)

## 2019-09-01 LAB — GLUCOSE, CAPILLARY
Glucose-Capillary: 114 mg/dL — ABNORMAL HIGH (ref 70–99)
Glucose-Capillary: 116 mg/dL — ABNORMAL HIGH (ref 70–99)
Glucose-Capillary: 121 mg/dL — ABNORMAL HIGH (ref 70–99)
Glucose-Capillary: 136 mg/dL — ABNORMAL HIGH (ref 70–99)
Glucose-Capillary: 137 mg/dL — ABNORMAL HIGH (ref 70–99)
Glucose-Capillary: 138 mg/dL — ABNORMAL HIGH (ref 70–99)

## 2019-09-01 LAB — CBC
HCT: 26.8 % — ABNORMAL LOW (ref 36.0–46.0)
Hemoglobin: 8.3 g/dL — ABNORMAL LOW (ref 12.0–15.0)
MCH: 21.8 pg — ABNORMAL LOW (ref 26.0–34.0)
MCHC: 31 g/dL (ref 30.0–36.0)
MCV: 70.3 fL — ABNORMAL LOW (ref 80.0–100.0)
Platelets: 300 10*3/uL (ref 150–400)
RBC: 3.81 MIL/uL — ABNORMAL LOW (ref 3.87–5.11)
RDW: 17.5 % — ABNORMAL HIGH (ref 11.5–15.5)
WBC: 11.5 10*3/uL — ABNORMAL HIGH (ref 4.0–10.5)
nRBC: 0 % (ref 0.0–0.2)

## 2019-09-01 LAB — BASIC METABOLIC PANEL
Anion gap: 16 — ABNORMAL HIGH (ref 5–15)
BUN: 76 mg/dL — ABNORMAL HIGH (ref 8–23)
CO2: 25 mmol/L (ref 22–32)
Calcium: 9.7 mg/dL (ref 8.9–10.3)
Chloride: 91 mmol/L — ABNORMAL LOW (ref 98–111)
Creatinine, Ser: 1.14 mg/dL — ABNORMAL HIGH (ref 0.44–1.00)
GFR calc Af Amer: 50 mL/min — ABNORMAL LOW (ref >60)
GFR calc non Af Amer: 44 mL/min — ABNORMAL LOW (ref >60)
Glucose, Bld: 98 mg/dL (ref 70–99)
Potassium: 3.8 mmol/L (ref 3.5–5.1)
Sodium: 132 mmol/L — ABNORMAL LOW (ref 135–145)

## 2019-09-01 LAB — PATHOLOGIST SMEAR REVIEW

## 2019-09-01 MED ORDER — LIDOCAINE HCL (PF) 1 % IJ SOLN
INTRAMUSCULAR | Status: AC
  Administered 2019-09-01: 5 mL
  Filled 2019-09-01: qty 5

## 2019-09-01 MED ORDER — LIDOCAINE 1% INJECTION FOR CIRCUMCISION
5.0000 mL | INJECTION | Freq: Once | INTRAVENOUS | Status: DC
  Filled 2019-09-01: qty 5

## 2019-09-01 MED ORDER — LACTATED RINGERS IV BOLUS
1000.0000 mL | Freq: Once | INTRAVENOUS | Status: AC
  Administered 2019-09-01: 1000 mL via INTRAVENOUS

## 2019-09-01 MED ORDER — LIDOCAINE HCL 2 % IJ SOLN
20.0000 mL | Freq: Once | INTRAMUSCULAR | Status: AC
  Administered 2019-09-01: 400 mg via INTRADERMAL
  Filled 2019-09-01: qty 20

## 2019-09-01 MED ORDER — EPINEPHRINE 1 MG/10ML IJ SOSY
PREFILLED_SYRINGE | INTRAMUSCULAR | Status: AC
  Filled 2019-09-01: qty 10

## 2019-09-01 MED ORDER — PHENYLEPHRINE HCL-NACL 10-0.9 MG/250ML-% IV SOLN
0.0000 ug/min | INTRAVENOUS | Status: DC
  Administered 2019-09-01: 14:00:00 20 ug/min via INTRAVENOUS
  Administered 2019-09-02: 30 ug/min via INTRAVENOUS
  Filled 2019-09-01 (×4): qty 250

## 2019-09-01 MED ORDER — NALOXONE HCL 0.4 MG/ML IJ SOLN
0.1000 mg | Freq: Once | INTRAMUSCULAR | Status: AC
  Administered 2019-09-01: 12:00:00 0.1 mg via INTRAVENOUS

## 2019-09-01 MED ORDER — NALOXONE HCL 0.4 MG/ML IJ SOLN
INTRAMUSCULAR | Status: AC
  Administered 2019-09-01: 0.1 mg via INTRAVENOUS
  Filled 2019-09-01: qty 1

## 2019-09-01 MED ORDER — POTASSIUM CHLORIDE 20 MEQ PO PACK
40.0000 meq | PACK | Freq: Once | ORAL | Status: AC
  Administered 2019-09-01: 40 meq
  Filled 2019-09-01: qty 2

## 2019-09-01 MED ORDER — QUETIAPINE FUMARATE 25 MG PO TABS
25.0000 mg | ORAL_TABLET | Freq: Two times a day (BID) | ORAL | Status: DC
  Administered 2019-09-01 – 2019-09-02 (×3): 25 mg via ORAL
  Filled 2019-09-01 (×3): qty 1

## 2019-09-01 MED ORDER — LIDOCAINE HCL (CARDIAC) PF 100 MG/5ML IV SOSY
PREFILLED_SYRINGE | INTRAVENOUS | Status: AC
  Administered 2019-09-01: 100 mg
  Filled 2019-09-01: qty 5

## 2019-09-01 NOTE — Procedures (Signed)
Chest Tube Insertion Procedure Note  Indications:  Clinically significant Effusion, bilateral  Pre-operative Diagnosis: Effusion, bilateral  Post-operative Diagnosis: Effusion, bilateral  Procedure Details  Informed consent was obtained for the procedure, including sedation.  Risks of lung perforation, hemorrhage, arrhythmia, and adverse drug reaction were discussed.   After sterile skin prep, using standard technique, a 28  French tube was placed in the right anterior 6th rib space. A 20 Fr chest tube was inserted in the left chest under similar procedure.  Findings: 1500 ml of serosanguinous fluid obtained on each side  Estimated Blood Loss:  Minimal         Specimens:  None              Complications:  None; patient tolerated the procedure well.         Disposition: ICU - intubated and hemodynamically stable.         Condition: stable  Attending Attestation: I performed the procedure.  Nicola Heinemann Z. Orvan Seen, [facility]

## 2019-09-01 NOTE — Progress Notes (Signed)
NAME:  Lacey Coleman, MRN:  [id number], DOB:  [birth year], LOS: 6 ADMISSION DATE:  08/08/2019, CONSULTATION DATE: 08/26/2019 REFERRING MD: Dr. Leonides Schanz, CHIEF COMPLAINT: Altered mental status  Brief History   85 year old lady found unresponsive at home, upon arrival by EMS, intubated.  History of present illness   History obtained from chart  85 yo F PMH CAD s/p CABG in 08/2019, CHF, HTN, RA, A fib (eliquis dc 1 week ago) who presents 10/23 with AMS. Patient was found unresponsive at home in chair, found by family. Upon EMS arrival, SpO2 30%.  EMS reported ability to follow commands en route after being placed on NRB; EMS reports degree of aphagia. In ED, patient not following commands, and is subsequently intubated due to hypoxia. Concern for CVA due to AMS; CT  H obtained and is concerning for acute vs subacute infarct. Neurology informally consulted by EDP and will see if MRI brain reveals acute infarct. Patient determined not to be candidate for tPA.   Per patient's daughter, patient experienced a fall during hospitalization approx 1 month ago and has been weak since discharge home.   Lactic acid 2.5, WBC 19.8, ABG 7.28/51/133. BMP pendng  COVID-19 neg Started of vanc and cefepime in ED   Past Medical History   Past Medical History:  Diagnosis Date  . CAD (coronary artery disease)    9/14 LHC with EF 25-35%, mod elevated LVEDP, dLM 90%, pLCx80%, pLAD to mLAD 90%, 1st diag 80%, mid Cx 100%, pRCA 90%, pRCA to mRCA 100%  . Heart failure with reduced ejection fraction ([place])    a) 9/13 echo with EF 30-35%, mild LVF, diffuse hypokinesis, mod aortic calcification, mild AR  . Hypertension   . Rheumatoid arthritis ([place])      Significant Hospital Events   Altered mental status Intubated for airway protection CT reveals acute versus subacute infarct MRI did reveal a stroke-subacute  Consults:  Neurology Cardiology.  Procedures:  10/24 endotracheal tube>>  Significant Diagnostic  Tests:  10/24 CXR> bilateral pleural effusions, bibasilar atelectasis vs infiltrate   10/24 CT H non con> 1. No acute intracranial hemorrhage. 2. Mild age-related atrophy and chronic microvascular ischemic changes. 3. Bilateral frontal white matter hypodense lesions, new since theprior CT of 08/07/2019 and concerning for areas of subacute or acute infarct. Clinical correlation is recommended. MRI may provide betterevaluation if clinically indicated.  10/24 MRI brain IMPRESSION: 1. 18 mm focus of diffusion abnormality involving the subcortical left frontal lobe, most consistent with evolving late subacute ischemic infarct. No associated hemorrhage or mass effect. 2. No other acute intracranial abnormality. 3. Underlying atrophy with mild chronic microvascular ischemic disease.  Micro Data:  10/24 SARS CoV2> neg   Antimicrobials:  10/24 vanc>  10/24 cefepime>   Interim history/subjective:  CT chest yesterday shows large bilateral effusions. Bilateral chest tubes placed by Dr Orvan Seen.  Objective   Blood pressure 108/61, pulse 89, temperature 97.8 F (36.6 C), temperature source Axillary, resp. rate 18, height 5\' 4"  (1.626 m), weight 69 kg, SpO2 100 %.    Vent Mode: PRVC FiO2 (%):  [30 %] 30 % Set Rate:  [18 bmp] 18 bmp Vt Set:  [440 mL] 440 mL PEEP:  [5 cmH20-7 cmH20] 5 cmH20 Plateau Pressure:  [20 cmH20-25 cmH20] 25 cmH20   Intake/Output Summary (Last 24 hours) at 09/01/2019 1418 Last data filed at 09/01/2019 1346 Gross per 24 hour  Intake 1204.82 ml  Output 2930 ml  Net -1725.18 ml   [employer]  08/30/19 0429 08/31/19 0500 09/01/19 0500  Weight: 68.3 kg 68.2 kg 69 kg    Examination: General: Elderly, frail, intubated HENT: Moist oral mucosa Lungs: Chest clear. No dyssynchrony. Cardiovascular: JVP is not clearly visible, apex beat is displaced and sustained heart sounds are distant. Abdomen: Bowel sounds appreciated Extremities: mild lower extremity edema  Neuro: Moving extremities   Resolved Hospital Problem list    Assessment & Plan:   Acute encephalopathy - compatible with delirium. CVA on MRI appear subacute -Neurology did see patient - no intervention required at this time apart for secondary stroke prevention. -Continue seroquel for periodic agitation.  Critically ill due to acute hypoxemic respiratory failure Secondary to heart failure as cultures and procalcitonin negative Failed SBT again and will likely restoration of SR before she is able to tolerate extubation. -Stopped antibiotics - Pleural effusions drained.  - Amiodarone stopped as possible lung toxicity.  Heart failure with preserved. ejection fraction -Reintroduced home heart failure therapy -Dr. Gala Romney following  Bilateral large effusions- Sampling on Right shows inflammatory effusions - Bilateral chest tubes placed by TCTS. - Continue steroids and colchicine.  Critically ill due atrial fibrillation with rapid ventricular response. Loss of atrial kick contributing to pulmonary edema in context of pre-existing diastolic dysfunction.  -Amiodarone stopped -Continue beta-blocker.  Asymptomatic bactiuria. E coli present but urinalysis negative. -Stopped antibiotics.  Best practice:  Diet: Continue tube feeds Pain/Anxiety/Delirium protocol (if indicated): Fentanyl infusion and prn fentanyl VAP protocol (if indicated): In place DVT prophylaxis: Heparin IV  GI prophylaxis: Protonix Glucose control: Insulin SSC Mobility: Bedrest Code Status: Partial code Family Communication: Discussed with daughter at bedside 10/29 Disposition: ICU  Labs   CBC: Recent Labs  Lab 08/26/19 0011 08/26/19 0441 08/26/19 1305 08/28/19 0830 08/30/19 0320 08/31/19 0251 09/01/19 0326  WBC 19.8* 18.4*  --  12.1* 10.1 12.5* 11.5*  NEUTROABS 16.1*  --   --  8.0*  --   --   --   HGB 11.7* 9.9* 10.5* 9.1* 9.2* 8.6* 8.3*  HCT 39.3 32.2* 31.0* 31.0* 32.1* 28.1* 26.8*  MCV  75.3* 72.7*  --  75.6* 76.4* 72.2* 70.3*  PLT 464* 292  --  320 303 295 300    Basic Metabolic Panel: Recent Labs  Lab 08/26/19 0154 08/26/19 0441  08/28/19 0830 08/29/19 1153 08/30/19 0320 08/31/19 0251 09/01/19 0326  NA 125*  --    < > 131* 134* 134* 135 132*  K 5.1  --    < > 4.8 4.3 3.8 4.1 3.8  CL 96*  --    < > 101 104 100 99 91*  CO2 16*  --    < > 23 24 27 23 25   GLUCOSE 144*  --    < > 119* 161* 144* 149* 98  BUN 15  --    < > 26* 31* 43* 62* 76*  CREATININE 0.62  --    < > 0.61 0.52 0.75 0.99 1.14*  CALCIUM 9.0  --    < > 9.7 9.9 9.9 10.0 9.7  MG 1.8 1.8  --   --   --   --   --   --   PHOS 4.8* 4.0  --   --   --   --   --   --    < > = values in this interval not displayed.   GFR: Estimated Creatinine Clearance: 33.8 mL/min (A) (by C-G formula based on SCr of 1.14 mg/dL (H)). Recent Labs  Lab 08/26/19  0011 08/26/19 0154 08/26/19 0441 08/28/19 0830 08/30/19 0320 08/31/19 0251 09/01/19 0326  PROCALCITON  --  <0.10  --   --   --   --   --   WBC 19.8*  --  18.4* 12.1* 10.1 12.5* 11.5*  LATICACIDVEN 2.5*  --  2.3*  --   --   --   --     Liver Function Tests: Recent Labs  Lab 08/26/19 0441  AST 28  ALT 21  ALKPHOS 110  BILITOT 1.5*  PROT 4.8*  ALBUMIN 2.5*   No results for input(s): LIPASE, AMYLASE in the last 168 hours. Recent Labs  Lab 08/26/19 0441  AMMONIA 15    ABG    Component Value Date/Time   PHART 7.443 08/26/2019 1305   PCO2ART 29.5 (L) 08/26/2019 1305   PO2ART 213.0 (H) 08/26/2019 1305   HCO3 20.2 08/26/2019 1305   TCO2 21 (L) 08/26/2019 1305   ACIDBASEDEF 3.0 (H) 08/26/2019 1305   O2SAT 100.0 08/26/2019 1305      CRITICAL CARE Performed by: Lynnell Catalan   Total critical care time: 40 minutes  Critical care time was exclusive of separately billable procedures and treating other patients.  Critical care was necessary to treat or prevent imminent or life-threatening deterioration.  Critical care was time spent personally  by me on the following activities: development of treatment plan with patient and/or surrogate as well as nursing, discussions with consultants, evaluation of patient's response to treatment, examination of patient, obtaining history from patient or surrogate, ordering and performing treatments and interventions, ordering and review of laboratory studies, ordering and review of radiographic studies, pulse oximetry, re-evaluation of patient's condition and participation in multidisciplinary rounds.  Lynnell Catalan, MD [facility] ICU Physician [facility] [place] [facility]  Pager: [phone] Mobile: [phone] After hours: [phone].

## 2019-09-01 NOTE — Progress Notes (Signed)
  Subjective: Intubated/sedated; nods head to voice  Objective: Vital signs in last 24 hours: Temp:  [99.2 F (37.3 C)] 99.2 F (37.3 C) (10/30 0845) Pulse Rate:  [75-129] 97 (10/30 1000) Cardiac Rhythm: Atrial fibrillation;Atrial flutter (10/30 0800) Resp:  [16-34] 18 (10/30 1000) BP: (82-163)/(46-81) 95/52 (10/30 1000) SpO2:  [97 %-100 %] 100 % (10/30 1000) FiO2 (%):  [30 %] 30 % (10/30 0753) Weight:  [69 kg] 69 kg (10/30 0500)  Hemodynamic parameters for last 24 hours:    Intake/Output from previous day: 10/29 0701 - 10/30 0700 In: 1539.1 [I.V.:934.1; NG/GT:605] Out: 2930 [Urine:2930] Intake/Output this shift: Total I/O In: 170.3 [I.V.:70.3; NG/GT:100] Out: 370 [Urine:370]  General appearance: intubated, frail Neurologic: intact Heart: regular rate and rhythm Lungs: diminished breath sounds bilaterally Abdomen: soft, non-tender; bowel sounds normal; no masses,  no organomegaly Extremities: edema 2+ Wound: well-healed  Lab Results: Recent Labs    08/31/19 0251 09/01/19 0326  WBC 12.5* 11.5*  HGB 8.6* 8.3*  HCT 28.1* 26.8*  PLT 295 300   BMET:  Recent Labs    08/31/19 0251 09/01/19 0326  NA 135 132*  K 4.1 3.8  CL 99 91*  CO2 23 25  GLUCOSE 149* 98  BUN 62* 76*  CREATININE 0.99 1.14*  CALCIUM 10.0 9.7    PT/INR: No results for input(s): LABPROT, INR in the last 72 hours. ABG    Component Value Date/Time   PHART 7.443 08/26/2019 1305   HCO3 20.2 08/26/2019 1305   TCO2 21 (L) 08/26/2019 1305   ACIDBASEDEF 3.0 (H) 08/26/2019 1305   O2SAT 100.0 08/26/2019 1305   CBG (last 3)  Recent Labs    08/31/19 2344 09/01/19 0450 09/01/19 0852  GLUCAP 137* 114* 116*    Assessment/Plan: S/P  Pt is well-known to me; readmitted for CHF and intubated for increased WOB. Large bilateral effusions, much worse over past few weeks. Agree with need for bilateral pleural tubes; given her frail state, recent anticoagulation, etc, I would prefer to do the tubes  at the bedside personally. Spoke with daughter, Langley Gauss, who agrees and provided phone consent for bilateral chest tubes with risk of pain, bleeding, and injury to intrathoracic structures.      LOS: 6 days    Wonda Olds 09/01/2019

## 2019-09-01 NOTE — Progress Notes (Addendum)
Advanced Heart Failure Rounding Note   Subjective:    Remains on vent. Poor diuresis with IV lasix.   CT chest with peripheral patchy infiltrates and large bilateral pleural effusions with compressive atx.   Had diagnostic tap with 1L off R effusion yesterday. Likely transudative/inflammatory  Objective:   Weight Range:  Vital Signs:   Temp:  [99.2 F (37.3 C)] 99.2 F (37.3 C) (10/29 1200) Pulse Rate:  [75-128] 115 (10/30 0829) Resp:  [16-34] 18 (10/30 0829) BP: (82-163)/(46-81) 93/70 (10/30 0829) SpO2:  [97 %-100 %] 100 % (10/30 0829) FiO2 (%):  [30 %] 30 % (10/30 0753) Weight:  [69 kg] 69 kg (10/30 0500) Last BM Date: (PTA)  Weight change: Filed Weights   08/30/19 0429 08/31/19 0500 09/01/19 0500  Weight: 68.3 kg 68.2 kg 69 kg    Intake/Output:   Intake/Output Summary (Last 24 hours) at 09/01/2019 0901 Last data filed at 09/01/2019 0840 Gross per 24 hour  Intake 1465.53 ml  Output 3070 ml  Net -1604.47 ml     Physical Exam: General:  Frail elderly awake on vent HEENT: normal +ETT Neck: supple. JVP 5. Carotids 2+ bilat; no bruits. No lymphadenopathy or thryomegaly appreciated. Cor: PMI nondisplaced. Regular rate & rhythm. No rubs, gallops or murmurs. Lungs: decreased  Abdomen: soft, nontender, nondistended. No hepatosplenomegaly. No bruits or masses. Good bowel sounds. Extremities: no cyanosis, clubbing, rash, edema Neuro: awake on vent. Follows commands    Telemetry: AFL 100-115 Personally reviewed   Labs: Basic Metabolic Panel: Recent Labs  Lab 08/26/19 0154 08/26/19 0441  08/28/19 0830 08/29/19 1153 08/30/19 0320 08/31/19 0251 09/01/19 0326  NA 125*  --    < > 131* 134* 134* 135 132*  K 5.1  --    < > 4.8 4.3 3.8 4.1 3.8  CL 96*  --    < > 101 104 100 99 91*  CO2 16*  --    < > _0 GLUCOSE 144*  --    < > 119* 161* 144* 149* 98  BUN 15  --    < > 26* 31* 43* 62* 76*  CREATININE 0.62  --    < > 0.61 0.52 0.75 0.99 1.14*    CALCIUM 9.0  --    < > 9.7 9.9 9.9 10.0 9.7  MG 1.8 1.8  --   --   --   --   --   --   PHOS 4.8* 4.0  --   --   --   --   --   --    < > = values in this interval not displayed.    Liver Function Tests: Recent Labs  Lab 08/26/19 0441  AST 28  ALT 21  ALKPHOS 110  BILITOT 1.5*  PROT 4.8*  ALBUMIN 2.5*   No results for input(s): LIPASE, AMYLASE in the last 168 hours. Recent Labs  Lab 08/26/19 0441  AMMONIA 15    CBC: Recent Labs  Lab 08/26/19 0011 08/26/19 0441 08/26/19 1305 08/28/19 0830 08/30/19 0320 08/31/19 0251 09/01/19 0326  WBC 19.8* 18.4*  --  12.1* 10.1 12.5* 11.5*  NEUTROABS 16.1*  --   --  8.0*  --   --   --   HGB 11.7* 9.9* 10.5* 9.1* 9.2* 8.6* 8.3*  HCT 39.3 32.2* 31.0* 31.0* 32.1* 28.1* 26.8*  MCV 75.3* 72.7*  --  75.6* 76.4* 72.2* 70.3*  PLT 464* 292  --  320 303  295 300    Cardiac Enzymes: No results for input(s): CKTOTAL, CKMB, CKMBINDEX, TROPONINI in the last 168 hours.  BNP: BNP (last 3 results) Recent Labs    07/16/19 0141 08/26/19 0441 08/31/19 0251  BNP 2,027.0* 497.7* 275.5*    ProBNP (last 3 results) No results for input(s): PROBNP in the last 8760 hours.    Other results:  Imaging: Ct Chest Wo Contrast  Result Date: 08/31/2019 CLINICAL DATA:  Interstitial edema and bilateral pleural effusions. Amiodarone therapy. CABG on 07/19/2019. EXAM: CT CHEST WITHOUT CONTRAST TECHNIQUE: Multidetector CT imaging of the chest was performed following the standard protocol without IV contrast. COMPARISON:  CT scan of the chest 07/17/2019 FINDINGS: Cardiovascular: The heart is normal in size. Extensive coronary artery calcifications. CABG. No pericardial effusion. Enlarged left atrium, but diminished in size since the prior CT scan. Aortic atherosclerosis. The pulmonary vascularity appears normal. Mediastinum/Nodes: Endotracheal tube in good position. NG tube tip below the diaphragm. Lungs/Pleura: The patient has large bilateral pleural  effusions, markedly increased since the prior study. There is compressive complete atelectasis of both lower lobes. There small peripheral areas of infiltrate in both upper lobes. The pattern is not typical for pulmonary edema. There is no appreciable interstitial edema. Upper Abdomen: 2.7 cm low-density exophytic lesion on the upper pole of the left kidney, likely a cyst. No other abnormality of the upper abdomen. Musculoskeletal: No acute bone abnormality. Recent median sternotomy. IMPRESSION: 1. Progressive large bilateral pleural effusions with compressive complete atelectasis of both lower lobes. 2. Small peripheral areas of infiltrate in both upper lobes. The pattern is not typical for pulmonary edema. Amiodarone toxicity as a variety of patterns and could be responsible for the patchy infiltrates but the effusions would be atypical. Aortic Atherosclerosis (ICD10-I70.0). Electronically Signed   By: Lorriane Shire M.D.   On: 08/31/2019 16:22   Dg Chest Port 1 View  Result Date: 08/31/2019 CLINICAL DATA:  Respiratory failure EXAM: PORTABLE CHEST 1 VIEW COMPARISON:  August 30, 2019 FINDINGS: The mediastinal contour and cardiac silhouette are stable. Endotracheal tube is identified with distal tip 1.5 cm from carina. Nasogastric tube is identified unchanged. Consolidation of bilateral mid and lung bases are identified unchanged. Interstitial edema is identified unchanged. Moderate bilateral pleural effusions are unchanged. No pneumothorax. IMPRESSION: Persistent interstitial edema with consolidation of bilateral lungs and bilateral pleural effusions unchanged compared prior exam. Electronically Signed   By: Abelardo Diesel M.D.   On: 08/31/2019 07:20     Medications:     Scheduled Medications:  aspirin  81 mg Per Tube Daily   atorvastatin  40 mg Oral q1800   chlorhexidine gluconate (MEDLINE KIT)  15 mL Mouth Rinse BID   Chlorhexidine Gluconate Cloth  6 each Topical Daily   colchicine  0.6 mg  Oral BID   etomidate  20 mg Intravenous Once   famotidine  20 mg Per Tube Daily   insulin aspart  0-9 Units Subcutaneous Q4H   mouth rinse  15 mL Mouth Rinse 10 times per day   metoprolol tartrate  25 mg Per Tube BID   nitroGLYCERIN  0.4 mg Transdermal Daily   predniSONE  35 mg Per Tube Q breakfast   Followed by   Derrill Memo ON 09/15/2019] predniSONE  30 mg Per Tube Q breakfast   Followed by   Derrill Memo ON 09/22/2019] predniSONE  25 mg Per Tube Q breakfast   Followed by   Derrill Memo ON 09/29/2019] predniSONE  20 mg Per Tube Q breakfast   Followed  by   Derrill Memo ON 10/06/2019] predniSONE  15 mg Per Tube Q breakfast   Followed by   Derrill Memo ON 10/20/2019] predniSONE  10 mg Per Tube Q breakfast   Followed by   Derrill Memo ON 11/03/2019] predniSONE  5 mg Per Tube Q breakfast   QUEtiapine  25 mg Oral BID   spironolactone  12.5 mg Per Tube Daily   succinylcholine  100 mg Intravenous Once    Infusions:  amiodarone 30 mg/hr (09/01/19 0800)   feeding supplement (VITAL AF 1.2 CAL) Stopped (09/01/19 0000)   fentaNYL infusion INTRAVENOUS 5 mcg/hr (09/01/19 0600)   furosemide (LASIX) infusion 4 mg/hr (09/01/19 0840)   heparin 1,050 Units/hr (09/01/19 0800)    PRN Medications: acetaminophen, fentaNYL (SUBLIMAZE) injection, ipratropium-albuterol, midazolam, ondansetron (ZOFRAN) IV   Assessment/Plan:    1. Acute on chronic diastolic HF - Echo EF 15-61% - CT scan with large bilateral pleural effusions not responding to IV lasix. BUN/Cr rising. She does not currently look volume overloaded. I doubt major issue here is pulmonary edema. Would stop IV lasix. (If any question can place Swan to clearly elucidate)  2. Acute Hypoxic Respiratory Failure - currently seems mainly due to bilateral large pleural effusions which are not improving with diuresis - as above, I doubt this is primarily HF. May be amiodarone but effusions would be unusual - will send ESR - diagnostic tap of R effusion  suggests transudative/inflammatory - I do not think she will get better without drainage of bilateral effusions. Will d/w Dr. Lynetta Mare - Agree with steroids +/- colchicine  3. Paroxysmal AFL with RVR - new. Unclear duration. Suspect ~ 1 week - will stop amio given ? Possible toxicity.  - with normal EF can use cardizem for rate control as BP tolerates   - Had planned TEE/DC-CV today but not sure it will hold with severity of respiratory failure  4. Pleural effusions - see discussion above - Dr. Lynetta Mare and I discussed with Dr. Orvan Seen and he will place bilateral chest tubes  5. CAD s/p CABG - no s/s ischemia. Continue ASA/statin  6. Subacute CVA - Neuro has seen  7. Aortic insufficiency - Mild to moderate on echo. - pressure 1/2t 357mso probably at least moderate . Will reassess with TEE   CRITICAL CARE Performed by: BGlori Bickers Total critical care time: 45 minutes  Critical care time was exclusive of separately billable procedures and treating other patients.  Critical care was necessary to treat or prevent imminent or life-threatening deterioration.  Critical care was time spent personally by me (independent of midlevel providers or residents) on the following activities: development of treatment plan with patient and/or surrogate as well as nursing, discussions with consultants, evaluation of patient's response to treatment, examination of patient, obtaining history from patient or surrogate, ordering and performing treatments and interventions, ordering and review of laboratory studies, ordering and review of radiographic studies, pulse oximetry and re-evaluation of patient's condition.    Length of Stay: 6   DGlori BickersMD 09/01/2019, 9:01 AM  Advanced Heart Failure Team Pager [phone](M-F; 7[place]  Please contact [email or web address] for night-coverage after hours (4p -7a ) and weekends on [email or web address]

## 2019-09-01 NOTE — Progress Notes (Signed)
ANTICOAGULATION CONSULT NOTE - Follow Up Consult  Pharmacy Consult for heparin Indication: atrial fibrillation  No Known Allergies  Patient Measurements: Height: '5\' 4"'$  (162.6 cm) Weight: 152 lb 1.9 oz (69 kg) IBW/kg (Calculated) : 54.7 Heparin Dosing Weight: 63.6  Vital Signs: BP: 98/64 (10/30 0600) Pulse Rate: 98 (10/30 0600)  Labs: Recent Labs    08/29/19 1153  08/30/19 0320  08/30/19 1839 08/31/19 0251 09/01/19 0326  HGB  --    < > 9.2*  --   --  8.6* 8.3*  HCT  --   --  32.1*  --   --  28.1* 26.8*  PLT  --   --  303  --   --  295 300  APTT 28  --   --   --   --   --   --   HEPARINUNFRC <0.10*   < >  --    < > 0.44 0.35 0.33  CREATININE 0.52  --  0.75  --   --  0.99 1.14*   < > = values in this interval not displayed.    Estimated Creatinine Clearance: 33.8 mL/min (A) (by C-G formula based on SCr of 1.14 mg/dL (H)).   Medical History: Past Medical History:  Diagnosis Date  . CAD (coronary artery disease)    9/14 LHC with EF 25-35%, mod elevated LVEDP, dLM 90%, pLCx80%, pLAD to mLAD 90%, 1st diag 80%, mid Cx 100%, pRCA 90%, pRCA to mRCA 100%  . Heart failure with reduced ejection fraction ([place])    a) 9/13 echo with EF 30-35%, mild LVF, diffuse hypokinesis, mod aortic calcification, mild AR  . Hypertension   . Rheumatoid arthritis ([facility])     Medications:  Scheduled:  . aspirin  81 mg Per Tube Daily  . atorvastatin  40 mg Oral q1800  . chlorhexidine gluconate (MEDLINE KIT)  15 mL Mouth Rinse BID  . Chlorhexidine Gluconate Cloth  6 each Topical Daily  . colchicine  0.6 mg Oral BID  . etomidate  20 mg Intravenous Once  . famotidine  20 mg Per Tube Daily  . insulin aspart  0-9 Units Subcutaneous Q4H  . mouth rinse  15 mL Mouth Rinse 10 times per day  . metoprolol tartrate  25 mg Per Tube BID  . nitroGLYCERIN  0.4 mg Transdermal Daily  . predniSONE  35 mg Per Tube Q breakfast   Followed by  . [START ON 09/15/2019] predniSONE  30 mg Per Tube Q breakfast   Followed by  . [START ON 09/22/2019] predniSONE  25 mg Per Tube Q breakfast   Followed by  . [START ON 09/29/2019] predniSONE  20 mg Per Tube Q breakfast   Followed by  . [START ON 10/06/2019] predniSONE  15 mg Per Tube Q breakfast   Followed by  . [START ON 10/20/2019] predniSONE  10 mg Per Tube Q breakfast   Followed by  . [START ON 11/03/2019] predniSONE  5 mg Per Tube Q breakfast  . QUEtiapine  25 mg Oral QHS  . spironolactone  12.5 mg Per Tube Daily  . succinylcholine  100 mg Intravenous Once   Infusions:  . amiodarone 30 mg/hr (09/01/19 0600)  . feeding supplement (VITAL AF 1.2 CAL) Stopped (09/01/19 0000)  . fentaNYL infusion INTRAVENOUS 5 mcg/hr (09/01/19 0600)  . furosemide (LASIX) infusion 10 mg/hr (08/31/19 0925)  . heparin 1,050 Units/hr (09/01/19 0600)    Assessment: 85 yo F admitted for AMS, found unresponsive at home and intubated  due to hypoxia. 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 Patient was started on apixaban for PAF but this was stopped on 10/16 after a fall on 10/5. Pharmacy consulted to initiate IV heparin for PAF with possible cardioversion this admission - plan for 10/30 while still intubated. Target lower heparin goals with possible stroke.  Heparin level is within goal range at 0.33. Hgb stable 8.3, pltc wnl. No signs of bleeding.  Goal of Therapy:  Heparin level 0.3-0.5 units/ml Monitor platelets by anticoagulation protocol: Yes   Plan:  Continue IV heparin at 1050 units/hr  Daily heparin level, CBC Monitor for bleeding Follow up long term anticoag plans  Vertis Kelch, PharmD PGY2 Cardiology Pharmacy Resident Phone [phone] 09/01/2019       7:14 AM  Please check [email or web address] for unit-specific pharmacist phone numbers

## 2019-09-02 ENCOUNTER — Inpatient Hospital Stay: Payer: Self-pay

## 2019-09-02 ENCOUNTER — Other Ambulatory Visit: Payer: Self-pay

## 2019-09-02 DIAGNOSIS — I509 Heart failure, unspecified: Secondary | ICD-10-CM

## 2019-09-02 DIAGNOSIS — Z9911 Dependence on respirator [ventilator] status: Secondary | ICD-10-CM

## 2019-09-02 DIAGNOSIS — J9601 Acute respiratory failure with hypoxia: Secondary | ICD-10-CM | POA: Diagnosis not present

## 2019-09-02 LAB — CBC
HCT: 28.8 % — ABNORMAL LOW (ref 36.0–46.0)
Hemoglobin: 8.7 g/dL — ABNORMAL LOW (ref 12.0–15.0)
MCH: 21.3 pg — ABNORMAL LOW (ref 26.0–34.0)
MCHC: 30.2 g/dL (ref 30.0–36.0)
MCV: 70.4 fL — ABNORMAL LOW (ref 80.0–100.0)
Platelets: 414 10*3/uL — ABNORMAL HIGH (ref 150–400)
RBC: 4.09 MIL/uL (ref 3.87–5.11)
RDW: 17.4 % — ABNORMAL HIGH (ref 11.5–15.5)
WBC: 10.3 10*3/uL (ref 4.0–10.5)
nRBC: 0 % (ref 0.0–0.2)

## 2019-09-02 LAB — GLUCOSE, CAPILLARY
Glucose-Capillary: 109 mg/dL — ABNORMAL HIGH (ref 70–99)
Glucose-Capillary: 121 mg/dL — ABNORMAL HIGH (ref 70–99)
Glucose-Capillary: 131 mg/dL — ABNORMAL HIGH (ref 70–99)
Glucose-Capillary: 149 mg/dL — ABNORMAL HIGH (ref 70–99)
Glucose-Capillary: 154 mg/dL — ABNORMAL HIGH (ref 70–99)
Glucose-Capillary: 167 mg/dL — ABNORMAL HIGH (ref 70–99)

## 2019-09-02 LAB — BASIC METABOLIC PANEL
Anion gap: 11 (ref 5–15)
BUN: 80 mg/dL — ABNORMAL HIGH (ref 8–23)
CO2: 27 mmol/L (ref 22–32)
Calcium: 9.4 mg/dL (ref 8.9–10.3)
Chloride: 98 mmol/L (ref 98–111)
Creatinine, Ser: 1.12 mg/dL — ABNORMAL HIGH (ref 0.44–1.00)
GFR calc Af Amer: 52 mL/min — ABNORMAL LOW (ref >60)
GFR calc non Af Amer: 44 mL/min — ABNORMAL LOW (ref >60)
Glucose, Bld: 120 mg/dL — ABNORMAL HIGH (ref 70–99)
Potassium: 3.5 mmol/L (ref 3.5–5.1)
Sodium: 136 mmol/L (ref 135–145)

## 2019-09-02 LAB — HEPARIN LEVEL (UNFRACTIONATED): Heparin Unfractionated: 0.29 IU/mL — ABNORMAL LOW (ref 0.30–0.70)

## 2019-09-02 MED ORDER — NOREPINEPHRINE 4 MG/250ML-% IV SOLN
INTRAVENOUS | Status: AC
  Administered 2019-09-02: 10 ug/kg/min
  Filled 2019-09-02: qty 250

## 2019-09-02 MED ORDER — SODIUM CHLORIDE 0.9% FLUSH
10.0000 mL | Freq: Two times a day (BID) | INTRAVENOUS | Status: DC
  Administered 2019-09-02 – 2019-09-05 (×6): 10 mL

## 2019-09-02 MED ORDER — SODIUM CHLORIDE 0.9% FLUSH
10.0000 mL | INTRAVENOUS | Status: DC | PRN
  Administered 2019-09-05: 10 mL
  Filled 2019-09-02: qty 40

## 2019-09-02 MED ORDER — NOREPINEPHRINE 4 MG/250ML-% IV SOLN
0.0000 ug/min | INTRAVENOUS | Status: DC
  Administered 2019-09-03: 5 ug/min via INTRAVENOUS
  Filled 2019-09-02 (×2): qty 250

## 2019-09-02 NOTE — Progress Notes (Signed)
Peripherally Inserted Central Catheter/Midline Placement  The IV Nurse has discussed with the patient and/or persons authorized to consent for the patient, the purpose of this procedure and the potential benefits and risks involved with this procedure.  The benefits include less needle sticks, lab draws from the catheter, and the patient may be discharged home with the catheter. Risks include, but not limited to, infection, bleeding, blood clot (thrombus formation), and puncture of an artery; nerve damage and irregular heartbeat and possibility to perform a PICC exchange if needed/ordered by physician.  Alternatives to this procedure were also discussed.  Bard Power PICC patient education guide, fact sheet on infection prevention and patient information card has been provided to patient /or left at bedside.    PICC/Midline Placement Documentation  PICC Double Lumen 92/42/68 PICC Right Basilic 37 cm 0 cm (Active)  Indication for Insertion or Continuance of Line Poor Vasculature-patient has had multiple peripheral attempts or PIVs lasting less than 24 hours 09/02/19 1700  Exposed Catheter (cm) 0 cm 09/02/19 1700  Site Assessment Dry;Clean;Intact 09/02/19 1700  Lumen #1 Status Flushed;Blood return noted;Saline locked 09/02/19 1700  Lumen #2 Status Flushed;Blood return noted;Saline locked 09/02/19 1700  Dressing Type Transparent;Securing device 09/02/19 1700  Dressing Status Clean;Dry;Intact;Antimicrobial disc in place 09/02/19 1700  Dressing Change Due 09/09/19 09/02/19 1700       Lacey Coleman 09/02/2019, 5:04 PM

## 2019-09-02 NOTE — Progress Notes (Signed)
ANTICOAGULATION CONSULT NOTE - Follow Up Consult  Pharmacy Consult for heparin Indication: atrial fibrillation  No Known Allergies  Patient Measurements: Height: '5\' 4"'$  (162.6 cm) Weight: 149 lb 14.6 oz (68 kg) IBW/kg (Calculated) : 54.7 Heparin Dosing Weight: 63.6  Vital Signs: Temp: 98.5 F (36.9 C) (10/31 0800) Temp Source: Oral (10/31 0800) BP: 106/55 (10/31 1000) Pulse Rate: 109 (10/31 1000)  Labs: Recent Labs    08/31/19 0251 09/01/19 0326 09/02/19 0356  HGB 8.6* 8.3* 8.7*  HCT 28.1* 26.8* 28.8*  PLT 295 300 414*  HEPARINUNFRC 0.35 0.33 0.29*  CREATININE 0.99 1.14* 1.12*    Estimated Creatinine Clearance: 34.2 mL/min (A) (by C-G formula based on SCr of 1.12 mg/dL (H)).   Medical History: Past Medical History:  Diagnosis Date  . CAD (coronary artery disease)    9/14 LHC with EF 25-35%, mod elevated LVEDP, dLM 90%, pLCx80%, pLAD to mLAD 90%, 1st diag 80%, mid Cx 100%, pRCA 90%, pRCA to mRCA 100%  . Heart failure with reduced ejection fraction ([place])    a) 9/13 echo with EF 30-35%, mild LVF, diffuse hypokinesis, mod aortic calcification, mild AR  . Hypertension   . Rheumatoid arthritis (HCC)     Medications:  Scheduled:  . aspirin  81 mg Per Tube Daily  . atorvastatin  40 mg Oral q1800  . chlorhexidine gluconate (MEDLINE KIT)  15 mL Mouth Rinse BID  . Chlorhexidine Gluconate Cloth  6 each Topical Daily  . colchicine  0.6 mg Oral BID  . etomidate  20 mg Intravenous Once  . famotidine  20 mg Per Tube Daily  . insulin aspart  0-9 Units Subcutaneous Q4H  . mouth rinse  15 mL Mouth Rinse 10 times per day  . metoprolol tartrate  25 mg Per Tube BID  . nitroGLYCERIN  0.4 mg Transdermal Daily  . predniSONE  35 mg Per Tube Q breakfast   Followed by  . [START ON 09/15/2019] predniSONE  30 mg Per Tube Q breakfast   Followed by  . [START ON 09/22/2019] predniSONE  25 mg Per Tube Q breakfast   Followed by  . [START ON 09/29/2019] predniSONE  20 mg Per Tube Q  breakfast   Followed by  . [START ON 10/06/2019] predniSONE  15 mg Per Tube Q breakfast   Followed by  . [START ON 10/20/2019] predniSONE  10 mg Per Tube Q breakfast   Followed by  . [START ON 11/03/2019] predniSONE  5 mg Per Tube Q breakfast  . QUEtiapine  25 mg Oral BID  . succinylcholine  100 mg Intravenous Once   Infusions:  . feeding supplement (VITAL AF 1.2 CAL) 45 mL/hr at 09/02/19 0600  . fentaNYL infusion INTRAVENOUS 50 mcg/hr (09/02/19 0800)  . heparin 1,050 Units/hr (09/02/19 0800)  . phenylephrine (NEO-SYNEPHRINE) Adult infusion 20 mcg/min (09/02/19 0800)    Assessment: 85 yo F admitted for AMS, found unresponsive at home and intubated due to hypoxia. 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 Patient was started on apixaban for PAF but this was stopped on 10/16 after a fall on 10/5. Pharmacy consulted to initiate IV heparin for PAF with possible cardioversion this admission - plan for 10/30 while still intubated. Target lower heparin goals with possible stroke.  Heparin level is slightly below goal range at 0.29. Hgb stable 8.7, pltc wnl. No signs of bleeding.  Goal of Therapy:  Heparin level 0.3-0.5 units/ml Monitor platelets by anticoagulation protocol: Yes   Plan:  Increase IV heparin to 1150 units/hr  Daily heparin level, CBC Monitor for bleeding Follow up long term anticoag plans  Nicoletta Dress, PharmD PGY2 Infectious Disease Pharmacy Resident  09/02/2019       10:25 AM  Please check [email or web address] for unit-specific pharmacist phone numbers

## 2019-09-02 NOTE — Progress Notes (Signed)
Patient switched back to full support from wean due to agitation. MD and RN at bedside. MD put patient on 8 of PEEP. Vitals are stable. RT will continue to monitor.

## 2019-09-02 NOTE — Progress Notes (Signed)
At bedside for PICC placement.  Dr Valeta Harms at bedside performing echo. Obtained consent from daughter at bedside for PICC placement.  Once consent obtained, spoke with Tved RN re pt status.  Since last phone call, pt has had a drop BP, multiple MD's rounding on her.  Dr Valeta Harms, RN and this RN agree best to wait on PICC placement.   Daughter notified and verbalizes understanding.

## 2019-09-02 NOTE — Progress Notes (Signed)
NAME:  Lacey Coleman, MRN:  [id number], DOB:  1931-01-31, LOS: 7 ADMISSION DATE:  21-Sep-2019, CONSULTATION DATE: 08/26/2019 REFERRING MD: Dr. Elesa Massed, CHIEF COMPLAINT: Altered mental status  Brief History   85 year old lady found unresponsive at home, upon arrival by EMS, intubated.  History of present illness   History obtained from chart  85 yo F PMH CAD s/p CABG in 08/2019, CHF, HTN, RA, A fib (eliquis dc 1 week ago) who presents 10/23 with AMS. Patient was found unresponsive at home in chair, found by family. Upon EMS arrival, SpO2 30%.  EMS reported ability to follow commands en route after being placed on NRB; EMS reports degree of aphagia. In ED, patient not following commands, and is subsequently intubated due to hypoxia. Concern for CVA due to AMS; CT  H obtained and is concerning for acute vs subacute infarct. Neurology informally consulted by EDP and will see if MRI brain reveals acute infarct. Patient determined not to be candidate for tPA.   Per patient's daughter, patient experienced a fall during hospitalization approx 1 month ago and has been weak since discharge home.   Lactic acid 2.5, WBC 19.8, ABG 7.28/51/133. BMP pendng  COVID-19 neg Started of vanc and cefepime in ED   Past Medical History   Past Medical History:  Diagnosis Date  . CAD (coronary artery disease)    9/14 LHC with EF 25-35%, mod elevated LVEDP, dLM 90%, pLCx80%, pLAD to mLAD 90%, 1st diag 80%, mid Cx 100%, pRCA 90%, pRCA to mRCA 100%  . Heart failure with reduced ejection fraction (HCC)    a) 9/13 echo with EF 30-35%, mild LVF, diffuse hypokinesis, mod aortic calcification, mild AR  . Hypertension   . Rheumatoid arthritis (HCC)     Significant Hospital Events   Altered mental status Intubated for airway protection CT reveals acute versus subacute infarct MRI did reveal a stroke-subacute  Consults:  Neurology Cardiology.  Procedures:  10/24 endotracheal tube>>  Significant Diagnostic  Tests:  10/24 CXR> bilateral pleural effusions, bibasilar atelectasis vs infiltrate   10/24 CT H non con> 1. No acute intracranial hemorrhage. 2. Mild age-related atrophy and chronic microvascular ischemic changes. 3. Bilateral frontal white matter hypodense lesions, new since theprior CT of 08/07/2019 and concerning for areas of subacute or acute infarct. Clinical correlation is recommended. MRI may provide betterevaluation if clinically indicated.  10/24 MRI brain IMPRESSION: 1. 18 mm focus of diffusion abnormality involving the subcortical left frontal lobe, most consistent with evolving late subacute ischemic infarct. No associated hemorrhage or mass effect. 2. No other acute intracranial abnormality. 3. Underlying atrophy with mild chronic microvascular ischemic disease.  Micro Data:  10/24 SARS CoV2> neg   Antimicrobials:  10/24 vanc>  10/24 cefepime>   Interim history/subjective:   Patient seen this morning on rounds in the ICU.  Approximately around 11 AM patient developed acute episode of hypotension.  She had received some as needed's for sedation.  Was also having some difficulty with the ventilator.  Systolic pressures were in 160s and acutely dropped to systolic in the 40s.  Patient's color dramatically changed she also had agonal type respirations on mechanical support.  She was started on norepinephrine peripherally bedside echo revealed no evidence of pericardial effusion.  ECG with flutter.  Patient's pressure started to respond and she improved shortly after.  Unsure of the etiology of this episode.  Family at bedside.  Discussed her current status.  Also patient's cardiothoracic surgery team came by.  At this point  leaving chest tubes in place on suction.  Objective   Blood pressure (!) 106/55, pulse (!) 109, temperature 98.5 F (36.9 C), temperature source Oral, resp. rate (!) 24, height 5\' 4"  (1.626 m), weight 68 kg, SpO2 100 %.    Vent Mode: PSV;CPAP FiO2  (%):  [30 %] 30 % Set Rate:  [18 bmp] 18 bmp Vt Set:  [440 mL] 440 mL PEEP:  [5 cmH20] 5 cmH20 Pressure Support:  [12 cmH20] 12 cmH20 Plateau Pressure:  [20 cmH20-25 cmH20] 20 cmH20   Intake/Output Summary (Last 24 hours) at 09/02/2019 1029 Last data filed at 09/02/2019 0800 Gross per 24 hour  Intake 1815.09 ml  Output 5590 ml  Net -3774.91 ml   Filed Weights   08/31/19 0500 09/01/19 0500 09/02/19 0500  Weight: 68.2 kg 69 kg 68 kg    Examination: General: Frail elderly female, intubated on mechanical life support HENT: Endotracheal tube in place, NCAT Lungs: Clear to auscultation bilaterally no crackles no wheeze Cardiovascular: The rate and rhythm, S1-S2 no MRG Abdomen: Soft nontender nondistended bowel sounds present Extremities: No significant edema Neuro: Is able to move all extremities.  Prior to hypotensive episode was alert following commands.   Resolved Hospital Problem list    Assessment & Plan:   Acute hypoxemic respiratory failure requiring intubation mechanical ventilation. Bilateral pleural effusions drained status post bilateral chest tubes. Presumed related to heart failure, valvular disease discussed with cardiothoracic surgery.  Patient had a high prior to having her CABG. CT imaging with patchy infiltrates there was some concern for potential amiodarone induced lung toxicity based on imaging report -Plan for PICC line placement today per heart failure team. -Continue heart failure regimen per heart failure team. -On tapering dose of prednisone started a few days ago.  Acute metabolic encephalopathy CVA on MRI appear subacute -Had required as needed antipsychotics.  Was on Seroquel in the past -Holding Seroquel. -Remains on heparin drip  Heart failure with preserved. ejection fraction, aortic insufficiency -Plan TEE -For heart failure service  Bilateral large effusions Patient has underwent a thoracentesis a few days ago on one of the effusions  which revealed inflammatory markers however not all data was collected from this effusion to be able to help determine its etiology. It does have an elevated white blood cell count and it 77% lymphocyte predominant.  The LDH is only mildly elevated but the suspicion is that it is related to a chronic or post inflammatory effusion due to her recent surgery. -Bilateral chest tubes were placed by cardiothoracic surgery -At this time was started on steroids and colchicine.  Critically ill due atrial fibrillation with rapid ventricular response. Loss of atrial kick contributing to pulmonary edema in context of pre-existing diastolic dysfunction.  -Amiodarone was stopped due to concern for possible amiodarone induced lung toxicity.  Asymptomatic bactiuria. E coli present but urinalysis negative. -Antibiotics stopped. -Continue to observe fever and white blood cell count.  Goals of care: I have placed consultation for palliative care after discussing with cardiothoracic surgery.  Best practice:  Diet: Continue tube feeds Pain/Anxiety/Delirium protocol (if indicated): Fentanyl infusion and prn fentanyl VAP protocol (if indicated): In place DVT prophylaxis: Heparin IV  GI prophylaxis: Protonix Glucose control: Insulin SSC Mobility: Bedrest Code Status: Partial code Family Communication: Discussed with daughter at bedside 10/29 Disposition: ICU  Labs   CBC: Recent Labs  Lab 08/28/19 0830 08/30/19 0320 08/31/19 0251 09/01/19 0326 09/02/19 0356  WBC 12.1* 10.1 12.5* 11.5* 10.3  NEUTROABS 8.0*  --   --   --   --  HGB 9.1* 9.2* 8.6* 8.3* 8.7*  HCT 31.0* 32.1* 28.1* 26.8* 28.8*  MCV 75.6* 76.4* 72.2* 70.3* 70.4*  PLT 320 303 295 300 414*    Basic Metabolic Panel: Recent Labs  Lab 08/29/19 1153 08/30/19 0320 08/31/19 0251 09/01/19 0326 09/02/19 0356  NA 134* 134* 135 132* 136  K 4.3 3.8 4.1 3.8 3.5  CL 104 100 99 91* 98  CO2 24 27 23 25 27   GLUCOSE 161* 144* 149* 98 120*   BUN 31* 43* 62* 76* 80*  CREATININE 0.52 0.75 0.99 1.14* 1.12*  CALCIUM 9.9 9.9 10.0 9.7 9.4   GFR: Estimated Creatinine Clearance: 34.2 mL/min (A) (by C-G formula based on SCr of 1.12 mg/dL (H)). Recent Labs  Lab 08/30/19 0320 08/31/19 0251 09/01/19 0326 09/02/19 0356  WBC 10.1 12.5* 11.5* 10.3    Liver Function Tests: No results for input(s): AST, ALT, ALKPHOS, BILITOT, PROT, ALBUMIN in the last 168 hours. No results for input(s): LIPASE, AMYLASE in the last 168 hours. No results for input(s): AMMONIA in the last 168 hours.  ABG    Component Value Date/Time   PHART 7.443 08/26/2019 1305   PCO2ART 29.5 (L) 08/26/2019 1305   PO2ART 213.0 (H) 08/26/2019 1305   HCO3 20.2 08/26/2019 1305   TCO2 21 (L) 08/26/2019 1305   ACIDBASEDEF 3.0 (H) 08/26/2019 1305   O2SAT 100.0 08/26/2019 1305      This patient is critically ill with multiple organ system failure; which, requires frequent high complexity decision making, assessment, support, evaluation, and titration of therapies. This was completed through the application of advanced monitoring technologies and extensive interpretation of multiple databases. During this encounter critical care time was devoted to patient care services described in this note for 34 minutes.  Garner Nash, DO [facility] 09/02/2019 10:29 AM  Personal pager: [phone] If unanswered, please page CCM On-call: [phone]

## 2019-09-02 NOTE — Progress Notes (Signed)
Patient ID: Lacey Coleman, female   DOB: 05-23-1931, 85 y.o.   MRN: [id number]    Advanced Heart Failure Rounding Note   Subjective:    Remains on vent. She is off Lasix today.   She was on phenylephrine overnight but it has been stopped.   CT chest with peripheral patchy infiltrates and large bilateral pleural effusions with compressive atx. ESR 56. Amiodarone stopped. She is on prednisone and colchicine.   Now s/p bilateral chest tube placement on 10/30 with good drainage.  No pleural fluid protein or albumen sent, but pleural effusion LDH is low.  Reviewed with CCM, probably lymphocyte-predominant exudative effusion but incomplete data.   Objective:   Weight Range:  Vital Signs:   Temp:  [97.8 F (36.6 C)-98.5 F (36.9 C)] 98.5 F (36.9 C) (10/31 0800) Pulse Rate:  [74-109] 109 (10/31 1000) Resp:  [16-33] 24 (10/31 1000) BP: (56-152)/(37-77) 106/55 (10/31 1000) SpO2:  [100 %] 100 % (10/31 1000) FiO2 (%):  [30 %] 30 % (10/31 0800) Weight:  [68 kg] 68 kg (10/31 0500) Last BM Date: (PTA)  Weight change: Filed Weights   08/31/19 0500 09/01/19 0500 09/02/19 0500  Weight: 68.2 kg 69 kg 68 kg    Intake/Output:   Intake/Output Summary (Last 24 hours) at 09/02/2019 1011 Last data filed at 09/02/2019 0800 Gross per 24 hour  Intake 1815.09 ml  Output 5590 ml  Net -3774.91 ml     Physical Exam: General: NAD Neck: JVP difficult, no thyromegaly or thyroid nodule.  Lungs: Bilateral chest tubes, decreased BS at bases.  CV: Nondisplaced PMI.  Heart mildly tachy, irregular S1/S2, no S3/S4, 1/6 SEM RUSB.  No peripheral edema.   Abdomen: Soft, nontender, no hepatosplenomegaly, no distention.  Skin: Intact without lesions or rashes.  Neurologic: Awake on vent.  Extremities: No clubbing or cyanosis.  HEENT: Normal.   Telemetry: AFL 100s Personally reviewed   Labs: Basic Metabolic Panel: Recent Labs  Lab 08/29/19 1153 08/30/19 0320 08/31/19 0251 09/01/19 0326 09/02/19  0356  NA 134* 134* 135 132* 136  K 4.3 3.8 4.1 3.8 3.5  CL 104 100 99 91* 98  CO2 '24 27 23 25 27  '$ GLUCOSE 161* 144* 149* 98 120*  BUN 31* 43* 62* 76* 80*  CREATININE 0.52 0.75 0.99 1.14* 1.12*  CALCIUM 9.9 9.9 10.0 9.7 9.4    Liver Function Tests: No results for input(s): AST, ALT, ALKPHOS, BILITOT, PROT, ALBUMIN in the last 168 hours. No results for input(s): LIPASE, AMYLASE in the last 168 hours. No results for input(s): AMMONIA in the last 168 hours.  CBC: Recent Labs  Lab 08/28/19 0830 08/30/19 0320 08/31/19 0251 09/01/19 0326 09/02/19 0356  WBC 12.1* 10.1 12.5* 11.5* 10.3  NEUTROABS 8.0*  --   --   --   --   HGB 9.1* 9.2* 8.6* 8.3* 8.7*  HCT 31.0* 32.1* 28.1* 26.8* 28.8*  MCV 75.6* 76.4* 72.2* 70.3* 70.4*  PLT 320 303 295 300 414*    Cardiac Enzymes: No results for input(s): CKTOTAL, CKMB, CKMBINDEX, TROPONINI in the last 168 hours.  BNP: BNP (last 3 results) Recent Labs    07/16/19 0141 08/26/19 0441 08/31/19 0251  BNP 2,027.0* 497.7* 275.5*    ProBNP (last 3 results) No results for input(s): PROBNP in the last 8760 hours.    Other results:  Imaging: Dg Chest 1 View  Result Date: 09/01/2019 CLINICAL DATA:  Altered mental status.  Right pleural effusion. EXAM: CHEST  1 VIEW COMPARISON:  August 31, 2019. FINDINGS: Stable cardiomediastinal silhouette. Endotracheal and nasogastric tubes are unchanged in position. Status post coronary bypass graft. Bilateral chest tubes are noted without pneumothorax. Mild bibasilar subsegmental atelectasis is noted with small pleural effusions. Bony thorax is unremarkable. IMPRESSION: Stable support apparatus. Interval placement of bilateral chest tubes without pneumothorax. Bibasilar opacities noted on prior exam are significantly improved. Electronically Signed   By: Marijo Conception M.D.   On: 09/01/2019 12:04   Ct Chest Wo Contrast  Result Date: 08/31/2019 CLINICAL DATA:  Interstitial edema and bilateral pleural  effusions. Amiodarone therapy. CABG on 07/19/2019. EXAM: CT CHEST WITHOUT CONTRAST TECHNIQUE: Multidetector CT imaging of the chest was performed following the standard protocol without IV contrast. COMPARISON:  CT scan of the chest 07/17/2019 FINDINGS: Cardiovascular: The heart is normal in size. Extensive coronary artery calcifications. CABG. No pericardial effusion. Enlarged left atrium, but diminished in size since the prior CT scan. Aortic atherosclerosis. The pulmonary vascularity appears normal. Mediastinum/Nodes: Endotracheal tube in good position. NG tube tip below the diaphragm. Lungs/Pleura: The patient has large bilateral pleural effusions, markedly increased since the prior study. There is compressive complete atelectasis of both lower lobes. There small peripheral areas of infiltrate in both upper lobes. The pattern is not typical for pulmonary edema. There is no appreciable interstitial edema. Upper Abdomen: 2.7 cm low-density exophytic lesion on the upper pole of the left kidney, likely a cyst. No other abnormality of the upper abdomen. Musculoskeletal: No acute bone abnormality. Recent median sternotomy. IMPRESSION: 1. Progressive large bilateral pleural effusions with compressive complete atelectasis of both lower lobes. 2. Small peripheral areas of infiltrate in both upper lobes. The pattern is not typical for pulmonary edema. Amiodarone toxicity as a variety of patterns and could be responsible for the patchy infiltrates but the effusions would be atypical. Aortic Atherosclerosis (ICD10-I70.0). Electronically Signed   By: Lorriane Shire M.D.   On: 08/31/2019 16:22   [place] Ekg Site Rite  Result Date: 09/02/2019 If Site Rite image not attached, placement could not be confirmed due to current cardiac rhythm.    Medications:     Scheduled Medications: . aspirin  81 mg Per Tube Daily  . atorvastatin  40 mg Oral q1800  . chlorhexidine gluconate (MEDLINE KIT)  15 mL Mouth Rinse BID  .  Chlorhexidine Gluconate Cloth  6 each Topical Daily  . colchicine  0.6 mg Oral BID  . etomidate  20 mg Intravenous Once  . famotidine  20 mg Per Tube Daily  . insulin aspart  0-9 Units Subcutaneous Q4H  . mouth rinse  15 mL Mouth Rinse 10 times per day  . metoprolol tartrate  25 mg Per Tube BID  . nitroGLYCERIN  0.4 mg Transdermal Daily  . predniSONE  35 mg Per Tube Q breakfast   Followed by  . [START ON 09/15/2019] predniSONE  30 mg Per Tube Q breakfast   Followed by  . [START ON 09/22/2019] predniSONE  25 mg Per Tube Q breakfast   Followed by  . [START ON 09/29/2019] predniSONE  20 mg Per Tube Q breakfast   Followed by  . [START ON 10/06/2019] predniSONE  15 mg Per Tube Q breakfast   Followed by  . [START ON 10/20/2019] predniSONE  10 mg Per Tube Q breakfast   Followed by  . [START ON 11/03/2019] predniSONE  5 mg Per Tube Q breakfast  . QUEtiapine  25 mg Oral BID  . succinylcholine  100 mg Intravenous Once  Infusions: . feeding supplement (VITAL AF 1.2 CAL) 45 mL/hr at 09/02/19 0600  . fentaNYL infusion INTRAVENOUS 50 mcg/hr (09/02/19 0800)  . heparin 1,050 Units/hr (09/02/19 0800)  . phenylephrine (NEO-SYNEPHRINE) Adult infusion 20 mcg/min (09/02/19 0800)    PRN Medications: acetaminophen, fentaNYL (SUBLIMAZE) injection, ipratropium-albuterol, midazolam, ondansetron (ZOFRAN) IV   Assessment/Plan:    1. Acute on chronic diastolic HF - Echo EF 46-56% - Chest tubes now present bilaterally with good drainage of pleural effusions.  - Volume status very difficult discern by exam.  Discussed with Dr. Valeta Harms, will order PICC line to follow CVP.   2. Acute Hypoxic Respiratory Failure - Currently seems mainly due to bilateral large pleural effusions.  Now with bilateral chest tubes.  Fluid not fully analyzed yesterday, ?lymphocyte predominant exudate.  Effusions would be unusual as an amiodarone complication.  - CT with small peripheral areas of infiltrate both upper lobes.  ESR  56, not markedly high.  No definite diagnosis but amiodarone has been stopped due to concern for possible toxicity and she is on prednisone and colchicine.  - No antibiotics, afebrile   - ?Post-pericardiotomy syndrome => continue prednisone and colchicine.   3. Paroxysmal AFL with RVR - new. Unclear duration. Suspect ~ 1 week - Stopped amiodarone given possible toxicity.  - HR 100s, on metoprolol at low dose.  Will not increase with soft BP.   - Will need eventual TEE-guided DCCV.  She is on heparin gtt.   4. Pleural effusions - see discussion above  5. CAD s/p CABG - no s/s ischemia. Continue ASA/statin  6. Subacute CVA - Neuro has seen  7. Aortic insufficiency - Mild to moderate on echo. - pressure 1/2t 380mso probably at least moderate . Will reassess with TEE  8. Left neck mass - Noted on CT chest.  Probably multinodular goiter.   Discussed with Dr. IValeta Harms    CRITICAL CARE Performed by: DLoralie Champagne Total critical care time: 35 minutes  Critical care time was exclusive of separately billable procedures and treating other patients.  Critical care was necessary to treat or prevent imminent or life-threatening deterioration.  Critical care was time spent personally by me (independent of midlevel providers or residents) on the following activities: development of treatment plan with patient and/or surrogate as well as nursing, discussions with consultants, evaluation of patient's response to treatment, examination of patient, obtaining history from patient or surrogate, ordering and performing treatments and interventions, ordering and review of laboratory studies, ordering and review of radiographic studies, pulse oximetry and re-evaluation of patient's condition.    Length of Stay: 7   DLoralie ChampagneMD 09/02/2019, 10:11 AM  Advanced Heart Failure Team Pager [phone](M-F; 7a - 4p)  Please contact [facility] for night-coverage after hours (4p -7a ) and  weekends on [email or web address]

## 2019-09-02 NOTE — Progress Notes (Signed)
[address]       [place] [address]             [phone]         Subjective: Sedated on vent  Objective: Vital signs in last 24 hours: Temp:  [98 F (36.7 C)-98.5 F (36.9 C)] 98.5 F (36.9 C) (10/31 0800) Pulse Rate:  [74-109] 109 (10/31 1000) Cardiac Rhythm: Atrial flutter (10/31 0800) Resp:  [16-33] 24 (10/31 1000) BP: (56-152)/(37-77) 106/55 (10/31 1000) SpO2:  [70 %-100 %] 70 % (10/31 1103) FiO2 (%):  [30 %-50 %] 50 % (10/31 1103) Weight:  [68 kg] 68 kg (10/31 0500)  Hemodynamic parameters for last 24 hours:    Intake/Output from previous day: 10/30 0701 - 10/31 0700 In: 1882.5 [I.V.:1077.5; NG/GT:805] Out: 5885 [Urine:1775; Chest Tube:4110] Intake/Output this shift: Total I/O In: 102.9 [I.V.:57.9; NG/GT:45] Out: 75 [Urine:75]  Sedated on vent  Lab Results: Recent Labs    09/01/19 0326 09/02/19 0356  WBC 11.5* 10.3  HGB 8.3* 8.7*  HCT 26.8* 28.8*  PLT 300 414*   BMET:  Recent Labs    09/01/19 0326 09/02/19 0356  NA 132* 136  K 3.8 3.5  CL 91* 98  CO2 25 27  GLUCOSE 98 120*  BUN 76* 80*  CREATININE 1.14* 1.12*  CALCIUM 9.7 9.4    PT/INR: No results for input(s): LABPROT, INR in the last 72 hours. ABG    Component Value Date/Time   PHART 7.443 08/26/2019 1305   HCO3 20.2 08/26/2019 1305   TCO2 21 (L) 08/26/2019 1305   ACIDBASEDEF 3.0 (H) 08/26/2019 1305   O2SAT 100.0 08/26/2019 1305   CBG (last 3)  Recent Labs    09/02/19 0035 09/02/19 0402 09/02/19 0745  GLUCAP 131* 109* 121*    Meds Scheduled Meds: . aspirin  81 mg Per Tube Daily  . atorvastatin  40 mg Oral q1800  . chlorhexidine gluconate (MEDLINE KIT)  15 mL Mouth Rinse BID  . Chlorhexidine Gluconate Cloth  6 each Topical Daily  . colchicine  0.6 mg Oral BID  . etomidate  20 mg Intravenous Once  . famotidine  20 mg Per Tube Daily  . insulin aspart  0-9 Units Subcutaneous Q4H  . mouth rinse  15 mL Mouth Rinse 10 times per day  . metoprolol  tartrate  25 mg Per Tube BID  . nitroGLYCERIN  0.4 mg Transdermal Daily  . predniSONE  35 mg Per Tube Q breakfast   Followed by  . [START ON 09/15/2019] predniSONE  30 mg Per Tube Q breakfast   Followed by  . [START ON 09/22/2019] predniSONE  25 mg Per Tube Q breakfast   Followed by  . [START ON 09/29/2019] predniSONE  20 mg Per Tube Q breakfast   Followed by  . [START ON 10/06/2019] predniSONE  15 mg Per Tube Q breakfast   Followed by  . [START ON 10/20/2019] predniSONE  10 mg Per Tube Q breakfast   Followed by  . [START ON 11/03/2019] predniSONE  5 mg Per Tube Q breakfast  . QUEtiapine  25 mg Oral BID  . succinylcholine  100 mg Intravenous Once   Continuous Infusions: . feeding supplement (VITAL AF 1.2 CAL) 45 mL/hr at 09/02/19 0600  . fentaNYL infusion INTRAVENOUS 50 mcg/hr (09/02/19 0800)  . heparin 1,150 Units/hr (09/02/19 1048)  . norepinephrine    . phenylephrine (NEO-SYNEPHRINE) Adult infusion 20 mcg/min (09/02/19 0800)   PRN Meds:.acetaminophen, fentaNYL (SUBLIMAZE)  injection, ipratropium-albuterol, midazolam, ondansetron (ZOFRAN) IV  Xrays Dg Chest 1 View  Result Date: 09/01/2019 CLINICAL DATA:  Altered mental status.  Right pleural effusion. EXAM: CHEST  1 VIEW COMPARISON:  August 31, 2019. FINDINGS: Stable cardiomediastinal silhouette. Endotracheal and nasogastric tubes are unchanged in position. Status post coronary bypass graft. Bilateral chest tubes are noted without pneumothorax. Mild bibasilar subsegmental atelectasis is noted with small pleural effusions. Bony thorax is unremarkable. IMPRESSION: Stable support apparatus. Interval placement of bilateral chest tubes without pneumothorax. Bibasilar opacities noted on prior exam are significantly improved. Electronically Signed   By: Marijo Conception M.D.   On: 09/01/2019 12:04   Ct Chest Wo Contrast  Result Date: 08/31/2019 CLINICAL DATA:  Interstitial edema and bilateral pleural effusions. Amiodarone therapy. CABG on  07/19/2019. EXAM: CT CHEST WITHOUT CONTRAST TECHNIQUE: Multidetector CT imaging of the chest was performed following the standard protocol without IV contrast. COMPARISON:  CT scan of the chest 07/17/2019 FINDINGS: Cardiovascular: The heart is normal in size. Extensive coronary artery calcifications. CABG. No pericardial effusion. Enlarged left atrium, but diminished in size since the prior CT scan. Aortic atherosclerosis. The pulmonary vascularity appears normal. Mediastinum/Nodes: Endotracheal tube in good position. NG tube tip below the diaphragm. Lungs/Pleura: The patient has large bilateral pleural effusions, markedly increased since the prior study. There is compressive complete atelectasis of both lower lobes. There small peripheral areas of infiltrate in both upper lobes. The pattern is not typical for pulmonary edema. There is no appreciable interstitial edema. Upper Abdomen: 2.7 cm low-density exophytic lesion on the upper pole of the left kidney, likely a cyst. No other abnormality of the upper abdomen. Musculoskeletal: No acute bone abnormality. Recent median sternotomy. IMPRESSION: 1. Progressive large bilateral pleural effusions with compressive complete atelectasis of both lower lobes. 2. Small peripheral areas of infiltrate in both upper lobes. The pattern is not typical for pulmonary edema. Amiodarone toxicity as a variety of patterns and could be responsible for the patchy infiltrates but the effusions would be atypical. Aortic Atherosclerosis (ICD10-I70.0). Electronically Signed   By: Lorriane Shire M.D.   On: 08/31/2019 16:22   [place] Ekg Site Rite  Result Date: 09/02/2019 If Site Rite image not attached, placement could not be confirmed due to current cardiac rhythm.   Assessment/Plan:  1 bilat pleural effusions s/p chest tubes placement- 3700cc out yesterday and 410 so far today, CXR shows improvement post procedure. Leave tubes for now 2 critically ill on vent with CCM and AHF team  managing   LOS: 7 days    Lacey Coleman [facility] 09/02/2019 Pager [phone]- not for patient use

## 2019-09-03 ENCOUNTER — Inpatient Hospital Stay (HOSPITAL_COMMUNITY): Payer: Medicare Other

## 2019-09-03 DIAGNOSIS — Z515 Encounter for palliative care: Secondary | ICD-10-CM

## 2019-09-03 DIAGNOSIS — J939 Pneumothorax, unspecified: Secondary | ICD-10-CM

## 2019-09-03 DIAGNOSIS — J9383 Other pneumothorax: Secondary | ICD-10-CM

## 2019-09-03 LAB — BODY FLUID CULTURE: Culture: NO GROWTH

## 2019-09-03 LAB — HEPARIN LEVEL (UNFRACTIONATED): Heparin Unfractionated: 0.59 IU/mL (ref 0.30–0.70)

## 2019-09-03 LAB — BASIC METABOLIC PANEL
Anion gap: 13 (ref 5–15)
BUN: 91 mg/dL — ABNORMAL HIGH (ref 8–23)
CO2: 24 mmol/L (ref 22–32)
Calcium: 9.8 mg/dL (ref 8.9–10.3)
Chloride: 99 mmol/L (ref 98–111)
Creatinine, Ser: 1.18 mg/dL — ABNORMAL HIGH (ref 0.44–1.00)
GFR calc Af Amer: 48 mL/min — ABNORMAL LOW (ref >60)
GFR calc non Af Amer: 42 mL/min — ABNORMAL LOW (ref >60)
Glucose, Bld: 128 mg/dL — ABNORMAL HIGH (ref 70–99)
Potassium: 3.6 mmol/L (ref 3.5–5.1)
Sodium: 136 mmol/L (ref 135–145)

## 2019-09-03 LAB — GLUCOSE, CAPILLARY
Glucose-Capillary: 122 mg/dL — ABNORMAL HIGH (ref 70–99)
Glucose-Capillary: 126 mg/dL — ABNORMAL HIGH (ref 70–99)
Glucose-Capillary: 130 mg/dL — ABNORMAL HIGH (ref 70–99)
Glucose-Capillary: 132 mg/dL — ABNORMAL HIGH (ref 70–99)
Glucose-Capillary: 139 mg/dL — ABNORMAL HIGH (ref 70–99)
Glucose-Capillary: 143 mg/dL — ABNORMAL HIGH (ref 70–99)

## 2019-09-03 LAB — CBC
HCT: 32.1 % — ABNORMAL LOW (ref 36.0–46.0)
Hemoglobin: 9.5 g/dL — ABNORMAL LOW (ref 12.0–15.0)
MCH: 21.4 pg — ABNORMAL LOW (ref 26.0–34.0)
MCHC: 29.6 g/dL — ABNORMAL LOW (ref 30.0–36.0)
MCV: 72.3 fL — ABNORMAL LOW (ref 80.0–100.0)
Platelets: 473 10*3/uL — ABNORMAL HIGH (ref 150–400)
RBC: 4.44 MIL/uL (ref 3.87–5.11)
RDW: 17.4 % — ABNORMAL HIGH (ref 11.5–15.5)
WBC: 16.2 10*3/uL — ABNORMAL HIGH (ref 4.0–10.5)
nRBC: 0 % (ref 0.0–0.2)

## 2019-09-03 MED ORDER — LIDOCAINE HCL (PF) 1 % IJ SOLN
INTRAMUSCULAR | Status: AC
  Administered 2019-09-03: 07:00:00
  Filled 2019-09-03: qty 30

## 2019-09-03 NOTE — Plan of Care (Signed)
Contacted by nursing that patient was noted to have ptx on CXR. Looked at film and confirmed this. This was large and not noted on previous films 2 days prior. No mediastinal or tracheal deviation noted. However patient on pressors and there could be some element of tensioning. Called daughter and attained consent for procedure. Despite patietn being DNR they would like to attempt to evacuate the air. Will attempt to make it so.   Newell Coral DO Internal Medicine/Pediatrics Pulmonary and Critical Care Fellow PGY-6

## 2019-09-03 NOTE — Progress Notes (Signed)
ANTICOAGULATION CONSULT NOTE - Follow Up Consult  Pharmacy Consult for heparin Indication: atrial fibrillation  No Known Allergies  Patient Measurements: Height: 5' 4" (162.6 cm) Weight: 152 lb 1.9 oz (69 kg) IBW/kg (Calculated) : 54.7 Heparin Dosing Weight: 63.6  Vital Signs: BP: 144/75 (11/01 0600) Pulse Rate: 100 (11/01 0600)  Labs: Recent Labs    09/01/19 0326 09/02/19 0356 09/03/19 0648  HGB 8.3* 8.7* 9.5*  HCT 26.8* 28.8* 32.1*  PLT 300 414* 473*  HEPARINUNFRC 0.33 0.29* 0.59  CREATININE 1.14* 1.12* 1.18*    Estimated Creatinine Clearance: 32.6 mL/min (A) (by C-G formula based on SCr of 1.18 mg/dL (H)).   Medical History: Past Medical History:  Diagnosis Date  . CAD (coronary artery disease)    9/14 LHC with EF 25-35%, mod elevated LVEDP, dLM 90%, pLCx80%, pLAD to mLAD 90%, 1st diag 80%, mid Cx 100%, pRCA 90%, pRCA to mRCA 100%  . Heart failure with reduced ejection fraction (HCC)    a) 9/13 echo with EF 30-35%, mild LVF, diffuse hypokinesis, mod aortic calcification, mild AR  . Hypertension   . Rheumatoid arthritis (HCC)     Medications:  Scheduled:  . aspirin  81 mg Per Tube Daily  . atorvastatin  40 mg Oral q1800  . chlorhexidine gluconate (MEDLINE KIT)  15 mL Mouth Rinse BID  . Chlorhexidine Gluconate Cloth  6 each Topical Daily  . colchicine  0.6 mg Oral BID  . famotidine  20 mg Per Tube Daily  . insulin aspart  0-9 Units Subcutaneous Q4H  . mouth rinse  15 mL Mouth Rinse 10 times per day  . predniSONE  35 mg Per Tube Q breakfast   Followed by  . [START ON 09/15/2019] predniSONE  30 mg Per Tube Q breakfast   Followed by  . [START ON 09/22/2019] predniSONE  25 mg Per Tube Q breakfast   Followed by  . [START ON 09/29/2019] predniSONE  20 mg Per Tube Q breakfast   Followed by  . [START ON 10/06/2019] predniSONE  15 mg Per Tube Q breakfast   Followed by  . [START ON 10/20/2019] predniSONE  10 mg Per Tube Q breakfast   Followed by  . [START ON  11/03/2019] predniSONE  5 mg Per Tube Q breakfast  . sodium chloride flush  10-40 mL Intracatheter Q12H   Infusions:  . feeding supplement (VITAL AF 1.2 CAL) 45 mL/hr at 09/03/19 0600  . fentaNYL infusion INTRAVENOUS 100 mcg/hr (09/03/19 0600)  . heparin 1,150 Units/hr (09/03/19 0600)  . norepinephrine (LEVOPHED) Adult infusion 5 mcg/min (09/03/19 0600)    Assessment: 85 yo F admitted for AMS, found unresponsive at home and intubated due to hypoxia. 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 Patient was started on apixaban for PAF but this was stopped on 10/16 after a fall on 10/5. Pharmacy consulted to initiate IV heparin for PAF with possible cardioversion this admission - plan for 10/30 while still intubated. Target lower heparin goals with possible stroke.  Heparin level is slightly below goal range at 0.59. Hgb stable 9.5, pltc wnl. No signs of bleeding. Will go back to 1050 units/hr, patient had been therapeutic on that before and was only slightly below goal yesterday  Goal of Therapy:  Heparin level 0.3-0.5 units/ml Monitor platelets by anticoagulation protocol: Yes   Plan:  Decrease IV heparin to 1050 units/hr  Daily heparin level, CBC Monitor for bleeding Follow up long term anticoag plans   , PharmD   PGY2 Infectious Disease Pharmacy Resident  09/03/2019       8:22 AM  Please check [email or web address] for unit-specific pharmacist phone numbers   

## 2019-09-03 NOTE — Procedures (Signed)
Chest Tube Insertion Procedure Note  Indications:  Clinically significant Pneumothorax  Pre-operative Diagnosis: Pneumothorax  Post-operative Diagnosis: Pneumothorax  Procedure Details  Informed consent was obtained for the procedure, including sedation.  Risks of lung perforation, hemorrhage, arrhythmia, and adverse drug reaction were discussed.   After sterile skin prep, using standard technique, a 14 French tube was placed in the right lateral 5 rib space.  Findings: 5 ml of purulent fluid obtained  Estimated Blood Loss:  Minimal         Specimens:  None              Complications:  None; patient tolerated the procedure well.         Disposition: ICU - intubated and critically ill.         Condition: stable  Attending Attestation: I was present and scrubbed for the entire procedure.

## 2019-09-03 NOTE — Consult Note (Signed)
Palliative Medicine   Name: Lacey Coleman Date: 09/03/2019 MRN: [id number]  DOB: 29-May-1931  Patient Care Team: Patient, No Pcp Per as PCP - General ([place]) Wellington Hampshire, MD as PCP - Cardiology (Cardiology)    REASON FOR CONSULTATION: Palliative Care consult requested for this 85 y.o. female with multiple medical problems including CAD status post recent CABG, cardiomyopathy with history of CHF, hypertension, RA, and A. fib.  Patient was recently hospitalized 07/17/2019 to 07/24/2019 with non-STEMI and acute systolic heart failure.  Patient underwent CABG. she is now readmitted 09/02/2019 with acute hypoxemic respiratory failure requiring intubation.  Her hospitalization has been complicated by acute CVA found on MRI, worsening bilateral pleural effusions requiring chest tubes, and PTX requiring a third chest tube.  Patient remains on ventilator.  Palliative care was consulted to help address goals.  SOCIAL HISTORY:     reports that she has never smoked. She has never used smokeless tobacco. She reports previous alcohol use. She reports previous drug use.   Patient is widowed.  She lived at home with a son prior to her most recent hospitalization.  She has since been living with her daughters.  Patient has 5 daughters and a son.  ADVANCE DIRECTIVES:  On file.  Patient's daughters Elgie Congo and Langley Gauss are her HC POA's.  CODE STATUS: Limited code  PAST MEDICAL HISTORY: Past Medical History:  Diagnosis Date   CAD (coronary artery disease)    9/14 LHC with EF 25-35%, mod elevated LVEDP, dLM 90%, pLCx80%, pLAD to mLAD 90%, 1st diag 80%, mid Cx 100%, pRCA 90%, pRCA to mRCA 100%   Heart failure with reduced ejection fraction (HCC)    a) 9/13 echo with EF 30-35%, mild LVF, diffuse hypokinesis, mod aortic calcification, mild AR   Hypertension    Rheumatoid arthritis ([place])     PAST SURGICAL HISTORY:  Past Surgical History:  Procedure Laterality Date   CORONARY ARTERY  BYPASS GRAFT N/A 07/19/2019   Procedure: CORONARY ARTERY BYPASS GRAFTING (CABG) x 2; Using Left Internal Mammary Artery (LIMA) and Right Leg Greater Saphenous Vein (SVG) harvested endoscopically; LIMA to LAD, SVG to Circ.;  Surgeon: Wonda Olds, MD;  Location: [facility];  Service: Open Heart Surgery;  Laterality: N/A;   RIGHT/LEFT HEART CATH AND CORONARY ANGIOGRAPHY N/A 07/17/2019   Procedure: RIGHT/LEFT HEART CATH AND CORONARY ANGIOGRAPHY;  Surgeon: Wellington Hampshire, MD;  Location: [facility];  Service: Cardiovascular;  Laterality: N/A;   TEE WITHOUT CARDIOVERSION N/A 07/19/2019   Procedure: TRANSESOPHAGEAL ECHOCARDIOGRAM (TEE);  Surgeon: Wonda Olds, MD;  Location: [facility];  Service: Open Heart Surgery;  Laterality: N/A;   TONSILLECTOMY      HEMATOLOGY/ONCOLOGY HISTORY:  Oncology History   No history exists.    ALLERGIES:  has No Known Allergies.  MEDICATIONS:  Current Facility-Administered Medications  Medication Dose Route Frequency Provider Last Rate Last Dose   acetaminophen (TYLENOL) tablet 650 mg  650 mg Oral Q4H PRN Bowser, Laurel Dimmer, NP       aspirin chewable tablet 81 mg  81 mg Per Tube Daily Olalere, Adewale A, MD   81 mg at 09/03/19 0900   atorvastatin (LIPITOR) tablet 40 mg  40 mg Oral q1800 Kipp Brood, MD   40 mg at 09/02/19 1718   chlorhexidine gluconate (MEDLINE KIT) (PERIDEX) 0.12 % solution 15 mL  15 mL Mouth Rinse BID Scatliffe, Rise Paganini, MD   15 mL at 09/03/19 0800   Chlorhexidine Gluconate Cloth  2 % PADS 6 each  6 each Topical Daily Scatliffe, Rise Paganini, MD   6 each at 09/02/19 1230   colchicine tablet 0.6 mg  0.6 mg Oral BID Kipp Brood, MD   0.6 mg at 09/03/19 0900   famotidine (PEPCID) 40 MG/5ML suspension 20 mg  20 mg Per Tube Daily Agarwala, Einar Grad, MD   20 mg at 09/03/19 0900   feeding supplement (VITAL AF 1.2 CAL) liquid 1,000 mL  1,000 mL Per Tube Continuous Kipp Brood, MD 45 mL/hr at 09/03/19 0600     fentaNYL (SUBLIMAZE)  injection 25 mcg  25 mcg Intravenous Q1H PRN Kipp Brood, MD   25 mcg at 09/01/19 1753   fentaNYL 2512mg in NS 2528m(1053mml) infusion-PREMIX  0-400 mcg/hr Intravenous Continuous Ward, Kristen N, DO 10 mL/hr at 09/03/19 0600 100 mcg/hr at 09/03/19 0600   heparin ADULT infusion 100 units/mL (25000 units/250m35mdium chloride 0.45%)  1,050 Units/hr Intravenous Continuous Agarwala, RaviEinar Grad 10.5 mL/hr at 09/03/19 0909 1,050 Units/hr at 09/03/19 0909   insulin aspart (novoLOG) injection 0-9 Units  0-9 Units Subcutaneous Q4H Bowser, GracLaurel Dimmer   1 Units at 09/03/19 0900   ipratropium-albuterol (DUONEB) 0.5-2.5 (3) MG/3ML nebulizer solution 3 mL  3 mL Nebulization Q6H PRN AvenJudd Lien   3 mL at 09/02/19 1041   MEDLINE mouth rinse  15 mL Mouth Rinse 10 times per day ScatSeward CarolMD   15 mL at 09/03/19 1000   midazolam (VERSED) injection 2 mg  2 mg Intravenous Q2H PRN Yap,Elsie Lincoln   1 mg at 09/02/19 1306   norepinephrine (LEVOPHED) '4mg'$  in 250mL84mmix infusion  0-40 mcg/min Intravenous Titrated Icard, Bradley L, DO 18.75 mL/hr at 09/03/19 0600 5 mcg/min at 09/03/19 0600   ondansetron (ZOFRAN) injection 4 mg  4 mg Intravenous Q6H PRN Bowser, GraceLaurel Dimmer      predniSONE 5 MG/5ML solution 35 mg  35 mg Per Tube Q breakfast Agarwala, Ravi,Einar Grad  35 mg at 09/03/19 0900   Followed by   [STARDerrill Memo1/13/2020] predniSONE 5 MG/5ML solution 30 mg  30 mg Per Tube Q breakfast Agarwala, Ravi,Einar Grad      Followed by   [STARDerrill Memo1/20/2020] predniSONE 5 MG/5ML solution 25 mg  25 mg Per Tube Q breakfast Agarwala, Ravi,Einar Grad      Followed by   [STARDerrill Memo1/27/2020] predniSONE 5 MG/5ML solution 20 mg  20 mg Per Tube Q breakfast Agarwala, Ravi,Einar Grad      Followed by   [STARDerrill Memo2/02/2019] predniSONE 5 MG/5ML solution 15 mg  15 mg Per Tube Q breakfast Agarwala, Ravi,Einar Grad      Followed by   [STARDerrill Memo2/18/2020] predniSONE 5 MG/5ML solution 10 mg  10 mg Per Tube Q breakfast Agarwala, Ravi,Einar Grad       Followed by   [STARDerrill Memo/11/2019] predniSONE 5 MG/5ML solution 5 mg  5 mg Per Tube Q breakfast Agarwala, Ravi, MD       sodium chloride flush (NS) 0.9 % injection 10-40 mL  10-40 mL Intracatheter Q12H Agarwala, Ravi, MD   10 mL at 09/02/19 2208   sodium chloride flush (NS) 0.9 % injection 10-40 mL  10-40 mL Intracatheter PRN AgarwKipp Brood       VITAL SIGNS: BP (!) 144/75    Pulse 100    Temp 98 F (36.7 C) (Oral)    Resp (!)Marland Kitchen  22    Ht '5\' 4"'$  (1.626 m)    Wt 152 lb 1.9 oz (69 kg)    SpO2 100%    BMI 26.11 kg/m  Filed Weights   09/01/19 0500 09/02/19 0500 09/03/19 0500  Weight: 152 lb 1.9 oz (69 kg) 149 lb 14.6 oz (68 kg) 152 lb 1.9 oz (69 kg)    Estimated body mass index is 26.11 kg/m as calculated from the following:   Height as of this encounter: '5\' 4"'$  (1.626 m).   Weight as of this encounter: 152 lb 1.9 oz (69 kg).  LABS: CBC:    Component Value Date/Time   WBC 16.2 (H) 09/03/2019 0648   HGB 9.5 (L) 09/03/2019 0648   HCT 32.1 (L) 09/03/2019 0648   PLT 473 (H) 09/03/2019 0648   MCV 72.3 (L) 09/03/2019 0648   NEUTROABS 8.0 (H) 08/28/2019 0830   LYMPHSABS 2.2 08/28/2019 0830   MONOABS 1.7 (H) 08/28/2019 0830   EOSABS 0.0 08/28/2019 0830   BASOSABS 0.1 08/28/2019 0830   Comprehensive Metabolic Panel:    Component Value Date/Time   NA 136 09/03/2019 0648   K 3.6 09/03/2019 0648   CL 99 09/03/2019 0648   CO2 24 09/03/2019 0648   BUN 91 (H) 09/03/2019 0648   CREATININE 1.18 (H) 09/03/2019 0648   GLUCOSE 128 (H) 09/03/2019 0648   CALCIUM 9.8 09/03/2019 0648   AST 28 08/26/2019 0441   ALT 21 08/26/2019 0441   ALKPHOS 110 08/26/2019 0441   BILITOT 1.5 (H) 08/26/2019 0441   PROT 4.8 (L) 08/26/2019 0441   ALBUMIN 2.5 (L) 08/26/2019 0441    RADIOGRAPHIC STUDIES: Dg Chest 1 View  Result Date: 09/01/2019 CLINICAL DATA:  Altered mental status.  Right pleural effusion. EXAM: CHEST  1 VIEW COMPARISON:  August 31, 2019. FINDINGS: Stable cardiomediastinal  silhouette. Endotracheal and nasogastric tubes are unchanged in position. Status post coronary bypass graft. Bilateral chest tubes are noted without pneumothorax. Mild bibasilar subsegmental atelectasis is noted with small pleural effusions. Bony thorax is unremarkable. IMPRESSION: Stable support apparatus. Interval placement of bilateral chest tubes without pneumothorax. Bibasilar opacities noted on prior exam are significantly improved. Electronically Signed   By: Marijo Conception M.D.   On: 09/01/2019 12:04   Dg Chest 2 View  Result Date: 08/18/2019 CLINICAL DATA:  CABG. EXAM: CHEST - 2 VIEW COMPARISON:  Chest x-ray 08/07/2019. FINDINGS: Prior CABG. Cardiomegaly with normal pulmonary vascularity. Progressive right base atelectasis. Persistent unchanged left base atelectasis/infiltrate. Bilateral moderate pleural effusion again noted without interim change. No pneumothorax. No acute bony abnormality. IMPRESSION: 1.  Prior CABG.  Cardiomegaly with normal pulmonary vascularity. 2. Progressive right base atelectasis. Persistent unchanged left base atelectasis/infiltrate. Moderate bilateral pleural effusions again noted without interim change. Electronically Signed   By: Marcello Moores  Register   On: 08/18/2019 15:23   Dg Shoulder Right  Result Date: 08/07/2019 CLINICAL DATA:  Right shoulder pain since a fall down stairs today. EXAM: RIGHT SHOULDER - 2+ VIEW COMPARISON:  None. FINDINGS: There is no fracture or dislocation. No significant arthritic changes. Diffuse osteopenia. Chronic blunting of the right costophrenic angle. CABG. IMPRESSION: No acute abnormality. Osteopenia. Chronic blunting of the right costophrenic angle. Electronically Signed   By: Lorriane Shire M.D.   On: 08/07/2019 11:24   Ct Head Wo Contrast  Result Date: 08/26/2019 CLINICAL DATA:  85 year old female with altered mental status. EXAM: CT HEAD WITHOUT CONTRAST TECHNIQUE: Contiguous axial images were obtained from the base of the skull  through the  vertex without intravenous contrast. COMPARISON:  Head CT dated 08/07/2019 FINDINGS: Brain: There is mild age-related atrophy and chronic microvascular ischemic changes. Bilateral basal ganglial old lacunar infarcts noted. Left frontal white matter hypodense focus (series 3 image 23), right frontal small hypodensity (series 3, image 23 appear new since the prior CT of 08/07/2019 and concerning for areas of subacute or acute infarct. An embolic infarct is not excluded. Clinical correlation is recommended. MRI may provide better evaluation. There is no acute intracranial hemorrhage. No mass effect or midline shift. No extra-axial fluid collection. Vascular: No hyperdense vessel or unexpected calcification. Skull: Normal. Negative for fracture or focal lesion. Sinuses/Orbits: No acute finding. Other: A support tube is partially visualized. IMPRESSION: 1. No acute intracranial hemorrhage. 2. Mild age-related atrophy and chronic microvascular ischemic changes. 3. Bilateral frontal white matter hypodense lesions, new since the prior CT of 08/07/2019 and concerning for areas of subacute or acute infarct. Clinical correlation is recommended. MRI may provide better evaluation if clinically indicated. These results were called by telephone at the time of interpretation on 08/26/2019 at 1:28 am to provider [facility] , who verbally acknowledged these results. Electronically Signed   By: Anner Crete M.D.   On: 08/26/2019 01:32   Ct Head Wo Contrast  Result Date: 08/07/2019 CLINICAL DATA:  Tripped and fell.  Landed on face and head. EXAM: CT HEAD WITHOUT CONTRAST CT MAXILLOFACIAL WITHOUT CONTRAST CT CERVICAL SPINE WITHOUT CONTRAST TECHNIQUE: Multidetector CT imaging of the head, cervical spine, and maxillofacial structures were performed using the standard protocol without intravenous contrast. Multiplanar CT image reconstructions of the cervical spine and maxillofacial structures were also generated.  COMPARISON:  Head CT 08/07/2019, CTA neck 07/20/2019 FINDINGS: CT HEAD FINDINGS Brain: No acute intracranial hemorrhage. No focal mass lesion. No CT evidence of acute infarction. No midline shift or mass effect. No hydrocephalus. Basilar cisterns are patent. There are periventricular and subcortical white matter hypodensities. Generalized cortical atrophy. Small remote lacunar infarctions in the head of the LEFT caudate nucleus and the RIGHT lentiform nucleus. Vascular: No hyperdense vessel or unexpected calcification. Skull: Normal. Negative for fracture or focal lesion. Sinuses/Orbits: Paranasal sinuses and mastoid air cells are clear. Orbits are clear. Other: Small scalp hematoma over the midline frontal bone. CT MAXILLOFACIAL FINDINGS Osseous: Orbital rims are intact. No fracture of the maxillary sinus walls. Pterygoid plates are normal. Mildly depressed fracture of the LEFT nasal bone (image 30/2). Mild flattening of the normal contour. Mandibular condyles are located.  No mandibular fracture contour. Orbits: Globes are intact. No proptosis. Intraconal contents are normal. Sinuses: Clear Soft tissues: Unremarkable CT CERVICAL SPINE FINDINGS Motion degradation most noted on the sagittal imaging. Alignment: Normal alignment of the cervical vertebral bodies. Skull base and vertebrae: Normal craniocervical junction. No loss of vertebral body height or disc height. Normal facet articulation. No evidence of fracture. Soft tissues and spinal canal: No prevertebral soft tissue swelling. No perispinal or epidural hematoma. Disc levels: Multiple levels of endplate spurring mL joint space narrowing. No acute findings Upper chest: Bilateral layering pleural effusions. Other: Large LEFT thyroid goiter measuring up to 4.5 cm not changed from comparison CTA. IMPRESSION: 1. No acute intracranial findings. Small scalp hematoma over the midline frontal bone. 2. Atrophy and white matter microvascular disease. Remote basal ganglia  lacunar infarctions. 3. Mildly depressed LEFT NASAL BONE FRACTURE. 4. No additional facial bone fracture. 5. No acute cervical spine fracture. Mild to moderate motion degradation. 6. Bilateral pleural effusions.  Consider chest radiograph. Electronically Signed  By: Suzy Bouchard M.D.   On: 08/07/2019 11:28   Ct Chest Wo Contrast  Result Date: 08/31/2019 CLINICAL DATA:  Interstitial edema and bilateral pleural effusions. Amiodarone therapy. CABG on 07/19/2019. EXAM: CT CHEST WITHOUT CONTRAST TECHNIQUE: Multidetector CT imaging of the chest was performed following the standard protocol without IV contrast. COMPARISON:  CT scan of the chest 07/17/2019 FINDINGS: Cardiovascular: The heart is normal in size. Extensive coronary artery calcifications. CABG. No pericardial effusion. Enlarged left atrium, but diminished in size since the prior CT scan. Aortic atherosclerosis. The pulmonary vascularity appears normal. Mediastinum/Nodes: Endotracheal tube in good position. NG tube tip below the diaphragm. Lungs/Pleura: The patient has large bilateral pleural effusions, markedly increased since the prior study. There is compressive complete atelectasis of both lower lobes. There small peripheral areas of infiltrate in both upper lobes. The pattern is not typical for pulmonary edema. There is no appreciable interstitial edema. Upper Abdomen: 2.7 cm low-density exophytic lesion on the upper pole of the left kidney, likely a cyst. No other abnormality of the upper abdomen. Musculoskeletal: No acute bone abnormality. Recent median sternotomy. IMPRESSION: 1. Progressive large bilateral pleural effusions with compressive complete atelectasis of both lower lobes. 2. Small peripheral areas of infiltrate in both upper lobes. The pattern is not typical for pulmonary edema. Amiodarone toxicity as a variety of patterns and could be responsible for the patchy infiltrates but the effusions would be atypical. Aortic Atherosclerosis  (ICD10-I70.0). Electronically Signed   By: Lorriane Shire M.D.   On: 08/31/2019 16:22   Ct Cervical Spine Wo Contrast  Result Date: 08/07/2019 CLINICAL DATA:  Tripped and fell.  Landed on face and head. EXAM: CT HEAD WITHOUT CONTRAST CT MAXILLOFACIAL WITHOUT CONTRAST CT CERVICAL SPINE WITHOUT CONTRAST TECHNIQUE: Multidetector CT imaging of the head, cervical spine, and maxillofacial structures were performed using the standard protocol without intravenous contrast. Multiplanar CT image reconstructions of the cervical spine and maxillofacial structures were also generated. COMPARISON:  Head CT 08/07/2019, CTA neck 07/20/2019 FINDINGS: CT HEAD FINDINGS Brain: No acute intracranial hemorrhage. No focal mass lesion. No CT evidence of acute infarction. No midline shift or mass effect. No hydrocephalus. Basilar cisterns are patent. There are periventricular and subcortical white matter hypodensities. Generalized cortical atrophy. Small remote lacunar infarctions in the head of the LEFT caudate nucleus and the RIGHT lentiform nucleus. Vascular: No hyperdense vessel or unexpected calcification. Skull: Normal. Negative for fracture or focal lesion. Sinuses/Orbits: Paranasal sinuses and mastoid air cells are clear. Orbits are clear. Other: Small scalp hematoma over the midline frontal bone. CT MAXILLOFACIAL FINDINGS Osseous: Orbital rims are intact. No fracture of the maxillary sinus walls. Pterygoid plates are normal. Mildly depressed fracture of the LEFT nasal bone (image 30/2). Mild flattening of the normal contour. Mandibular condyles are located.  No mandibular fracture contour. Orbits: Globes are intact. No proptosis. Intraconal contents are normal. Sinuses: Clear Soft tissues: Unremarkable CT CERVICAL SPINE FINDINGS Motion degradation most noted on the sagittal imaging. Alignment: Normal alignment of the cervical vertebral bodies. Skull base and vertebrae: Normal craniocervical junction. No loss of vertebral body  height or disc height. Normal facet articulation. No evidence of fracture. Soft tissues and spinal canal: No prevertebral soft tissue swelling. No perispinal or epidural hematoma. Disc levels: Multiple levels of endplate spurring mL joint space narrowing. No acute findings Upper chest: Bilateral layering pleural effusions. Other: Large LEFT thyroid goiter measuring up to 4.5 cm not changed from comparison CTA. IMPRESSION: 1. No acute intracranial findings. Small scalp hematoma over  the midline frontal bone. 2. Atrophy and white matter microvascular disease. Remote basal ganglia lacunar infarctions. 3. Mildly depressed LEFT NASAL BONE FRACTURE. 4. No additional facial bone fracture. 5. No acute cervical spine fracture. Mild to moderate motion degradation. 6. Bilateral pleural effusions.  Consider chest radiograph. Electronically Signed   By: Suzy Bouchard M.D.   On: 08/07/2019 11:28   Mr Brain Wo Contrast  Result Date: 08/26/2019 CLINICAL DATA:  Initial evaluation for acute unresponsiveness. EXAM: MRI HEAD WITHOUT CONTRAST TECHNIQUE: Multiplanar, multiecho pulse sequences of the brain and surrounding structures were obtained without intravenous contrast. COMPARISON:  Prior CT from earlier the same day. FINDINGS: Brain: Generalized age-related cerebral atrophy. Mild patchy T2/FLAIR hyperintensity within the periventricular deep white matter both cerebral hemispheres most consistent with chronic small vessel ischemic disease, mild in nature. Few scatter remote lacunar infarct present within the bilateral basal ganglia. 18 mm focus of diffusion abnormality involving the subcortical left frontal lobe most consistent with evolving late subacute ischemic infarct (series 5, image 78). No associated hemorrhage or mass effect. No other evidence for acute or subacute ischemia. Gray-white matter differentiation otherwise maintained with no other chronic or remote cortical infarction. No acute intracranial hemorrhage.  Few scattered chronic micro hemorrhages noted. No mass lesion, midline shift or mass effect. No hydrocephalus. No extra-axial fluid collection. Pituitary gland within normal limits for suprasellar region normal. Midline structures intact. Vascular: Major intracranial vascular flow voids are maintained. Skull and upper cervical spine: Craniocervical junction within normal limits. Multilevel degenerative spondylosis noted within the upper cervical spine with resultant mild diffuse spinal stenosis. Bone marrow signal intensity within normal limits. No scalp soft tissue abnormality. Sinuses/Orbits: Globes orbital soft tissues within normal limits. Paranasal sinuses are clear. No mastoid effusion. Inner ear structures grossly normal. Other: None. IMPRESSION: 1. 18 mm focus of diffusion abnormality involving the subcortical left frontal lobe, most consistent with evolving late subacute ischemic infarct. No associated hemorrhage or mass effect. 2. No other acute intracranial abnormality. 3. Underlying atrophy with mild chronic microvascular ischemic disease. Electronically Signed   By: Jeannine Boga M.D.   On: 08/26/2019 07:08   Dg Chest Port 1 View  Result Date: 09/03/2019 CLINICAL DATA:  Mechanically assisted ventilation. EXAM: PORTABLE CHEST 1 VIEW COMPARISON:  09/01/2019 and subsequent chest radiograph obtained today. FINDINGS: Interval proximally 65% right pneumothorax without mediastinal shift. Interval less than 5% left apical pneumothorax. Interval loculated normal thoraces or air in dilated, tortuous esophagus and gastric fundus in the left lower lung zone, described in the subsequent chest radiograph report. Right PICC tip in the mid superior vena cava. Stable post CABG changes, normal sized heart. Endotracheal tube tip 3.3 cm above the carina. This could be retracted 3.2 cm. The lung volumes remain hyperexpanded with mildly prominent interstitial markings and minimal atelectasis on the left. There is  also minimal patchy opacity in the left mid and upper lung zones, not previously present. IMPRESSION: 1. Interval approximately 65% right pneumothorax without mediastinal shift. This was seen clinically with a subsequent 2nd right chest tube placed. 2. Interval less than 5% left apical pneumothorax. 3. Interval loculated normal thoraces or air in dilated esophagus and gastric fundus in the left lower lung zone. 4. Endotracheal tube tip 3.3 cm above the carina. This could be retracted 3.2 cm. 5. COPD. 6. Mild left basilar atelectasis and minimal patchy opacity in the left mid and upper lung zones, possibly representing developing pneumonia. This is not seen on the subsequent portable Electronically Signed   By:  Claudie Revering M.D.   On: 09/03/2019 07:29   Dg Chest Port 1 View  Result Date: 09/03/2019 CLINICAL DATA:  Follow-up bilateral pneumothoraces EXAM: PORTABLE CHEST 1 VIEW COMPARISON:  Earlier today. Chest CT dated 08/31/2019. FINDINGS: Endotracheal tube tip 2.4 cm above the carina. This could be retracted 3.8 cm for better positioning. Less than 5% left apical pneumothorax with no significant change. Less than 5% right apical pneumothorax, significantly decreased in size. Bilateral chest tubes remain in place. A 2nd small caliber chest tube has been placed on the right. An elongated oval collection of air is again demonstrated medially in the left mid and lower lung zone. This may represent air within a dilated esophagus, previously shown to extend into this region on the CT. Loculated pleural air is also possible. A similar area is again demonstrated at the left lung base, possibly representing loculated pleural air or gas in the gastric fundus. Stable normal sized heart, tortuous calcified thoracic aorta and post CABG changes. The lungs remain hyperexpanded. A small left pleural effusion is noted. Right PICC tip in the superior vena cava. Diffuse osteopenia. IMPRESSION: 1. Less than 5% right apical  pneumothorax, significantly decreased in size. 2. Stable less than 5% left apical pneumothorax. 3. Interval small caliber right chest tube in addition to previously demonstrated bilateral chest tubes. 4. Stable oval collections of air in the left lower hemithorax, possibly representing intraluminal air or loculated pneumothoraces, as described above. 5. Stable changes of COPD. Electronically Signed   By: Claudie Revering M.D.   On: 09/03/2019 07:24   Dg Chest Port 1 View  Result Date: 08/31/2019 CLINICAL DATA:  Respiratory failure EXAM: PORTABLE CHEST 1 VIEW COMPARISON:  August 30, 2019 FINDINGS: The mediastinal contour and cardiac silhouette are stable. Endotracheal tube is identified with distal tip 1.5 cm from carina. Nasogastric tube is identified unchanged. Consolidation of bilateral mid and lung bases are identified unchanged. Interstitial edema is identified unchanged. Moderate bilateral pleural effusions are unchanged. No pneumothorax. IMPRESSION: Persistent interstitial edema with consolidation of bilateral lungs and bilateral pleural effusions unchanged compared prior exam. Electronically Signed   By: Abelardo Diesel M.D.   On: 08/31/2019 07:20   Dg Chest Port 1 View  Result Date: 08/30/2019 CLINICAL DATA:  CHF and pleural effusions. EXAM: PORTABLE CHEST 1 VIEW COMPARISON:  08/28/2019 FINDINGS: Endotracheal tube remains with the tip approximately 1 cm above the carina. Gastric decompression tube extends below the diaphragm. Some improved aeration noted at both lung bases with moderate bilateral pleural effusions remaining. Mild interstitial edema remains. The heart size is stable. No pneumothorax. IMPRESSION: 1. Some improved aeration at both lung bases with moderate bilateral pleural effusions remaining. Mild interstitial edema remains. 2. Endotracheal tube tip remains approximately 1 cm above the carina. Electronically Signed   By: Aletta Edouard M.D.   On: 08/30/2019 08:57   Dg Chest Port 1  View  Result Date: 08/28/2019 CLINICAL DATA:  Altered mental status which shortness-of-breath. EXAM: PORTABLE CHEST 1 VIEW COMPARISON:  08/26/2019 FINDINGS: Patient is rotated to the right. Endotracheal tube has tip 2.4 cm above the carina. Enteric tube courses into the region of the stomach and off the film as tip is not visualized. There is evidence of moderate size bilateral pleural effusions likely with associated bibasilar atelectasis as the right effusion is worse and left effusion not significantly changed. Remainder of the exam is unchanged. IMPRESSION: Continued moderate size bilateral pleural effusions with interval worsening of the right effusion and stable left effusion.  Likely associated bibasilar atelectasis. Tubes and lines as described. Electronically Signed   By: Marin Olp M.D.   On: 08/28/2019 11:29   Dg Chest Port 1 View  Result Date: 08/26/2019 CLINICAL DATA:  Endotracheal tube EXAM: PORTABLE CHEST 1 VIEW COMPARISON:  08/26/2019, 12:35 a.m. FINDINGS: No significant change in AP portable examination with small to moderate bilateral pleural effusions and associated atelectasis or consolidation. Endotracheal tube remains in essentially unchanged position, tip approximately 3 cm above the carina. Esophagogastric tube with tip and side port below the diaphragm. Cardiomegaly status post median sternotomy. IMPRESSION: 1. No significant change in AP portable examination with small to moderate bilateral pleural effusions and associated atelectasis or consolidation. 2. Endotracheal tube remains in essentially unchanged position, tip approximately 3 cm above the carina. Esophagogastric tube with tip and side port below the diaphragm. Electronically Signed   By: Eddie Candle M.D.   On: 08/26/2019 13:18   Dg Chest Portable 1 View  Result Date: 08/26/2019 CLINICAL DATA:  85 year old female status post intubation. EXAM: PORTABLE CHEST 1 VIEW COMPARISON:  Earlier radiograph dated 08/26/2019  FINDINGS: Interval placement of an endotracheal tube with tip approximately 2.5 cm above the carina. Enteric tube extends below the diaphragm with tip beyond the inferior margin of the image. Bilateral pleural effusions and associated bibasilar atelectasis although pneumonia is not excluded. No pneumothorax. Stable cardiac silhouette. Median sternotomy wires. No acute osseous pathology. IMPRESSION: 1. Interval placement of an endotracheal tube with tip above the carina. 2. Bilateral pleural effusions and associated bibasilar atelectasis/infiltrate. Electronically Signed   By: Anner Crete M.D.   On: 08/26/2019 00:45   Dg Chest Portable 1 View  Result Date: 08/26/2019 CLINICAL DATA:  Respiratory distress EXAM: PORTABLE CHEST 1 VIEW COMPARISON:  August 18, 2019 FINDINGS: There is mild cardiomegaly. Overlying median sternotomy wires. Aortic knob calcifications. Small to moderate bilateral pleural effusions are seen, slightly worsened on the prior exam. There is hazy airspace opacity at both lung bases which could be layering effusion and/or atelectasis. No acute osseous abnormality. IMPRESSION: Small to moderate bilateral pleural effusions with adjacent hazy airspace opacity which could be layering effusion and/or atelectasis. This is slightly increased in size from the prior exam. Electronically Signed   By: Prudencio Pair M.D.   On: 08/26/2019 00:32   Dg Chest Portable 1 View  Result Date: 08/07/2019 CLINICAL DATA:  Fall. EXAM: PORTABLE CHEST 1 VIEW COMPARISON:  Radiographs of July 28, 2019. FINDINGS: Stable cardiomediastinal silhouette. Atherosclerosis of thoracic aorta noted. Status post coronary bypass graft. No pneumothorax. Moderate bilateral pleural effusions are noted with underlying atelectasis or infiltrate. Bony thorax is unremarkable. IMPRESSION: Moderate bilateral pleural effusions with underlying bibasilar atelectasis or infiltrates. Aortic Atherosclerosis (ICD10-I70.0). Electronically  Signed   By: Marijo Conception M.D.   On: 08/07/2019 12:22   Dg Abd Portable 1v  Result Date: 08/26/2019 CLINICAL DATA:  NG tube placement EXAM: PORTABLE ABDOMEN - 1 VIEW COMPARISON:  None. FINDINGS: Tip the NG tube is seen projecting over the mid body of the stomach. No dilated loops of bowel are seen. Small bilateral pleural effusions. IMPRESSION: Tip of NG tube seen projecting over the mid body of the stomach. Electronically Signed   By: Prudencio Pair M.D.   On: 08/26/2019 01:41   Vas [place] Lower Extremity Venous (dvt)  Result Date: 08/26/2019  Lower Venous Study Indications: Swelling.  Comparison Study: No prior study. Performing Technologist: Maudry Mayhew MHA, RDMS, RVT, RDCS  Examination Guidelines: A complete evaluation includes B-mode  imaging, spectral Doppler, color Doppler, and power Doppler as needed of all accessible portions of each vessel. Bilateral testing is considered an integral part of a complete examination. Limited examinations for reoccurring indications may be performed as noted.  +---------+---------------+---------+-----------+----------+--------------+  RIGHT     Compressibility Phasicity Spontaneity Properties Thrombus Aging  +---------+---------------+---------+-----------+----------+--------------+  CFV                                                        Not visualized  +---------+---------------+---------+-----------+----------+--------------+  SFJ                                                        Not visualized  +---------+---------------+---------+-----------+----------+--------------+  FV Prox   Full                                                             +---------+---------------+---------+-----------+----------+--------------+  FV Mid    Full                                                             +---------+---------------+---------+-----------+----------+--------------+  FV Distal Full                                                              +---------+---------------+---------+-----------+----------+--------------+  PFV       Full                                                             +---------+---------------+---------+-----------+----------+--------------+  POP       Full            No        Yes                                    +---------+---------------+---------+-----------+----------+--------------+  PTV       Full                                                             +---------+---------------+---------+-----------+----------+--------------+  PERO      Full                                                             +---------+---------------+---------+-----------+----------+--------------+  +---------+---------------+---------+-----------+----------+--------------+  LEFT      Compressibility Phasicity Spontaneity Properties Thrombus Aging  +---------+---------------+---------+-----------+----------+--------------+  CFV       Full            No        Yes                                    +---------+---------------+---------+-----------+----------+--------------+  SFJ       Full                                                             +---------+---------------+---------+-----------+----------+--------------+  FV Prox   Full                                                             +---------+---------------+---------+-----------+----------+--------------+  FV Mid    Full                                                             +---------+---------------+---------+-----------+----------+--------------+  FV Distal Full                                                             +---------+---------------+---------+-----------+----------+--------------+  PFV       Full                                                             +---------+---------------+---------+-----------+----------+--------------+  POP       Full            No        Yes                                     +---------+---------------+---------+-----------+----------+--------------+  PTV       Full                                                             +---------+---------------+---------+-----------+----------+--------------+  PERO      Full                                                             +---------+---------------+---------+-----------+----------+--------------+  Summary: Right: There is no evidence of deep vein thrombosis in the lower extremity. However, portions of this examination were limited- see technologist comments above. A cystic structure is found in the popliteal fossa. Left: There is no evidence of deep vein thrombosis in the lower extremity. No cystic structure found in the popliteal fossa.  Pulsatile lower extremity venous flow is suggestive of possible elevated right heart pressure. *See table(s) above for measurements and observations. Electronically signed by Monica Martinez MD on 08/26/2019 at 1:26:25 PM.    Final    Vas [place] Transcranial Doppler  Result Date: 08/28/2019  Transcranial Doppler Indications: Stroke. Limitations for diagnostic windows: Unable to insonate right transtemporal window. Unable to insonate left transtemporal window. Comparison Study: No prior. Performing Technologist: Oda Cogan RDMS, RVT  Examination Guidelines: A complete evaluation includes B-mode imaging, spectral Doppler, color Doppler, and power Doppler as needed of all accessible portions of each vessel. Bilateral testing is considered an integral part of a complete examination. Limited examinations for reoccurring indications may be performed as noted.  +----------+-------------+----------+-----------+-------+  RIGHT TCD  Right VM (cm) Depth (cm) Pulsatility Comment  +----------+-------------+----------+-----------+-------+  Opthalmic      19.00                                     +----------+-------------+----------+-----------+-------+  ICA siphon     34.00                                      +----------+-------------+----------+-----------+-------+  Vertebral     -22.00                                     +----------+-------------+----------+-----------+-------+  +----------+------------+----------+-----------+-------+  LEFT TCD   Left VM (cm) Depth (cm) Pulsatility Comment  +----------+------------+----------+-----------+-------+  Opthalmic     24.00                                     +----------+------------+----------+-----------+-------+  ICA siphon    63.00                                     +----------+------------+----------+-----------+-------+  Vertebral     -21.00                                    +----------+------------+----------+-----------+-------+  +------------+-------+------------+               VM cm/s   Comment     +------------+-------+------------+  Prox Basilar         no insonated  +------------+-------+------------+ Summary:  Absent bitemporal and ppor suboccipital window limits evaluation. Normal blood flow directions in both opthalmics,carotid siphons and vertebral arteries. *See table(s) above for measurements and observations.  Diagnosing physician: Antony Contras MD Electronically signed by Antony Contras MD on 08/28/2019 at 3:13:12 PM.    Final    [place] Ekg Site Rite  Result Date: 09/02/2019 If Site Rite image not attached, placement could not be confirmed due to current cardiac rhythm.  Ct Maxillofacial Wo Contrast  Result  Date: 08/07/2019 CLINICAL DATA:  Tripped and fell.  Landed on face and head. EXAM: CT HEAD WITHOUT CONTRAST CT MAXILLOFACIAL WITHOUT CONTRAST CT CERVICAL SPINE WITHOUT CONTRAST TECHNIQUE: Multidetector CT imaging of the head, cervical spine, and maxillofacial structures were performed using the standard protocol without intravenous contrast. Multiplanar CT image reconstructions of the cervical spine and maxillofacial structures were also generated. COMPARISON:  Head CT 08/07/2019, CTA neck 07/20/2019 FINDINGS: CT HEAD FINDINGS Brain: No acute  intracranial hemorrhage. No focal mass lesion. No CT evidence of acute infarction. No midline shift or mass effect. No hydrocephalus. Basilar cisterns are patent. There are periventricular and subcortical white matter hypodensities. Generalized cortical atrophy. Small remote lacunar infarctions in the head of the LEFT caudate nucleus and the RIGHT lentiform nucleus. Vascular: No hyperdense vessel or unexpected calcification. Skull: Normal. Negative for fracture or focal lesion. Sinuses/Orbits: Paranasal sinuses and mastoid air cells are clear. Orbits are clear. Other: Small scalp hematoma over the midline frontal bone. CT MAXILLOFACIAL FINDINGS Osseous: Orbital rims are intact. No fracture of the maxillary sinus walls. Pterygoid plates are normal. Mildly depressed fracture of the LEFT nasal bone (image 30/2). Mild flattening of the normal contour. Mandibular condyles are located.  No mandibular fracture contour. Orbits: Globes are intact. No proptosis. Intraconal contents are normal. Sinuses: Clear Soft tissues: Unremarkable CT CERVICAL SPINE FINDINGS Motion degradation most noted on the sagittal imaging. Alignment: Normal alignment of the cervical vertebral bodies. Skull base and vertebrae: Normal craniocervical junction. No loss of vertebral body height or disc height. Normal facet articulation. No evidence of fracture. Soft tissues and spinal canal: No prevertebral soft tissue swelling. No perispinal or epidural hematoma. Disc levels: Multiple levels of endplate spurring mL joint space narrowing. No acute findings Upper chest: Bilateral layering pleural effusions. Other: Large LEFT thyroid goiter measuring up to 4.5 cm not changed from comparison CTA. IMPRESSION: 1. No acute intracranial findings. Small scalp hematoma over the midline frontal bone. 2. Atrophy and white matter microvascular disease. Remote basal ganglia lacunar infarctions. 3. Mildly depressed LEFT NASAL BONE FRACTURE. 4. No additional facial bone  fracture. 5. No acute cervical spine fracture. Mild to moderate motion degradation. 6. Bilateral pleural effusions.  Consider chest radiograph. Electronically Signed   By: Suzy Bouchard M.D.   On: 08/07/2019 11:28    PERFORMANCE STATUS (ECOG) : 4 - Bedbound  Review of Systems Unless otherwise noted, a complete review of systems is negative.  Physical Exam General: NAD, frail appearing, thin Cardiovascular: Tachycardic Pulmonary: Unlabored on ventilator, multiple chest tubes Extremities: no edema, no joint deformities Skin: no rashes Neurological: She is sedated  IMPRESSION: Patient is sedated on vent.  She had a third chest tube placed last night due to developing PTX.  I met today with patient's daughters, Elgie Congo and Langley Gauss, both of whom are patients HC POA's.  Together, we reviewed patient's recent hospitalization and current medical problems.  They recognize that patient is critically ill and that her prognosis is guarded.  However, they remain optimistic regarding her chance of clinical improvement in the coming days.  We discussed future decision making including the possibility of tracheostomy or feeding tube if required for long-term ventilator support.  We also discussed the possibility of future decline and transition to less aggressive care.  Both daughters say that they "are not wearing rose-colored glasses" and seem to understand the acuity of her illness.  Both remain in agreement with the current scope of treatment.  They say that patient was able to interact with  him yesterday on the ventilator.  They are optimistic that the patient may be able to participate in decision-making going forward.  However, I cautioned them on relying on patient fully to make complex decisions without being able to adequately assess her comprehension.  Both daughters verbalized frustration with conflicting communication received in the past from the various clinical specialties involved in  patient's care.    Case discussed with nursing and cardiology.  PLAN: -Continue current scope of treatment -We we will continue to follow and engage family with conversations regarding goals   Time Total: 60 minutes  Visit consisted of counseling and education dealing with the complex and emotionally intense issues of symptom management and palliative care in the setting of serious and potentially life-threatening illness.Greater than 50%  of this time was spent counseling and coordinating care related to the above assessment and plan.  Signed by: Altha Harm, PhD, NP-C

## 2019-09-03 NOTE — Progress Notes (Signed)
Patient ID: Lacey Coleman, female   DOB: October 02, 1931, 85 y.o.   MRN: [id number]    Advanced Heart Failure Rounding Note   Subjective:    Remains on vent. She is off Lasix today.   Now s/p bilateral chest tube placement on 10/30 with good drainage.  No pleural fluid protein or albumen sent, but pleural effusion LDH is low.  Reviewed with CCM, probably lymphocyte-predominant exudative effusion but incomplete data.   She developed a large PTX on right, found last night and she now has another chest tube on the right.  She became hypotensive overnight and norepinephrine started, now on 5.  MAP stable at this time.  PICC placed but CVP was not set up overnight.   CT chest with peripheral patchy infiltrates and large bilateral pleural effusions with compressive atx. ESR 56. Amiodarone stopped. She is on prednisone and colchicine.   Objective:   Weight Range:  Vital Signs:   Temp:  [97.6 F (36.4 C)-99 F (37.2 C)] 98 F (36.7 C) (11/01 0832) Pulse Rate:  [80-113] 100 (11/01 0600) Resp:  [16-30] 22 (11/01 0600) BP: (49-144)/(40-86) 144/75 (11/01 0600) SpO2:  [70 %-100 %] 100 % (11/01 0741) FiO2 (%):  [50 %-100 %] 50 % (11/01 0741) Weight:  [69 kg] 69 kg (11/01 0500) Last BM Date: 09/02/19  Weight change: Filed Weights   09/01/19 0500 09/02/19 0500 09/03/19 0500  Weight: 69 kg 68 kg 69 kg    Intake/Output:   Intake/Output Summary (Last 24 hours) at 09/03/2019 1036 Last data filed at 09/03/2019 0600 Gross per 24 hour  Intake 1383.63 ml  Output 1380 ml  Net 3.63 ml     Physical Exam: General: Intubated/sedated. Neck: JVP difficult, no thyromegaly or thyroid nodule.  Lungs: Decreased BS at bases.  CV: Nondisplaced PMI.  Heart mildly tachy, regular S1/S2, no S3/S4, no murmur.  No peripheral edema.   Abdomen: Soft, nontender, no hepatosplenomegaly, no distention.  Skin: Intact without lesions or rashes.  Neurologic: Sedated on vent Extremities: No clubbing or cyanosis.  HEENT:  Normal.    Telemetry: AFL 100s Personally reviewed   Labs: Basic Metabolic Panel: Recent Labs  Lab 08/30/19 0320 08/31/19 0251 09/01/19 0326 09/02/19 0356 09/03/19 0648  NA 134* 135 132* 136 136  K 3.8 4.1 3.8 3.5 3.6  CL 100 99 91* 98 99  CO2 _0 GLUCOSE 144* 149* 98 120* 128*  BUN 43* 62* 76* 80* 91*  CREATININE 0.75 0.99 1.14* 1.12* 1.18*  CALCIUM 9.9 10.0 9.7 9.4 9.8    Liver Function Tests: No results for input(s): AST, ALT, ALKPHOS, BILITOT, PROT, ALBUMIN in the last 168 hours. No results for input(s): LIPASE, AMYLASE in the last 168 hours. No results for input(s): AMMONIA in the last 168 hours.  CBC: Recent Labs  Lab 08/28/19 0830 08/30/19 0320 08/31/19 0251 09/01/19 0326 09/02/19 0356 09/03/19 0648  WBC 12.1* 10.1 12.5* 11.5* 10.3 16.2*  NEUTROABS 8.0*  --   --   --   --   --   HGB 9.1* 9.2* 8.6* 8.3* 8.7* 9.5*  HCT 31.0* 32.1* 28.1* 26.8* 28.8* 32.1*  MCV 75.6* 76.4* 72.2* 70.3* 70.4* 72.3*  PLT 320 303 295 300 414* 473*    Cardiac Enzymes: No results for input(s): CKTOTAL, CKMB, CKMBINDEX, TROPONINI in the last 168 hours.  BNP: BNP (last 3 results) Recent Labs    07/16/19 0141 08/26/19 0441 08/31/19 0251  BNP 2,027.0* 497.7* 275.5*    ProBNP (  last 3 results) No results for input(s): PROBNP in the last 8760 hours.    Other results:  Imaging: Dg Chest 1 View  Result Date: 09/01/2019 CLINICAL DATA:  Altered mental status.  Right pleural effusion. EXAM: CHEST  1 VIEW COMPARISON:  August 31, 2019. FINDINGS: Stable cardiomediastinal silhouette. Endotracheal and nasogastric tubes are unchanged in position. Status post coronary bypass graft. Bilateral chest tubes are noted without pneumothorax. Mild bibasilar subsegmental atelectasis is noted with small pleural effusions. Bony thorax is unremarkable. IMPRESSION: Stable support apparatus. Interval placement of bilateral chest tubes without pneumothorax. Bibasilar opacities noted on  prior exam are significantly improved. Electronically Signed   By: Marijo Conception M.D.   On: 09/01/2019 12:04   Dg Chest Port 1 View  Result Date: 09/03/2019 CLINICAL DATA:  Mechanically assisted ventilation. EXAM: PORTABLE CHEST 1 VIEW COMPARISON:  09/01/2019 and subsequent chest radiograph obtained today. FINDINGS: Interval proximally 65% right pneumothorax without mediastinal shift. Interval less than 5% left apical pneumothorax. Interval loculated normal thoraces or air in dilated, tortuous esophagus and gastric fundus in the left lower lung zone, described in the subsequent chest radiograph report. Right PICC tip in the mid superior vena cava. Stable post CABG changes, normal sized heart. Endotracheal tube tip 3.3 cm above the carina. This could be retracted 3.2 cm. The lung volumes remain hyperexpanded with mildly prominent interstitial markings and minimal atelectasis on the left. There is also minimal patchy opacity in the left mid and upper lung zones, not previously present. IMPRESSION: 1. Interval approximately 65% right pneumothorax without mediastinal shift. This was seen clinically with a subsequent 2nd right chest tube placed. 2. Interval less than 5% left apical pneumothorax. 3. Interval loculated normal thoraces or air in dilated esophagus and gastric fundus in the left lower lung zone. 4. Endotracheal tube tip 3.3 cm above the carina. This could be retracted 3.2 cm. 5. COPD. 6. Mild left basilar atelectasis and minimal patchy opacity in the left mid and upper lung zones, possibly representing developing pneumonia. This is not seen on the subsequent portable Electronically Signed   By: Claudie Revering M.D.   On: 09/03/2019 07:29   Dg Chest Port 1 View  Result Date: 09/03/2019 CLINICAL DATA:  Follow-up bilateral pneumothoraces EXAM: PORTABLE CHEST 1 VIEW COMPARISON:  Earlier today. Chest CT dated 08/31/2019. FINDINGS: Endotracheal tube tip 2.4 cm above the carina. This could be retracted 3.8 cm  for better positioning. Less than 5% left apical pneumothorax with no significant change. Less than 5% right apical pneumothorax, significantly decreased in size. Bilateral chest tubes remain in place. A 2nd small caliber chest tube has been placed on the right. An elongated oval collection of air is again demonstrated medially in the left mid and lower lung zone. This may represent air within a dilated esophagus, previously shown to extend into this region on the CT. Loculated pleural air is also possible. A similar area is again demonstrated at the left lung base, possibly representing loculated pleural air or gas in the gastric fundus. Stable normal sized heart, tortuous calcified thoracic aorta and post CABG changes. The lungs remain hyperexpanded. A small left pleural effusion is noted. Right PICC tip in the superior vena cava. Diffuse osteopenia. IMPRESSION: 1. Less than 5% right apical pneumothorax, significantly decreased in size. 2. Stable less than 5% left apical pneumothorax. 3. Interval small caliber right chest tube in addition to previously demonstrated bilateral chest tubes. 4. Stable oval collections of air in the left lower hemithorax, possibly representing  intraluminal air or loculated pneumothoraces, as described above. 5. Stable changes of COPD. Electronically Signed   By: Claudie Revering M.D.   On: 09/03/2019 07:24   [place] Ekg Site Rite  Result Date: 09/02/2019 If Site Rite image not attached, placement could not be confirmed due to current cardiac rhythm.    Medications:     Scheduled Medications:  aspirin  81 mg Per Tube Daily   atorvastatin  40 mg Oral q1800   chlorhexidine gluconate (MEDLINE KIT)  15 mL Mouth Rinse BID   Chlorhexidine Gluconate Cloth  6 each Topical Daily   colchicine  0.6 mg Oral BID   famotidine  20 mg Per Tube Daily   insulin aspart  0-9 Units Subcutaneous Q4H   mouth rinse  15 mL Mouth Rinse 10 times per day   predniSONE  35 mg Per Tube Q  breakfast   Followed by   Derrill Memo ON 09/15/2019] predniSONE  30 mg Per Tube Q breakfast   Followed by   Derrill Memo ON 09/22/2019] predniSONE  25 mg Per Tube Q breakfast   Followed by   Derrill Memo ON 09/29/2019] predniSONE  20 mg Per Tube Q breakfast   Followed by   Derrill Memo ON 10/06/2019] predniSONE  15 mg Per Tube Q breakfast   Followed by   Derrill Memo ON 10/20/2019] predniSONE  10 mg Per Tube Q breakfast   Followed by   Derrill Memo ON 11/03/2019] predniSONE  5 mg Per Tube Q breakfast   sodium chloride flush  10-40 mL Intracatheter Q12H    Infusions:  feeding supplement (VITAL AF 1.2 CAL) 45 mL/hr at 09/03/19 0600   fentaNYL infusion INTRAVENOUS 100 mcg/hr (09/03/19 0600)   heparin 1,050 Units/hr (09/03/19 0909)   norepinephrine (LEVOPHED) Adult infusion 5 mcg/min (09/03/19 0600)    PRN Medications: acetaminophen, fentaNYL (SUBLIMAZE) injection, ipratropium-albuterol, midazolam, ondansetron (ZOFRAN) IV, sodium chloride flush   Assessment/Plan:    1. Acute on chronic diastolic HF - Echo EF 16-10% - Chest tubes now present bilaterally with good drainage of pleural effusions.  - Volume status very difficult discern by exam.  PICC line placed, will set up CVP this morning.  She did not get Lasix yesterday.   2. Acute Hypoxic Respiratory Failure - Currently seems mainly due to bilateral large pleural effusions.  Now with bilateral chest tubes.  Fluid not fully analyzed, ?lymphocyte predominant exudate.  Effusions would be unusual as an amiodarone complication.  She was noted to have a sizable PTX on right last night, got another chest tube on right.  - CT with small peripheral areas of infiltrate both upper lobes.  ESR 56, not markedly high.  No definite diagnosis but amiodarone has been stopped due to concern for possible toxicity and she is on prednisone and colchicine.  - No antibiotics, afebrile   - ?Post-pericardiotomy syndrome => continue prednisone and colchicine.   3. Paroxysmal  AFL with RVR - new. Unclear duration. Suspect ~ 1 week - Stopped amiodarone given possible toxicity.  - HR 100s, tolerating norepinephrine.    - Will need eventual TEE-guided DCCV.  She is on heparin gtt.  Can make decision on timing in the morning, can be done at bedside.    4. Pleural effusions - see discussion above  5. CAD s/p CABG - no s/s ischemia. Continue ASA/statin  6. Subacute CVA - Neuro has seen  7. Aortic insufficiency - Mild to moderate on echo. - PHT 350 msec so probably at least moderate . Will reassess with TEE  8. Left neck mass - Noted on CT chest.  Probably multinodular goiter.   9. PTX - On right, now has additional right chest tube.   Discussed with Dr. Valeta Harms.    CRITICAL CARE Performed by: Loralie Champagne  Total critical care time: 35 minutes  Critical care time was exclusive of separately billable procedures and treating other patients.  Critical care was necessary to treat or prevent imminent or life-threatening deterioration.  Critical care was time spent personally by me (independent of midlevel providers or residents) on the following activities: development of treatment plan with patient and/or surrogate as well as nursing, discussions with consultants, evaluation of patient's response to treatment, examination of patient, obtaining history from patient or surrogate, ordering and performing treatments and interventions, ordering and review of laboratory studies, ordering and review of radiographic studies, pulse oximetry and re-evaluation of patient's condition.    Length of Stay: 8   Loralie Champagne MD 09/03/2019, 10:36 AM  Advanced Heart Failure Team Pager [phone] (M-F; 7a - 4p)  Please contact [facility] for night-coverage after hours (4p -7a ) and weekends on [email or web address]

## 2019-09-03 NOTE — Progress Notes (Signed)
[address]       [place] [address]             [phone]         Subjective: More awake, trying to sit up a little  Objective: Vital signs in last 24 hours: Temp:  [97.6 F (36.4 C)-99 F (37.2 C)] 98 F (36.7 C) (11/01 0832) Pulse Rate:  [80-113] 100 (11/01 0600) Cardiac Rhythm: (P) Atrial flutter (11/01 0400) Resp:  [16-30] 22 (11/01 0600) BP: (49-144)/(40-86) 144/75 (11/01 0600) SpO2:  [70 %-100 %] 100 % (11/01 0741) FiO2 (%):  [50 %-100 %] 50 % (11/01 0741) Weight:  [69 kg] 69 kg (11/01 0500)  Hemodynamic parameters for last 24 hours:    Intake/Output from previous day: 10/31 0701 - 11/01 0700 In: 1565.6 [I.V.:935.6; NG/GT:630] Out: 1530 [Urine:750; Chest Tube:780] Intake/Output this shift: No intake/output data recorded.  General appearance: no distress and uncooperative Heart: irregularly irregular rhythm Lungs: fairly clear anteriorly Abdomen: soft, nontender  Lab Results: Recent Labs    09/02/19 0356 09/03/19 0648  WBC 10.3 16.2*  HGB 8.7* 9.5*  HCT 28.8* 32.1*  PLT 414* 473*   BMET:  Recent Labs    09/02/19 0356 09/03/19 0648  NA 136 136  K 3.5 3.6  CL 98 99  CO2 27 24  GLUCOSE 120* 128*  BUN 80* 91*  CREATININE 1.12* 1.18*  CALCIUM 9.4 9.8    PT/INR: No results for input(s): LABPROT, INR in the last 72 hours. ABG    Component Value Date/Time   PHART 7.443 08/26/2019 1305   HCO3 20.2 08/26/2019 1305   TCO2 21 (L) 08/26/2019 1305   ACIDBASEDEF 3.0 (H) 08/26/2019 1305   O2SAT 100.0 08/26/2019 1305   CBG (last 3)  Recent Labs    09/03/19 0027 09/03/19 0425 09/03/19 0857  GLUCAP 139* 132* 122*    Meds Scheduled Meds:  aspirin  81 mg Per Tube Daily   atorvastatin  40 mg Oral q1800   chlorhexidine gluconate (MEDLINE KIT)  15 mL Mouth Rinse BID   Chlorhexidine Gluconate Cloth  6 each Topical Daily   colchicine  0.6 mg Oral BID   famotidine  20 mg Per Tube Daily   insulin aspart  0-9 Units  Subcutaneous Q4H   mouth rinse  15 mL Mouth Rinse 10 times per day   predniSONE  35 mg Per Tube Q breakfast   Followed by   Derrill Memo ON 09/15/2019] predniSONE  30 mg Per Tube Q breakfast   Followed by   Derrill Memo ON 09/22/2019] predniSONE  25 mg Per Tube Q breakfast   Followed by   Derrill Memo ON 09/29/2019] predniSONE  20 mg Per Tube Q breakfast   Followed by   Derrill Memo ON 10/06/2019] predniSONE  15 mg Per Tube Q breakfast   Followed by   Derrill Memo ON 10/20/2019] predniSONE  10 mg Per Tube Q breakfast   Followed by   Derrill Memo ON 11/03/2019] predniSONE  5 mg Per Tube Q breakfast   sodium chloride flush  10-40 mL Intracatheter Q12H   Continuous Infusions:  feeding supplement (VITAL AF 1.2 CAL) 45 mL/hr at 09/03/19 0600   fentaNYL infusion INTRAVENOUS 100 mcg/hr (09/03/19 0600)   heparin 1,050 Units/hr (09/03/19 0909)   norepinephrine (LEVOPHED) Adult infusion 5 mcg/min (09/03/19 0600)   PRN Meds:.acetaminophen, fentaNYL (SUBLIMAZE) injection, ipratropium-albuterol, midazolam, ondansetron (ZOFRAN) IV, sodium chloride flush  Xrays Dg Chest 1 View  Result Date: 09/01/2019 CLINICAL DATA:  Altered mental status.  Right pleural effusion. EXAM: CHEST  1 VIEW COMPARISON:  August 31, 2019. FINDINGS: Stable cardiomediastinal silhouette. Endotracheal and nasogastric tubes are unchanged in position. Status post coronary bypass graft. Bilateral chest tubes are noted without pneumothorax. Mild bibasilar subsegmental atelectasis is noted with small pleural effusions. Bony thorax is unremarkable. IMPRESSION: Stable support apparatus. Interval placement of bilateral chest tubes without pneumothorax. Bibasilar opacities noted on prior exam are significantly improved. Electronically Signed   By: Marijo Conception M.D.   On: 09/01/2019 12:04   Dg Chest Port 1 View  Result Date: 09/03/2019 CLINICAL DATA:  Mechanically assisted ventilation. EXAM: PORTABLE CHEST 1 VIEW COMPARISON:  09/01/2019 and subsequent chest  radiograph obtained today. FINDINGS: Interval proximally 65% right pneumothorax without mediastinal shift. Interval less than 5% left apical pneumothorax. Interval loculated normal thoraces or air in dilated, tortuous esophagus and gastric fundus in the left lower lung zone, described in the subsequent chest radiograph report. Right PICC tip in the mid superior vena cava. Stable post CABG changes, normal sized heart. Endotracheal tube tip 3.3 cm above the carina. This could be retracted 3.2 cm. The lung volumes remain hyperexpanded with mildly prominent interstitial markings and minimal atelectasis on the left. There is also minimal patchy opacity in the left mid and upper lung zones, not previously present. IMPRESSION: 1. Interval approximately 65% right pneumothorax without mediastinal shift. This was seen clinically with a subsequent 2nd right chest tube placed. 2. Interval less than 5% left apical pneumothorax. 3. Interval loculated normal thoraces or air in dilated esophagus and gastric fundus in the left lower lung zone. 4. Endotracheal tube tip 3.3 cm above the carina. This could be retracted 3.2 cm. 5. COPD. 6. Mild left basilar atelectasis and minimal patchy opacity in the left mid and upper lung zones, possibly representing developing pneumonia. This is not seen on the subsequent portable Electronically Signed   By: Claudie Revering M.D.   On: 09/03/2019 07:29   Dg Chest Port 1 View  Result Date: 09/03/2019 CLINICAL DATA:  Follow-up bilateral pneumothoraces EXAM: PORTABLE CHEST 1 VIEW COMPARISON:  Earlier today. Chest CT dated 08/31/2019. FINDINGS: Endotracheal tube tip 2.4 cm above the carina. This could be retracted 3.8 cm for better positioning. Less than 5% left apical pneumothorax with no significant change. Less than 5% right apical pneumothorax, significantly decreased in size. Bilateral chest tubes remain in place. A 2nd small caliber chest tube has been placed on the right. An elongated oval  collection of air is again demonstrated medially in the left mid and lower lung zone. This may represent air within a dilated esophagus, previously shown to extend into this region on the CT. Loculated pleural air is also possible. A similar area is again demonstrated at the left lung base, possibly representing loculated pleural air or gas in the gastric fundus. Stable normal sized heart, tortuous calcified thoracic aorta and post CABG changes. The lungs remain hyperexpanded. A small left pleural effusion is noted. Right PICC tip in the superior vena cava. Diffuse osteopenia. IMPRESSION: 1. Less than 5% right apical pneumothorax, significantly decreased in size. 2. Stable less than 5% left apical pneumothorax. 3. Interval small caliber right chest tube in addition to previously demonstrated bilateral chest tubes. 4. Stable oval collections of air in the left lower hemithorax, possibly representing intraluminal air or loculated pneumothoraces, as described above. 5. Stable changes of COPD. Electronically Signed   By: Claudie Revering M.D.   On: 09/03/2019 07:24   [place] Ekg  Site Rite  Result Date: 09/02/2019 If Site Rite image not attached, placement could not be confirmed due to current cardiac rhythm.   Assessment/Plan:  1 right chest developed a pneumothorax, now status post new pigtail catheter placed per PCCM- about 5% pntx currently  2 CT drainage remains significant- cont drains   3 primary medical management per CCM and AHF team  LOS: 8 days    John Giovanni [facility] 09/03/2019 Pager [phone]- not for patient use

## 2019-09-03 NOTE — Progress Notes (Signed)
NAME:  Lacey Coleman, MRN:  [id number], DOB:  February 05, 1931, LOS: 8 ADMISSION DATE:  09-18-19, CONSULTATION DATE: 08/26/2019 REFERRING MD: Dr. Elesa Massed, CHIEF COMPLAINT: Altered mental status  Brief History   85 year old lady found unresponsive at home, upon arrival by EMS, intubated.  History of present illness    85 yo F PMH CAD s/p CABG in 08/2019, CHF, HTN, RA, A fib (eliquis dc 1 week ago) who presents 10/23 with AMS. Patient was found unresponsive at home in chair, found by family. Upon EMS arrival, SpO2 30%.  EMS reported ability to follow commands en route after being placed on NRB; EMS reports degree of aphagia. In ED, patient not following commands, and is subsequently intubated due to hypoxia. Concern for CVA due to AMS; CT  H obtained and is concerning for acute vs subacute infarct. Neurology informally consulted by EDP and will see if MRI brain reveals acute infarct. Patient determined not to be candidate for tPA.   Per patient's daughter, patient experienced a fall during hospitalization approx 1 month ago and has been weak since discharge home.   Lactic acid 2.5, WBC 19.8, ABG 7.28/51/133. BMP pendng  COVID-19 neg Started of vanc and cefepime in ED   Past Medical History   Past Medical History:  Diagnosis Date  . CAD (coronary artery disease)    9/14 LHC with EF 25-35%, mod elevated LVEDP, dLM 90%, pLCx80%, pLAD to mLAD 90%, 1st diag 80%, mid Cx 100%, pRCA 90%, pRCA to mRCA 100%  . Heart failure with reduced ejection fraction (HCC)    a) 9/13 echo with EF 30-35%, mild LVF, diffuse hypokinesis, mod aortic calcification, mild AR  . Hypertension   . Rheumatoid arthritis (HCC)     Significant Hospital Events   Altered mental status Intubated for airway protection CT reveals acute versus subacute infarct MRI did reveal a stroke-subacute  Consults:  Neurology Cardiology.  Procedures:  10/24 endotracheal tube>>  Significant Diagnostic Tests:  10/24 CXR> bilateral  pleural effusions, bibasilar atelectasis vs infiltrate   10/24 CT H non con> 1. No acute intracranial hemorrhage. 2. Mild age-related atrophy and chronic microvascular ischemic changes. 3. Bilateral frontal white matter hypodense lesions, new since theprior CT of 08/07/2019 and concerning for areas of subacute or acute infarct. Clinical correlation is recommended. MRI may provide betterevaluation if clinically indicated.  10/24 MRI brain IMPRESSION: 1. 18 mm focus of diffusion abnormality involving the subcortical left frontal lobe, most consistent with evolving late subacute ischemic infarct. No associated hemorrhage or mass effect. 2. No other acute intracranial abnormality. 3. Underlying atrophy with mild chronic microvascular ischemic disease.  Micro Data:  10/24 SARS CoV2> neg   Antimicrobials:  10/24 vanc>  10/24 cefepime>   Interim history/subjective:   Intubated on mechanical ventilation critically ill.  Objective   Blood pressure (!) 144/75, pulse 100, temperature 99 F (37.2 C), temperature source Axillary, resp. rate (!) 22, height 5\' 4"  (1.626 m), weight 69 kg, SpO2 100 %.    Vent Mode: PRVC FiO2 (%):  [50 %-100 %] 50 % Set Rate:  [18 bmp] 18 bmp Vt Set:  [440 mL] 440 mL PEEP:  [8 cmH20] 8 cmH20 Plateau Pressure:  [18 cmH20-22 cmH20] 21 cmH20   Intake/Output Summary (Last 24 hours) at 09/03/2019 0921 Last data filed at 09/03/2019 0600 Gross per 24 hour  Intake 1422.22 ml  Output 1455 ml  Net -32.78 ml   Filed Weights   09/01/19 0500 09/02/19 0500 09/03/19 0500  Weight: 69 kg  68 kg 69 kg    Examination: General: Frail elderly female intubated on mechanical life support HENT: Endotracheal tube in place, NCAT Lungs: Patient has bilateral ventilated breath sounds Cardiovascular: T regular rate and rhythm S1-S2 no MRG Abdomen: Soft, nontender, nondistended, bowel sounds present Extremities: No significant edema Neuro: Sedated on mechanical ventilation  response to pain.   Resolved Hospital Problem list    Assessment & Plan:   Acute hypoxemic respiratory failure requiring intubation mechanical ventilation. Bilateral pleural effusions drained status post bilateral chest tubes. Presumed related to heart failure, valvular disease discussed with cardiothoracic surgery.  Patient had a high prior to having her CABG. CT imaging with patchy infiltrates there was some concern for potential amiodarone induced lung toxicity based on imaging report Right-sided chest tube failed with suction yesterday, developed right-sided pneumothorax and a additional pigtail was placed overnight. -PICC line placed yesterday per heart failure team -Peripheral regimen per cardiology -Beta-blocker had to be held yesterday as patient was hypotensive.  Acute metabolic encephalopathy CVA on MRI appear subacute -Holding prior Seroquel dosing -Remains on heparin drip.  Heart failure with preserved. ejection fraction, aortic insufficiency -Plan TEE -For heart failure service  Bilateral large effusions Patient has underwent a thoracentesis a few days ago on one of the effusions which revealed inflammatory markers however not all data was collected from this effusion to be able to help determine its etiology. It does have an elevated white blood cell count and it 77% lymphocyte predominant.  The LDH is only mildly elevated but the suspicion is that it is related to a chronic or post inflammatory effusion due to her recent surgery. -Bilateral chest tube placed by cardiothoracic surgery -Chest tube management per surgery -Continue steroids and colchicine that was started last week.  Atrial fibrillation with rapid ventricular response.  Rate controlled Loss of atrial kick contributing to pulmonary edema in context of pre-existing diastolic dysfunction.  -Amiodarone was stopped due to concern for possible amiodarone induced lung toxicity. -Continue heparin drip   Asymptomatic bactiuria. E coli present but urinalysis negative. -Antibiotics stopped  Goals of care: Consult placed to palliative care.  Best practice:  Diet: Continue tube feeds Pain/Anxiety/Delirium protocol (if indicated): Fentanyl, as needed fentanyl VAP protocol (if indicated): In place DVT prophylaxis: Heparin IV  GI prophylaxis: Protonix Glucose control: Insulin SSC Mobility: Bedrest Code Status: Partial code Family Communication: Discussed with daughter at bedside 10/29 Disposition: ICU  Labs   CBC: Recent Labs  Lab 08/28/19 0830 08/30/19 0320 08/31/19 0251 09/01/19 0326 09/02/19 0356 09/03/19 0648  WBC 12.1* 10.1 12.5* 11.5* 10.3 16.2*  NEUTROABS 8.0*  --   --   --   --   --   HGB 9.1* 9.2* 8.6* 8.3* 8.7* 9.5*  HCT 31.0* 32.1* 28.1* 26.8* 28.8* 32.1*  MCV 75.6* 76.4* 72.2* 70.3* 70.4* 72.3*  PLT 320 303 295 300 414* 473*    Basic Metabolic Panel: Recent Labs  Lab 08/30/19 0320 08/31/19 0251 09/01/19 0326 09/02/19 0356 09/03/19 0648  NA 134* 135 132* 136 136  K 3.8 4.1 3.8 3.5 3.6  CL 100 99 91* 98 99  CO2 27 23 25 27 24   GLUCOSE 144* 149* 98 120* 128*  BUN 43* 62* 76* 80* 91*  CREATININE 0.75 0.99 1.14* 1.12* 1.18*  CALCIUM 9.9 10.0 9.7 9.4 9.8   GFR: Estimated Creatinine Clearance: 32.6 mL/min (A) (by C-G formula based on SCr of 1.18 mg/dL (H)). Recent Labs  Lab 08/31/19 0251 09/01/19 0326 09/02/19 0356 09/03/19 [id number]  WBC 12.5* 11.5* 10.3 16.2*    Liver Function Tests: No results for input(s): AST, ALT, ALKPHOS, BILITOT, PROT, ALBUMIN in the last 168 hours. No results for input(s): LIPASE, AMYLASE in the last 168 hours. No results for input(s): AMMONIA in the last 168 hours.  ABG    Component Value Date/Time   PHART 7.443 08/26/2019 1305   PCO2ART 29.5 (L) 08/26/2019 1305   PO2ART 213.0 (H) 08/26/2019 1305   HCO3 20.2 08/26/2019 1305   TCO2 21 (L) 08/26/2019 1305   ACIDBASEDEF 3.0 (H) 08/26/2019 1305   O2SAT 100.0 08/26/2019  1305    This patient is critically ill with multiple organ system failure; which, requires frequent high complexity decision making, assessment, support, evaluation, and titration of therapies. This was completed through the application of advanced monitoring technologies and extensive interpretation of multiple databases. During this encounter critical care time was devoted to patient care services described in this note for 34 minutes.    Garner Nash, DO [facility] 09/03/2019 9:21 AM

## 2019-09-03 DEATH — deceased

## 2019-09-04 ENCOUNTER — Inpatient Hospital Stay (HOSPITAL_COMMUNITY): Payer: Medicare Other

## 2019-09-04 DIAGNOSIS — I970 Postcardiotomy syndrome: Secondary | ICD-10-CM

## 2019-09-04 LAB — HEPARIN LEVEL (UNFRACTIONATED): Heparin Unfractionated: 0.44 IU/mL (ref 0.30–0.70)

## 2019-09-04 LAB — CBC
HCT: 28.7 % — ABNORMAL LOW (ref 36.0–46.0)
Hemoglobin: 8.5 g/dL — ABNORMAL LOW (ref 12.0–15.0)
MCH: 21.5 pg — ABNORMAL LOW (ref 26.0–34.0)
MCHC: 29.6 g/dL — ABNORMAL LOW (ref 30.0–36.0)
MCV: 72.7 fL — ABNORMAL LOW (ref 80.0–100.0)
Platelets: 412 10*3/uL — ABNORMAL HIGH (ref 150–400)
RBC: 3.95 MIL/uL (ref 3.87–5.11)
RDW: 17.5 % — ABNORMAL HIGH (ref 11.5–15.5)
WBC: 16 10*3/uL — ABNORMAL HIGH (ref 4.0–10.5)
nRBC: 0 % (ref 0.0–0.2)

## 2019-09-04 LAB — GLUCOSE, CAPILLARY
Glucose-Capillary: 103 mg/dL — ABNORMAL HIGH (ref 70–99)
Glucose-Capillary: 107 mg/dL — ABNORMAL HIGH (ref 70–99)
Glucose-Capillary: 118 mg/dL — ABNORMAL HIGH (ref 70–99)
Glucose-Capillary: 121 mg/dL — ABNORMAL HIGH (ref 70–99)
Glucose-Capillary: 126 mg/dL — ABNORMAL HIGH (ref 70–99)
Glucose-Capillary: 129 mg/dL — ABNORMAL HIGH (ref 70–99)
Glucose-Capillary: 99 mg/dL (ref 70–99)

## 2019-09-04 LAB — BASIC METABOLIC PANEL
Anion gap: 11 (ref 5–15)
BUN: 87 mg/dL — ABNORMAL HIGH (ref 8–23)
CO2: 27 mmol/L (ref 22–32)
Calcium: 9.6 mg/dL (ref 8.9–10.3)
Chloride: 100 mmol/L (ref 98–111)
Creatinine, Ser: 1.11 mg/dL — ABNORMAL HIGH (ref 0.44–1.00)
GFR calc Af Amer: 52 mL/min — ABNORMAL LOW (ref >60)
GFR calc non Af Amer: 45 mL/min — ABNORMAL LOW (ref >60)
Glucose, Bld: 110 mg/dL — ABNORMAL HIGH (ref 70–99)
Potassium: 3.4 mmol/L — ABNORMAL LOW (ref 3.5–5.1)
Sodium: 138 mmol/L (ref 135–145)

## 2019-09-04 LAB — COOXEMETRY PANEL
Carboxyhemoglobin: 2.1 % — ABNORMAL HIGH (ref 0.5–1.5)
Methemoglobin: 1.1 % (ref 0.0–1.5)
O2 Saturation: 86.4 %
Total hemoglobin: 8.3 g/dL — ABNORMAL LOW (ref 12.0–16.0)

## 2019-09-04 MED ORDER — POTASSIUM CHLORIDE 20 MEQ/15ML (10%) PO SOLN
20.0000 meq | Freq: Once | ORAL | Status: AC
  Administered 2019-09-04: 20 meq
  Filled 2019-09-04: qty 15

## 2019-09-04 MED ORDER — METOPROLOL TARTRATE 5 MG/5ML IV SOLN
5.0000 mg | Freq: Four times a day (QID) | INTRAVENOUS | Status: DC | PRN
  Administered 2019-09-04: 5 mg via INTRAVENOUS
  Filled 2019-09-04: qty 5

## 2019-09-04 MED ORDER — METOPROLOL TARTRATE 12.5 MG HALF TABLET
12.5000 mg | ORAL_TABLET | Freq: Two times a day (BID) | ORAL | Status: DC
  Administered 2019-09-04: 12.5 mg
  Filled 2019-09-04: qty 1

## 2019-09-04 MED ORDER — SODIUM CHLORIDE 0.9 % IV SOLN
INTRAVENOUS | Status: DC
  Administered 2019-09-04: 22:00:00 via INTRAVENOUS

## 2019-09-04 MED ORDER — METOPROLOL TARTRATE 12.5 MG HALF TABLET: 12.5000 mg | ORAL_TABLET | Freq: Two times a day (BID) | ORAL | Status: DC

## 2019-09-04 MED ORDER — ACETAMINOPHEN 160 MG/5ML PO SOLN
650.0000 mg | Freq: Two times a day (BID) | ORAL | Status: DC
  Administered 2019-09-04 – 2019-09-05 (×4): 650 mg
  Filled 2019-09-04 (×4): qty 20.3

## 2019-09-04 MED ORDER — MIDAZOLAM HCL 2 MG/2ML IJ SOLN
2.0000 mg | Freq: Once | INTRAMUSCULAR | Status: AC
  Administered 2019-09-04: 13:00:00 2 mg via INTRAVENOUS

## 2019-09-04 MED ORDER — DEXMEDETOMIDINE HCL IN NACL 400 MCG/100ML IV SOLN
0.0000 ug/kg/h | INTRAVENOUS | Status: DC
  Administered 2019-09-05: 0.4 ug/kg/h via INTRAVENOUS
  Administered 2019-09-05: 0.7 ug/kg/h via INTRAVENOUS
  Administered 2019-09-05: 0.4 ug/kg/h via INTRAVENOUS
  Administered 2019-09-06: 0.8 ug/kg/h via INTRAVENOUS
  Filled 2019-09-04 (×4): qty 100

## 2019-09-04 MED ORDER — MIDAZOLAM HCL 2 MG/2ML IJ SOLN
INTRAMUSCULAR | Status: AC
  Administered 2019-09-04: 2 mg via INTRAVENOUS
  Filled 2019-09-04: qty 4

## 2019-09-04 MED ORDER — FENTANYL CITRATE (PF) 100 MCG/2ML IJ SOLN: 25.0000 ug | INTRAMUSCULAR | Status: DC | PRN

## 2019-09-04 MED ORDER — NOREPINEPHRINE 4 MG/250ML-% IV SOLN
0.0000 ug/min | INTRAVENOUS | Status: DC
  Administered 2019-09-04: 2 ug/min via INTRAVENOUS

## 2019-09-04 NOTE — CV Procedure (Signed)
   TRANSESOPHAGEAL ECHOCARDIOGRAM GUIDED DIRECT CURRENT CARDIOVERSION  NAME:  Lacey Coleman   MRN: [id number] DOB:  February 15, 1931   ADMIT DATE: 09/18/19  INDICATIONS:  Atrial flutter with RVR  PROCEDURE:   Informed consent was obtained prior to the procedure. The risks, benefits and alternatives for the procedure were discussed and the patient comprehended these risks.  Risks include, but are not limited to, cough, sore throat, vomiting, nausea, somnolence, esophageal and stomach trauma or perforation, bleeding, low blood pressure, aspiration, pneumonia, infection, trauma to the teeth and death.     The patient was already intubated. She was given an additional 2mg  Versed and 178mc Fentanyl. After a procedural time-out, the transesophageal probe was inserted in the esophagus and stomach without difficulty and multiple views were obtained.   FINDINGS:  LEFT VENTRICLE: EF = 60-65% + LVH   RIGHT VENTRICLE: Small. Normal function   LEFT ATRIUM: Severely enlarged  LEFT ATRIAL APPENDAGE: No clot  RIGHT ATRIUM: Normal  AORTIC VALVE:  Trileaflet. Moderately calcified. Mild AI  MITRAL VALVE:    Calcified. Mild to moderate MR  TRICUSPID VALVE: Normal. Mild TR  PULMONIC VALVE: Grossly normal. Trivial PR.  INTERATRIAL SEPTUM: Lipomatous hypertrophy. No obvious shunt.   PERICARDIUM: No effusion.  DESCENDING AORTA: Severe plaque.   CARDIOVERSION:     Indications:  Atrial Flutter  Procedure Details:  Once the TEE was complete, the patient had the defibrillator pads placed in the anterior and posterior position. Once an appropriate level of sedation was achieved, the patient received a single biphasic, synchronized 150J shock with prompt conversion to sinus rhythm. No apparent complications.   Glori Bickers, MD  1:40 PM

## 2019-09-04 NOTE — Progress Notes (Signed)
Patient's daughter at bedside. Updated extensively on plan of care. Entire course of events reviewed from September hospitalization for CABG.  Patient was functional at home prior to surgery.  Daughter indicates the patient has a desire to continue to have therapy & hopes to return to prior functional status.  Daughter questioned plan for possible extubation and what tracheostomy might look like.  We reviewed placement of tracheostomy, process of liberation from vent, frequent need for inpatient vent care and potential change in lifestyle for patient.  Daughter hopeful for cardioversion to be successful.  All questions answered.  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  Will continue to follow.    Noe Gens, NP-C [facility] 09/04/2019, 2:55 PM

## 2019-09-04 NOTE — Progress Notes (Signed)
ANTICOAGULATION CONSULT NOTE - Follow Up Consult  Pharmacy Consult for heparin Indication: atrial fibrillation  No Known Allergies  Patient Measurements: Height: _0  (162.6 cm) Weight: 140 lb 14 oz (63.9 kg) IBW/kg (Calculated) : 54.7 Heparin Dosing Weight: 63.6  Vital Signs: Temp: 99.9 F (37.7 C) (11/02 1156) Temp Source: Bladder (11/02 0400) BP: 98/66 (11/02 1300) Pulse Rate: 118 (11/02 1300)  Labs: Recent Labs    09/02/19 0356 09/03/19 0648 09/04/19 0500  HGB 8.7* 9.5* 8.5*  HCT 28.8* 32.1* 28.7*  PLT 414* 473* 412*  HEPARINUNFRC 0.29* 0.59 0.44  CREATININE 1.12* 1.18* 1.11*    Estimated Creatinine Clearance: 31.4 mL/min (A) (by C-G formula based on SCr of 1.11 mg/dL (H)).   Medical History: Past Medical History:  Diagnosis Date  . CAD (coronary artery disease)    9/14 LHC with EF 25-35%, mod elevated LVEDP, dLM 90%, pLCx80%, pLAD to mLAD 90%, 1st diag 80%, mid Cx 100%, pRCA 90%, pRCA to mRCA 100%  . Heart failure with reduced ejection fraction ([place])    a) 9/13 echo with EF 30-35%, mild LVF, diffuse hypokinesis, mod aortic calcification, mild AR  . Hypertension   . Rheumatoid arthritis (HCC)     Medications:  Scheduled:  . acetaminophen (TYLENOL) oral liquid 160 mg/5 mL  650 mg Per Tube BID  . aspirin  81 mg Per Tube Daily  . atorvastatin  40 mg Oral q1800  . chlorhexidine gluconate (MEDLINE KIT)  15 mL Mouth Rinse BID  . Chlorhexidine Gluconate Cloth  6 each Topical Daily  . colchicine  0.6 mg Oral BID  . famotidine  20 mg Per Tube Daily  . insulin aspart  0-9 Units Subcutaneous Q4H  . mouth rinse  15 mL Mouth Rinse 10 times per day  . predniSONE  35 mg Per Tube Q breakfast   Followed by  . [START ON 09/15/2019] predniSONE  30 mg Per Tube Q breakfast   Followed by  . [START ON 09/22/2019] predniSONE  25 mg Per Tube Q breakfast   Followed by  . [START ON 09/29/2019] predniSONE  20 mg Per Tube Q breakfast   Followed by  . [START ON 10/06/2019]  predniSONE  15 mg Per Tube Q breakfast   Followed by  . [START ON 10/20/2019] predniSONE  10 mg Per Tube Q breakfast   Followed by  . [START ON 11/03/2019] predniSONE  5 mg Per Tube Q breakfast  . sodium chloride flush  10-40 mL Intracatheter Q12H   Infusions:  . sodium chloride    . dexmedetomidine (PRECEDEX) IV infusion Stopped (09/04/19 0948)  . feeding supplement (VITAL AF 1.2 CAL) 45 mL/hr at 09/03/19 0600  . fentaNYL infusion INTRAVENOUS 100 mcg/hr (09/04/19 1200)  . heparin 1,050 Units/hr (09/04/19 1200)  . norepinephrine (LEVOPHED) Adult infusion 2 mcg/min (09/04/19 1319)    Assessment: 85 yo F admitted for AMS, found unresponsive at home and intubated due to hypoxia. 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 Patient was started on apixaban for PAF, but this was stopped on 10/16 after a fall on 10/5. Pharmacy consulted to initiate IV heparin for PAF with possible cardioversion this admission. Target lower heparin goals with possible stroke.  Heparin level therapeutic for lower goal at 0.44. CBC stable. No active bleed issues reported.  Goal of Therapy:  Heparin level 0.3-0.5 units/ml, no boluses Monitor platelets by anticoagulation protocol: Yes   Plan:  Continue heparin at 1050 units/hr Monitor daily heparin level and  CBC, s/sx bleeding F/u plan for anticoagulation   Elicia Lamp, PharmD, BCPS Please check AMION for all [facility] contact numbers Clinical Pharmacist 09/04/2019 1:56 PM

## 2019-09-04 NOTE — Progress Notes (Signed)
Patient ID: Lacey Coleman, female   DOB: 09/09/1931, 85 y.o.   MRN: [id number]    Advanced Heart Failure Rounding Note   Subjective:    Now s/p bilateral chest tube placement on 10/30 with good drainage. Reviewed with CCM, probably lymphocyte-predominant exudative effusion but incomplete data.   Remains intubated. Awake on vent. Draining from both CTs. Serous fluid. Was on NE overnight after receiving Versed. Now off.   AF rates 100-120 overnight. Now 120-130. CVP 4-5/   Awake on vent following commands.   Objective:   Weight Range:  Vital Signs:   Temp:  [97.6 F (36.4 C)-98.8 F (37.1 C)] 98.6 F (37 C) (11/02 0800) Pulse Rate:  [95-133] 117 (11/02 0700) Resp:  [13-36] 18 (11/02 0700) BP: (61-182)/(44-113) 113/77 (11/02 0700) SpO2:  [90 %-100 %] 100 % (11/02 0737) FiO2 (%):  [40 %-50 %] 40 % (11/02 0737) Weight:  [63.9 kg] 63.9 kg (11/02 0500) Last BM Date: 09/02/19  Weight change: Filed Weights   09/02/19 0500 09/03/19 0500 09/04/19 0500  Weight: 68 kg 69 kg 63.9 kg    Intake/Output:   Intake/Output Summary (Last 24 hours) at 09/04/2019 0817 Last data filed at 09/04/2019 0800 Gross per 24 hour  Intake 1151.09 ml  Output 1657 ml  Net -505.91 ml     Physical Exam: General:  Awake on vent following commands HEENT: normal + ET Neck: supple. no JVD. Carotids 2+ bilat; no bruits. No lymphadenopathy or thryomegaly appreciated. Cor: PMI nondisplaced. Irregular tachy  No rubs, gallops or murmurs. Lungs: clear Abdomen: soft, nontender, nondistended. No hepatosplenomegaly. No bruits or masses. Good bowel sounds. Extremities: no cyanosis, clubbing, rash, trace-1+ edema Neuro: awake on vent   Telemetry: AFL 120-130s Personally reviewed   Labs: Basic Metabolic Panel: Recent Labs  Lab 08/31/19 0251 09/01/19 0326 09/02/19 0356 09/03/19 0648 09/04/19 0500  NA 135 132* 136 136 138  K 4.1 3.8 3.5 3.6 3.4*  CL 99 91* 98 99 100  CO2 '23 25 27 24 27  '$ GLUCOSE 149* 98  120* 128* 110*  BUN 62* 76* 80* 91* 87*  CREATININE 0.99 1.14* 1.12* 1.18* 1.11*  CALCIUM 10.0 9.7 9.4 9.8 9.6    Liver Function Tests: No results for input(s): AST, ALT, ALKPHOS, BILITOT, PROT, ALBUMIN in the last 168 hours. No results for input(s): LIPASE, AMYLASE in the last 168 hours. No results for input(s): AMMONIA in the last 168 hours.  CBC: Recent Labs  Lab 08/31/19 0251 09/01/19 0326 09/02/19 0356 09/03/19 0648 09/04/19 0500  WBC 12.5* 11.5* 10.3 16.2* 16.0*  HGB 8.6* 8.3* 8.7* 9.5* 8.5*  HCT 28.1* 26.8* 28.8* 32.1* 28.7*  MCV 72.2* 70.3* 70.4* 72.3* 72.7*  PLT 295 300 414* 473* 412*    Cardiac Enzymes: No results for input(s): CKTOTAL, CKMB, CKMBINDEX, TROPONINI in the last 168 hours.  BNP: BNP (last 3 results) Recent Labs    07/16/19 0141 08/26/19 0441 08/31/19 0251  BNP 2,027.0* 497.7* 275.5*    ProBNP (last 3 results) No results for input(s): PROBNP in the last 8760 hours.    Other results:  Imaging: Dg Chest Port 1 View  Result Date: 09/04/2019 CLINICAL DATA:  Mechanical ventilation EXAM: PORTABLE CHEST 1 VIEW COMPARISON:  September 03, 2019 FINDINGS: The endotracheal tube terminates above the carina. The right-sided PICC line is well position. Bilateral chest tubes are again noted. There is a trace left apical pneumothorax. There is a small right-sided pneumothorax. The right-sided pigtail catheter has retracted slightly from prior study.  Heart size is stable. There is persistent blunting of the costophrenic angles bilaterally and small bibasilar airspace opacities. IMPRESSION: 1. Lines and tubes as above. 2. Trace bilateral pneumothoraces, stable from prior study. 3. Otherwise, stable appearance of the chest. Electronically Signed   By: Constance Holster M.D.   On: 09/04/2019 05:40   Dg Chest Port 1 View  Result Date: 09/03/2019 CLINICAL DATA:  Mechanically assisted ventilation. EXAM: PORTABLE CHEST 1 VIEW COMPARISON:  09/01/2019 and subsequent  chest radiograph obtained today. FINDINGS: Interval proximally 65% right pneumothorax without mediastinal shift. Interval less than 5% left apical pneumothorax. Interval loculated normal thoraces or air in dilated, tortuous esophagus and gastric fundus in the left lower lung zone, described in the subsequent chest radiograph report. Right PICC tip in the mid superior vena cava. Stable post CABG changes, normal sized heart. Endotracheal tube tip 3.3 cm above the carina. This could be retracted 3.2 cm. The lung volumes remain hyperexpanded with mildly prominent interstitial markings and minimal atelectasis on the left. There is also minimal patchy opacity in the left mid and upper lung zones, not previously present. IMPRESSION: 1. Interval approximately 65% right pneumothorax without mediastinal shift. This was seen clinically with a subsequent 2nd right chest tube placed. 2. Interval less than 5% left apical pneumothorax. 3. Interval loculated normal thoraces or air in dilated esophagus and gastric fundus in the left lower lung zone. 4. Endotracheal tube tip 3.3 cm above the carina. This could be retracted 3.2 cm. 5. COPD. 6. Mild left basilar atelectasis and minimal patchy opacity in the left mid and upper lung zones, possibly representing developing pneumonia. This is not seen on the subsequent portable Electronically Signed   By: Claudie Revering M.D.   On: 09/03/2019 07:29   Dg Chest Port 1 View  Result Date: 09/03/2019 CLINICAL DATA:  Follow-up bilateral pneumothoraces EXAM: PORTABLE CHEST 1 VIEW COMPARISON:  Earlier today. Chest CT dated 08/31/2019. FINDINGS: Endotracheal tube tip 2.4 cm above the carina. This could be retracted 3.8 cm for better positioning. Less than 5% left apical pneumothorax with no significant change. Less than 5% right apical pneumothorax, significantly decreased in size. Bilateral chest tubes remain in place. A 2nd small caliber chest tube has been placed on the right. An elongated oval  collection of air is again demonstrated medially in the left mid and lower lung zone. This may represent air within a dilated esophagus, previously shown to extend into this region on the CT. Loculated pleural air is also possible. A similar area is again demonstrated at the left lung base, possibly representing loculated pleural air or gas in the gastric fundus. Stable normal sized heart, tortuous calcified thoracic aorta and post CABG changes. The lungs remain hyperexpanded. A small left pleural effusion is noted. Right PICC tip in the superior vena cava. Diffuse osteopenia. IMPRESSION: 1. Less than 5% right apical pneumothorax, significantly decreased in size. 2. Stable less than 5% left apical pneumothorax. 3. Interval small caliber right chest tube in addition to previously demonstrated bilateral chest tubes. 4. Stable oval collections of air in the left lower hemithorax, possibly representing intraluminal air or loculated pneumothoraces, as described above. 5. Stable changes of COPD. Electronically Signed   By: Claudie Revering M.D.   On: 09/03/2019 07:24   [place] Ekg Site Rite  Result Date: 09/02/2019 If Site Rite image not attached, placement could not be confirmed due to current cardiac rhythm.    Medications:     Scheduled Medications:  aspirin  81 mg  Per Tube Daily   atorvastatin  40 mg Oral q1800   chlorhexidine gluconate (MEDLINE KIT)  15 mL Mouth Rinse BID   Chlorhexidine Gluconate Cloth  6 each Topical Daily   colchicine  0.6 mg Oral BID   famotidine  20 mg Per Tube Daily   insulin aspart  0-9 Units Subcutaneous Q4H   mouth rinse  15 mL Mouth Rinse 10 times per day   predniSONE  35 mg Per Tube Q breakfast   Followed by   Derrill Memo ON 09/15/2019] predniSONE  30 mg Per Tube Q breakfast   Followed by   Derrill Memo ON 09/22/2019] predniSONE  25 mg Per Tube Q breakfast   Followed by   Derrill Memo ON 09/29/2019] predniSONE  20 mg Per Tube Q breakfast   Followed by   Derrill Memo ON 10/06/2019]  predniSONE  15 mg Per Tube Q breakfast   Followed by   Derrill Memo ON 10/20/2019] predniSONE  10 mg Per Tube Q breakfast   Followed by   Derrill Memo ON 11/03/2019] predniSONE  5 mg Per Tube Q breakfast   sodium chloride flush  10-40 mL Intracatheter Q12H    Infusions:  feeding supplement (VITAL AF 1.2 CAL) 45 mL/hr at 09/03/19 0600   fentaNYL infusion INTRAVENOUS 100 mcg/hr (09/04/19 0700)   heparin 1,050 Units/hr (09/04/19 0700)   norepinephrine (LEVOPHED) Adult infusion Stopped (09/04/19 0453)    PRN Medications: acetaminophen, fentaNYL (SUBLIMAZE) injection, ipratropium-albuterol, midazolam, ondansetron (ZOFRAN) IV, sodium chloride flush   Assessment/Plan:    1. Acute Hypoxic Respiratory Failure - Currently seems mainly due to bilateral large pleural effusions.  Now with bilateral chest tubes c/p by large PTX on R requiring small pigtail decompression. 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 On prednisone and colchicine - CT with small peripheral areas of infiltrate both upper lobes.  ESR 56, not markedly high.  No definite diagnosis but amiodarone has been stopped due to concern for possible toxicity and she is on prednisone and colchicine.  - No antibiotics, afebrile   - Tubes continue to drain. Management per CCM and TCTS, Continue prednisone and colchcine  2. Pleural effusions - see discussion above. Bilaterql CTs in and still draining. Suspect post-cardiotomy syndrome  3. Paroxysmal AFL with RVR - new. Unclear duration. - Given finding of post-cardiotomy syndrome and not primarily HF, doubt this is major driver of her decompensation - Stopped amiodarone given possible toxicity.  - HR ~110 when calm 120-130 when agitated - Will need eventual TEE-guided DCCV.  She is on heparin gtt.  - Can do at any time once stable and there will be no need to stop AC. Will d/w CCM  4. Acute on chronic diastolic HF - Echo EF 70-17% - Chest tubes now present bilaterally with good  drainage of pleural effusions.  - CVP 4-5. Continue to hold lasix. Check co-ox   5. CAD s/p CABG - no s/s ischemia. Continue ASA/statin  6. Subacute CVA - Neuro has seen  7. Aortic insufficiency - Mild to moderate on echo. - PHT 350 msec so probably at least moderate . Will reassess with TEE  8. Left neck mass - Noted on CT chest.  Probably multinodular goiter.    CRITICAL CARE Performed by: Glori Bickers  Total critical care time: 40 minutes  Critical care time was exclusive of separately billable procedures and treating other patients.  Critical care was necessary to treat or prevent imminent or life-threatening deterioration.  Critical care was time spent personally by me (independent of midlevel providers  or residents) on the following activities: development of treatment plan with patient and/or surrogate as well as nursing, discussions with consultants, evaluation of patient's response to treatment, examination of patient, obtaining history from patient or surrogate, ordering and performing treatments and interventions, ordering and review of laboratory studies, ordering and review of radiographic studies, pulse oximetry and re-evaluation of patient's condition.    Length of Stay: 9   Glori Bickers MD 09/04/2019, 8:17 AM  Advanced Heart Failure Team Pager [phone] (M-F; [place])  Please contact [facility] for night-coverage after hours (4p -7a ) and weekends on [email or web address]

## 2019-09-04 NOTE — Progress Notes (Signed)
TCTS DAILY ICU PROGRESS NOTE                   [address]            [place] [address]          [phone]       Total Length of Stay:  LOS: 9 days   Subjective: Patient on vent  Objective: Vital signs in last 24 hours: Temp:  [97.6 F (36.4 C)-98.8 F (37.1 C)] 98.6 F (37 C) (11/02 0800) Pulse Rate:  [95-133] 117 (11/02 0700) Cardiac Rhythm: Atrial flutter;Atrial fibrillation (11/02 0400) Resp:  [13-36] 18 (11/02 0700) BP: (61-182)/(44-113) 113/77 (11/02 0700) SpO2:  [90 %-100 %] 100 % (11/02 0737) FiO2 (%):  [40 %-50 %] 40 % (11/02 0737) Weight:  [63.9 kg] 63.9 kg (11/02 0500)  Filed Weights   09/02/19 0500 09/03/19 0500 09/04/19 0500  Weight: 68 kg 69 kg 63.9 kg    Weight change: -5.1 kg   Hemodynamic parameters for last 24 hours: CVP:  [2 mmHg-7 mmHg] 3 mmHg  Intake/Output from previous day: 11/01 0701 - 11/02 0700 In: 1236.4 [I.V.:871.4; NG/GT:355] Out: 1652 [Urine:712; Emesis/NG output:700; Chest Tube:240]  Intake/Output this shift: No intake/output data recorded.  Current Meds: Scheduled Meds: . aspirin  81 mg Per Tube Daily  . atorvastatin  40 mg Oral q1800  . chlorhexidine gluconate (MEDLINE KIT)  15 mL Mouth Rinse BID  . Chlorhexidine Gluconate Cloth  6 each Topical Daily  . colchicine  0.6 mg Oral BID  . famotidine  20 mg Per Tube Daily  . insulin aspart  0-9 Units Subcutaneous Q4H  . mouth rinse  15 mL Mouth Rinse 10 times per day  . predniSONE  35 mg Per Tube Q breakfast   Followed by  . [START ON 09/15/2019] predniSONE  30 mg Per Tube Q breakfast   Followed by  . [START ON 09/22/2019] predniSONE  25 mg Per Tube Q breakfast   Followed by  . [START ON 09/29/2019] predniSONE  20 mg Per Tube Q breakfast   Followed by  . [START ON 10/06/2019] predniSONE  15 mg Per Tube Q breakfast   Followed by  . [START ON 10/20/2019] predniSONE  10 mg Per Tube Q breakfast   Followed by  . [START ON 11/03/2019] predniSONE  5 mg Per Tube  Q breakfast  . sodium chloride flush  10-40 mL Intracatheter Q12H   Continuous Infusions: . feeding supplement (VITAL AF 1.2 CAL) 45 mL/hr at 09/03/19 0600  . fentaNYL infusion INTRAVENOUS 100 mcg/hr (09/04/19 0700)  . heparin 1,050 Units/hr (09/04/19 0700)  . norepinephrine (LEVOPHED) Adult infusion Stopped (09/04/19 0453)   PRN Meds:.acetaminophen, fentaNYL (SUBLIMAZE) injection, ipratropium-albuterol, midazolam, ondansetron (ZOFRAN) IV, sodium chloride flush  Heart: IRRR with RVR Lungs: Diminshed bibasilar breath sounds Wound: Sternal wound is clean and dry  Lab Results: CBC: Recent Labs    09/03/19 0648 09/04/19 0500  WBC 16.2* 16.0*  HGB 9.5* 8.5*  HCT 32.1* 28.7*  PLT 473* 412*   BMET:  Recent Labs    09/03/19 0648 09/04/19 0500  NA 136 138  K 3.6 3.4*  CL 99 100  CO2 24 27  GLUCOSE 128* 110*  BUN 91* 87*  CREATININE 1.18* 1.11*  CALCIUM 9.8 9.6    CMET: Lab Results  Component Value Date   WBC 16.0 (H) 09/04/2019   HGB 8.5 (L) 09/04/2019   HCT 28.7 (L) 09/04/2019   PLT 412 (  H) 09/04/2019   GLUCOSE 110 (H) 09/04/2019   CHOL 93 08/28/2019   TRIG 51 08/31/2019   HDL 30 (L) 08/28/2019   LDLCALC 42 08/28/2019   ALT 21 08/26/2019   AST 28 08/26/2019   NA 138 09/04/2019   K 3.4 (L) 09/04/2019   CL 100 09/04/2019   CREATININE 1.11 (H) 09/04/2019   BUN 87 (H) 09/04/2019   CO2 27 09/04/2019   INR 1.1 08/26/2019   HGBA1C 5.5 08/26/2019      PT/INR: No results for input(s): LABPROT, INR in the last 72 hours. Radiology: Dg Chest Port 1 View  Result Date: 09/04/2019 CLINICAL DATA:  Mechanical ventilation EXAM: PORTABLE CHEST 1 VIEW COMPARISON:  September 03, 2019 FINDINGS: The endotracheal tube terminates above the carina. The right-sided PICC line is well position. Bilateral chest tubes are again noted. There is a trace left apical pneumothorax. There is a small right-sided pneumothorax. The right-sided pigtail catheter has retracted slightly from prior  study. Heart size is stable. There is persistent blunting of the costophrenic angles bilaterally and small bibasilar airspace opacities. IMPRESSION: 1. Lines and tubes as above. 2. Trace bilateral pneumothoraces, stable from prior study. 3. Otherwise, stable appearance of the chest. Electronically Signed   By: Constance Holster M.D.   On: 09/04/2019 05:40     Assessment/Plan: Patient re admitted for CHF and intubated for hypoxia. Pulmonary-s/p 28 French chest tube 10/30 and 14 French right chest tube by CCM 11/01;also, 20 French left chest tube 10/30. Appears pigtail is not draining much. Chest tube overall output with 240 cc last 24 hours. CXR this am shows small right apical pneumothorax, small b/l pleural effusions. All 3 chest tubes are to suction and there is no air leak. There is no air leak. Check CXR in am. Consider placing left chest tube to water seal if output continues to decrease. Management per CCM and heart failure    Otillia Cordone Liston Alba  PA-C 09/04/2019 8:05 AM

## 2019-09-04 NOTE — Progress Notes (Signed)
NAME:  Lacey Coleman, MRN:  [id number], DOB:  04/01/1931, LOS: 9 ADMISSION DATE:  08/23/2019, CONSULTATION DATE:  08/26/2019 REFERRING MD:  Dr. Elesa Massed, CHIEF COMPLAINT:  Respiratory failure  Brief History   85 yo F PMH CAD s/p CABG in 08/2019, CHF, HTN, RA,A fib (eliquis dc 1 week ago)who presents 10/23 with AMS. Patient was found unresponsive and hypoxemic.  She did have a subacute infarct on MRI.  Hospital course has been complicated by bilateral pleural effusion with chest tube placement.  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  She has had intermittent hypotension requiring norepinephrine as well as atrial fibrillation with RVR.  There is also supposedly concern for amiodarone pulmonary toxicity.    Past Medical History  CHF, HTN, RA,A fib  Significant Hospital Events   Intubation on 10/23 Synchronized cardioversion on 11/2 PICC line placed on 10/31  Consults:  Heart failure Cardio thoracic surgery Neurology  Procedures:  10/24 endotracheal tube>>  Significant Diagnostic Tests:  LVEF on 10/24 shows relatively normal LV ejection fraction Personally reviewed her CT chest from 10/29.  There are bilateral pleural effusions with compressive atelectasis in the lower lobes.  There are patchy upper lobe predominant groundglass opacities which could be consistent with pulmonary edema.  I see no fibrosis reticular markings.  Micro Data:  Blood cultures are negative Respiratory panel negative Pleural fluid cultures negative Antimicrobials:  She is off all antibiotics.  She did receive a course of Vanco and cefepime  Interim history/subjective:  This morning she is awake and following commands and still in A. fib  Objective   Blood pressure 107/70, pulse (!) 130, temperature 98.6 F (37 C), resp. rate 19, height 5\' 4"  (1.626 m), weight 63.9 kg, SpO2 100 %. CVP:  [2 mmHg-7 mmHg] 5 mmHg  Vent Mode: PRVC FiO2  (%):  [40 %-50 %] 40 % Set Rate:  [18 bmp] 18 bmp Vt Set:  [440 mL] 440 mL PEEP:  [8 cmH20] 8 cmH20 Plateau Pressure:  [18 cmH20-22 cmH20] 19 cmH20   Intake/Output Summary (Last 24 hours) at 09/04/2019 0902 Last data filed at 09/04/2019 0800 Gross per 24 hour  Intake 1077.29 ml  Output 1797 ml  Net -719.71 ml   Filed Weights   09/02/19 0500 09/03/19 0500 09/04/19 0500  Weight: 68 kg 69 kg 63.9 kg    Examination: General: Elderly woman resting comfortably in bed, intubated HENT: Sclera anicteric Lungs: Initial, sounds with mechanical ventilation, no wheezing or rhonchi Cardiovascular: Tachycardic, irregularly irregular, 1 left-sided 28 French chest tube, 1 right-sided 28 French chest tube, 1 right-sided pigtail Abdomen: Scaphoid soft normal bowel sounds Extremities: No edema Neuro: Awake responds to verbal stimuli follows commands  Resolved Hospital Problem list   Possible cerebral infarct Atrial fibrillation with RVR Pneumothorax  Assessment & Plan:   Patient is an 85 year old woman with with history of recent CABG with complicated postoperative course.  She is being treated for:  Acute hypoxemic respiratory failure -Multifactorial most likely secondary to post  pericardotomy syndrome -Continue to maintain net even to slightly negative fluid balance  -On minimal ventilation settings, she did pass her SBT today. -She is on her ninth day of intubation.  We will try to extubate tomorrow 11/3.  Held off today due to cardioversion - Precedex for agitation and sedation, as needed fentanyl  Post Pericardotomy Syndrome  -He is on colchicine and prednisone taper.  Typical prednisone course for this syndrome is 0.2 to  0.5 milligrams per gram over 2 to 4 weeks for refractory symptoms.  She is unable to tolerate NSAIDs due to renal function  Atrial fibrillation with RVR -Post synchronized cardioversion at the bedside today -Maintain heparin drip -We will discuss initiation of  low-dose beta-blocker with cardiology tomorrow -Amiodarone is held for some concern for amiodarone pulmonary toxicity.  Now that her effusions are resolved could consider repeat CT chest to further characterize the lung parenchyma.   Bilateral pleural effusions -Status post chest tube placement x3.  Maintain all of these to wall suction until extubated. -Studies consistent with lymphocytic inflammatory effusion consistent with post pericardiotomy syndrome  Best practice:  Diet: NPO - NG to LWS for ileus Pain/Anxiety/Delirium protocol (if indicated): on fentanyl, use precedex second line. Scheduled tylenol VAP protocol (if indicated): Ordered DVT prophylaxis: heparin therapeutic GI prophylaxis: PPI Glucose control: BG<180 Foley: Continue Foley catheter.  Place external female catheter Mobility: OOB with assist.  We will consult PT Code Status: DNR-CCA - no chest compressions.  Family Communication: updated at the bedside today Disposition: needs ICU  Labs   CBC: Recent Labs  Lab 08/31/19 0251 09/01/19 0326 09/02/19 0356 09/03/19 0648 09/04/19 0500  WBC 12.5* 11.5* 10.3 16.2* 16.0*  HGB 8.6* 8.3* 8.7* 9.5* 8.5*  HCT 28.1* 26.8* 28.8* 32.1* 28.7*  MCV 72.2* 70.3* 70.4* 72.3* 72.7*  PLT 295 300 414* 473* 412*    Basic Metabolic Panel: Recent Labs  Lab 08/31/19 0251 09/01/19 0326 09/02/19 0356 09/03/19 0648 09/04/19 0500  NA 135 132* 136 136 138  K 4.1 3.8 3.5 3.6 3.4*  CL 99 91* 98 99 100  CO2 23 25 27 24 27   GLUCOSE 149* 98 120* 128* 110*  BUN 62* 76* 80* 91* 87*  CREATININE 0.99 1.14* 1.12* 1.18* 1.11*  CALCIUM 10.0 9.7 9.4 9.8 9.6   GFR: Estimated Creatinine Clearance: 31.4 mL/min (A) (by C-G formula based on SCr of 1.11 mg/dL (H)). Recent Labs  Lab 09/01/19 0326 09/02/19 0356 09/03/19 0648 09/04/19 0500  WBC 11.5* 10.3 16.2* 16.0*    Liver Function Tests: No results for input(s): AST, ALT, ALKPHOS, BILITOT, PROT, ALBUMIN in the last 168 hours. No  results for input(s): LIPASE, AMYLASE in the last 168 hours. No results for input(s): AMMONIA in the last 168 hours.  ABG    Component Value Date/Time   PHART 7.443 08/26/2019 1305   PCO2ART 29.5 (L) 08/26/2019 1305   PO2ART 213.0 (H) 08/26/2019 1305   HCO3 20.2 08/26/2019 1305   TCO2 21 (L) 08/26/2019 1305   ACIDBASEDEF 3.0 (H) 08/26/2019 1305   O2SAT 86.4 09/04/2019 0823     HbA1C: Hgb A1c MFr Bld  Date/Time Value Ref Range Status  08/26/2019 04:41 AM 5.5 4.8 - 5.6 % Final    Comment:    (NOTE) Pre diabetes:          5.7%-6.4% Diabetes:              >6.4% Glycemic control for   <7.0% adults with diabetes   07/16/2019 03:48 AM 5.5 4.8 - 5.6 % Final    Comment:    (NOTE)         Prediabetes: 5.7 - 6.4         Diabetes: >6.4         Glycemic control for adults with diabetes: <7.0     CBG: Recent Labs  Lab 09/03/19 1623 09/03/19 2045 09/04/19 0053 09/04/19 0451 09/04/19 0747  GLUCAP 143* 126* 107* 103* 99  Critical care time: 57 min   The patient is critically ill with multiple organ systems failure and requires high complexity decision making for assessment and support, frequent evaluation and titration of therapies, application of advanced monitoring technologies and extensive interpretation of multiple databases.   Critical Care Time devoted to patient care services described in this note is 57 minutes. This time reflects time of care of this [place] . This critical care time does not reflect separately billable procedures or procedure time, teaching time or supervisory time of PA/NP/Med student/Med Resident etc but could involve care discussion time.  [facility] 09/04/2019 2:35 PM  Pager: [phone] After hours pager: [phone]

## 2019-09-04 NOTE — Progress Notes (Signed)
  Echocardiogram 2D Echocardiogram has been performed.  Lacey Coleman 09/04/2019, 1:54 PM

## 2019-09-05 ENCOUNTER — Inpatient Hospital Stay (HOSPITAL_COMMUNITY): Payer: Medicare Other

## 2019-09-05 DIAGNOSIS — R410 Disorientation, unspecified: Secondary | ICD-10-CM

## 2019-09-05 DIAGNOSIS — I970 Postcardiotomy syndrome: Secondary | ICD-10-CM

## 2019-09-05 DIAGNOSIS — I480 Paroxysmal atrial fibrillation: Secondary | ICD-10-CM

## 2019-09-05 DIAGNOSIS — I6330 Cerebral infarction due to thrombosis of unspecified cerebral artery: Secondary | ICD-10-CM

## 2019-09-05 DIAGNOSIS — Z7189 Other specified counseling: Secondary | ICD-10-CM

## 2019-09-05 LAB — BASIC METABOLIC PANEL
Anion gap: 12 (ref 5–15)
BUN: 109 mg/dL — ABNORMAL HIGH (ref 8–23)
CO2: 23 mmol/L (ref 22–32)
Calcium: 9.4 mg/dL (ref 8.9–10.3)
Chloride: 104 mmol/L (ref 98–111)
Creatinine, Ser: 1.67 mg/dL — ABNORMAL HIGH (ref 0.44–1.00)
GFR calc Af Amer: 32 mL/min — ABNORMAL LOW (ref >60)
GFR calc non Af Amer: 27 mL/min — ABNORMAL LOW (ref >60)
Glucose, Bld: 105 mg/dL — ABNORMAL HIGH (ref 70–99)
Potassium: 4 mmol/L (ref 3.5–5.1)
Sodium: 139 mmol/L (ref 135–145)

## 2019-09-05 LAB — CBC
HCT: 26.2 % — ABNORMAL LOW (ref 36.0–46.0)
Hemoglobin: 7.7 g/dL — ABNORMAL LOW (ref 12.0–15.0)
MCH: 21.5 pg — ABNORMAL LOW (ref 26.0–34.0)
MCHC: 29.4 g/dL — ABNORMAL LOW (ref 30.0–36.0)
MCV: 73.2 fL — ABNORMAL LOW (ref 80.0–100.0)
Platelets: 523 10*3/uL — ABNORMAL HIGH (ref 150–400)
RBC: 3.58 MIL/uL — ABNORMAL LOW (ref 3.87–5.11)
RDW: 17.7 % — ABNORMAL HIGH (ref 11.5–15.5)
WBC: 27 10*3/uL — ABNORMAL HIGH (ref 4.0–10.5)
nRBC: 0 % (ref 0.0–0.2)

## 2019-09-05 LAB — HEPARIN LEVEL (UNFRACTIONATED)
Heparin Unfractionated: 0.69 IU/mL (ref 0.30–0.70)
Heparin Unfractionated: 0.49 IU/mL (ref 0.30–0.70)

## 2019-09-05 LAB — GLUCOSE, CAPILLARY
Glucose-Capillary: 108 mg/dL — ABNORMAL HIGH (ref 70–99)
Glucose-Capillary: 111 mg/dL — ABNORMAL HIGH (ref 70–99)
Glucose-Capillary: 122 mg/dL — ABNORMAL HIGH (ref 70–99)
Glucose-Capillary: 134 mg/dL — ABNORMAL HIGH (ref 70–99)
Glucose-Capillary: 68 mg/dL — ABNORMAL LOW (ref 70–99)
Glucose-Capillary: 77 mg/dL (ref 70–99)
Glucose-Capillary: 97 mg/dL (ref 70–99)

## 2019-09-05 MED ORDER — DEXTROSE 50 % IV SOLN
INTRAVENOUS | Status: AC
  Administered 2019-09-05: 25 mL
  Filled 2019-09-05: qty 50

## 2019-09-05 MED ORDER — LACTATED RINGERS IV SOLN
INTRAVENOUS | Status: AC
  Administered 2019-09-05: 10:00:00 via INTRAVENOUS

## 2019-09-05 MED ORDER — PREDNISONE 20 MG PO TABS
60.0000 mg | ORAL_TABLET | Freq: Every day | ORAL | Status: DC
  Administered 2019-09-05 – 2019-09-06 (×2): 60 mg via ORAL
  Filled 2019-09-05 (×2): qty 3

## 2019-09-05 MED ORDER — NOREPINEPHRINE 16 MG/250ML-% IV SOLN
0.0000 ug/min | INTRAVENOUS | Status: DC
  Administered 2019-09-06: 10 ug/min via INTRAVENOUS
  Administered 2019-09-06: 50 ug/min via INTRAVENOUS
  Filled 2019-09-05 (×2): qty 250

## 2019-09-05 MED ORDER — NOREPINEPHRINE 4 MG/250ML-% IV SOLN
0.0000 ug/min | INTRAVENOUS | Status: DC
  Administered 2019-09-05: 20 ug/min via INTRAVENOUS
  Administered 2019-09-05: 6 ug/min via INTRAVENOUS
  Administered 2019-09-05: 21 ug/min via INTRAVENOUS
  Filled 2019-09-05 (×4): qty 250

## 2019-09-05 MED ORDER — FENTANYL 2500MCG IN NS 250ML (10MCG/ML) PREMIX INFUSION
0.0000 ug/h | INTRAVENOUS | Status: DC
  Administered 2019-09-05: 50 ug/h via INTRAVENOUS
  Filled 2019-09-05: qty 250

## 2019-09-05 MED ORDER — METOPROLOL TARTRATE 25 MG PO TABS
25.0000 mg | ORAL_TABLET | Freq: Two times a day (BID) | ORAL | Status: DC
  Administered 2019-09-05: 25 mg
  Filled 2019-09-05: qty 1

## 2019-09-05 MED ORDER — COLCHICINE 0.6 MG PO TABS: 0.6000 mg | ORAL_TABLET | Freq: Every day | ORAL | Status: DC

## 2019-09-05 NOTE — Progress Notes (Signed)
ANTICOAGULATION CONSULT NOTE - Follow Up Consult  Pharmacy Consult for heparin Indication: atrial fibrillation  No Known Allergies  Patient Measurements: Height: 5\' 4"  (162.6 cm) Weight: 139 lb 5.3 oz (63.2 kg) IBW/kg (Calculated) : 54.7 Heparin Dosing Weight: 63.2 kg  Vital Signs: Temp: 99.9 F (37.7 C) (11/03 1600) Temp Source: Core (11/03 1600) BP: 104/66 (11/03 1600) Pulse Rate: 99 (11/03 1600)  Labs: Recent Labs    09/03/19 0648 09/04/19 0500 09/05/19 0339 09/05/19 1655  HGB 9.5* 8.5* 7.7*  --   HCT 32.1* 28.7* 26.2*  --   PLT 473* 412* 523*  --   HEPARINUNFRC 0.59 0.44 0.69 0.49  CREATININE 1.18* 1.11* 1.67*  --     Estimated Creatinine Clearance: 20.9 mL/min (A) (by C-G formula based on SCr of 1.67 mg/dL (H)).  Assessment: 85 yo F admitted for AMS, found unresponsive at home and intubated due to hypoxia. 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 Patient was started on apixaban for PAF, but this was stopped on 10/16 after a fall on 10/5 IV heparin for PAF with cardioversion planned this admission, completed 11/2. Targeting lower heparin goals with possible stroke.  Heparin level within range 0.49  Goal of Therapy:  Heparin level 0.3-0.5 units/ml, no boluses Monitor platelets by anticoagulation protocol: Yes   Plan:  Continue heparin 950 units/hr Monitor daily heparin level and CBC, s/sx bleeding  Barth Kirks, PharmD, BCPS, BCCCP Clinical Pharmacist [phone]  Please check AMION for all [place] numbers  09/05/2019 5:36 PM

## 2019-09-05 NOTE — Significant Event (Signed)
Family Meeting Documentation:  I had a family meeting at the bedside today with the patient's daughter, nurse practitioner with the palliative care team, Elmo Putt.  We discussed her mother's failure to wean from the ventilator despite 10 days, and treatment of reversible etiologies.  The patient in the past had expressed to be DNR but had been all right with intubation.  The patient's daughter today does not believe that she would want a recover in an LTACH, but her mother did express to her at some point that she wanted to fight.  The patient has several daughters 2 of which have been actively involved at the bedside.  We discussed hope that Ms. Kelliher does wake up and is able to pass an SBT for extubation, but I worry that should she not wake up and be safely extubated she would either need a tracheostomy or a one-way extubation.  We discussed reconvening tomorrow afternoon Wednesday with the intensivist team and palliative care to reevaluate her goals.    Total length of time for this visit was 35 minutes not inclusive of additional coordination of care with consulting providers.    Lenice Llamas, MD [facility] [place] Pager: [place]

## 2019-09-05 NOTE — Evaluation (Signed)
Occupational Therapy Evaluation Patient Details Name: Lacey Coleman MRN: [id number] DOB: [birth year] Today's Date: 09/05/2019    History of Present Illness 85 yo found unresponsive and hypoxemic. MRI (A) subacute infarct 18 mm focus of diffusion abnormality involving the subcortical left frontal lobe, most consistent with evolving late subacute ischemic infarct. No associated hemorrhage or mass effect. Pt with pleural effusion with chest tube placement as well as Afib RVR. PMH: CHF HTN RA AFIB CAD s/p CABG    Clinical Impression   PT admitted with L cauda nucleus CVA and found down. Pt currently with functional limitiations due to the deficits listed below (see OT problem list). Pt is total (A) for all adls. Pt could benefit from wound consult due to sacral wound.  Pt will benefit from skilled OT to increase their independence and safety with adls and balance to allow discharge ltach vs SNF pending progress.     Follow Up Recommendations  SNF    Equipment Recommendations  Wheelchair cushion (measurements OT);Wheelchair (measurements OT);Hospital bed    Recommendations for Other Services Other (comment)(palliative wound care and wound care PT)     Precautions / Restrictions Precautions Precautions: Fall Precaution Comments: vent/ flexiseal, x2 chest tubes Restrictions Weight Bearing Restrictions: No      Mobility Bed Mobility Overal bed mobility: Needs Assistance Bed Mobility: Rolling Rolling: Total assist         General bed mobility comments: pt requires total (A) for log rollign and total +2 total (A) for lines/ leads and tubes  Transfers                 General transfer comment: na    Balance                                           ADL either performed or assessed with clinical judgement   ADL Overall ADL's : Needs assistance/impaired                                       General ADL Comments: total (A) for all adls.  pt currentlyw ith leaking flexiseal and RN present     Vision         Perception     Praxis      Pertinent Vitals/Pain Pain Assessment: Faces Faces Pain Scale: Hurts little more Pain Location: generalized     Hand Dominance Right   Extremity/Trunk Assessment Upper Extremity Assessment Upper Extremity Assessment: Generalized weakness           Communication Communication Communication: Expressive difficulties   Cognition Arousal/Alertness: Awake/alert Behavior During Therapy: Restless Overall Cognitive Status: Difficult to assess                                 General Comments: following simple command with delay to give thumbs up   General Comments  wound noted and recommending wound care consult     Exercises     Shoulder Instructions      Home Living Family/patient expects to be discharged to:: Unsure  Prior Functioning/Environment Level of Independence: Independent                 OT Problem List: Pain;Decreased strength;Decreased activity tolerance;Impaired balance (sitting and/or standing);Decreased cognition;Decreased safety awareness;Decreased knowledge of use of DME or AE;Decreased knowledge of precautions;Cardiopulmonary status limiting activity;Impaired sensation      OT Treatment/Interventions: Self-care/ADL training;Therapeutic exercise;Neuromuscular education;Energy conservation;DME and/or AE instruction;Manual therapy;Modalities;Splinting;Therapeutic activities;Balance training;Patient/family education;Cognitive remediation/compensation    OT Goals(Current goals can be found in the care plan section) Acute Rehab OT Goals Patient Stated Goal: none stated OT Goal Formulation: Patient unable to participate in goal setting Time For Goal Achievement: 09/19/19 Potential to Achieve Goals: Good  OT Frequency: Min 2X/week   Barriers to D/C:            Co-evaluation  PT/OT/SLP Co-Evaluation/Treatment: Yes Reason for Co-Treatment: Complexity of the patient's impairments (multi-system involvement);For patient/therapist safety;Necessary to address cognition/behavior during functional activity PT goals addressed during session: Mobility/safety with mobility OT goals addressed during session: ADL's and self-care      AM-PAC OT "6 Clicks" Daily Activity     Outcome Measure Help from another person eating meals?: Total Help from another person taking care of personal grooming?: Total Help from another person toileting, which includes using toliet, bedpan, or urinal?: Total Help from another person bathing (including washing, rinsing, drying)?: Total Help from another person to put on and taking off regular upper body clothing?: Total Help from another person to put on and taking off regular lower body clothing?: Total 6 Click Score: 6   End of Session Equipment Utilized During Treatment: Oxygen Nurse Communication: Mobility status;Precautions  Activity Tolerance: Patient tolerated treatment well Patient left: in bed;with call bell/phone within reach;with nursing/sitter in room;with restraints reapplied;with SCD's reapplied  OT Visit Diagnosis: Unsteadiness on feet (R26.81)                Time: 9937-1696 OT Time Calculation (min): 30 min Charges:  OT General Charges $OT Visit: 1 Visit OT Evaluation $OT Eval Moderate Complexity: 1 Mod   Brynn, OTR/L  Acute Rehabilitation Services Pager: [phone] Office: [phone] .   Jeri Modena 09/05/2019, 5:21 PM

## 2019-09-05 NOTE — Progress Notes (Signed)
Hypoglycemic Event  CBG: 68  Treatment: D50 25 mL (12.5 gm)  Symptoms: Pale  Follow-up CBG: Time:19:57 CBG Result:134  Possible Reasons for Event: Inadequate meal intake  Comments/MD notified:Hypoglycemia protocol; will hold ssi and continue to closely monitor.    Lacey Coleman

## 2019-09-05 NOTE — Progress Notes (Signed)
ANTICOAGULATION CONSULT NOTE - Follow Up Consult  Pharmacy Consult for heparin Indication: atrial fibrillation  No Known Allergies  Patient Measurements: Height: '5\' 4"'$  (162.6 cm) Weight: 139 lb 5.3 oz (63.2 kg) IBW/kg (Calculated) : 54.7 Heparin Dosing Weight: 63.2 kg  Vital Signs: Temp: 99.5 F (37.5 C) (11/03 0330) Temp Source: Bladder (11/03 0330) BP: 89/41 (11/03 0730) Pulse Rate: 109 (11/03 0730)  Labs: Recent Labs    09/03/19 0648 09/04/19 0500 09/05/19 0339  HGB 9.5* 8.5* 7.7*  HCT 32.1* 28.7* 26.2*  PLT 473* 412* 523*  HEPARINUNFRC 0.59 0.44 0.69  CREATININE 1.18* 1.11* 1.67*    Estimated Creatinine Clearance: 20.9 mL/min (A) (by C-G formula based on SCr of 1.67 mg/dL (H)).   Medical History: Past Medical History:  Diagnosis Date  . CAD (coronary artery disease)    9/14 LHC with EF 25-35%, mod elevated LVEDP, dLM 90%, pLCx80%, pLAD to mLAD 90%, 1st diag 80%, mid Cx 100%, pRCA 90%, pRCA to mRCA 100%  . Heart failure with reduced ejection fraction ([place])    a) 9/13 echo with EF 30-35%, mild LVF, diffuse hypokinesis, mod aortic calcification, mild AR  . Hypertension   . Rheumatoid arthritis ([facility])     Medications:  Scheduled:  . acetaminophen (TYLENOL) oral liquid 160 mg/5 mL  650 mg Per Tube BID  . aspirin  81 mg Per Tube Daily  . atorvastatin  40 mg Oral q1800  . chlorhexidine gluconate (MEDLINE KIT)  15 mL Mouth Rinse BID  . Chlorhexidine Gluconate Cloth  6 each Topical Daily  . colchicine  0.6 mg Oral BID  . famotidine  20 mg Per Tube Daily  . insulin aspart  0-9 Units Subcutaneous Q4H  . mouth rinse  15 mL Mouth Rinse 10 times per day  . metoprolol tartrate  12.5 mg Per Tube BID  . predniSONE  35 mg Per Tube Q breakfast   Followed by  . [START ON 09/15/2019] predniSONE  30 mg Per Tube Q breakfast   Followed by  . [START ON 09/22/2019] predniSONE  25 mg Per Tube Q breakfast   Followed by  . [START ON 09/29/2019] predniSONE  20 mg Per Tube Q  breakfast   Followed by  . [START ON 10/06/2019] predniSONE  15 mg Per Tube Q breakfast   Followed by  . [START ON 10/20/2019] predniSONE  10 mg Per Tube Q breakfast   Followed by  . [START ON 11/03/2019] predniSONE  5 mg Per Tube Q breakfast  . sodium chloride flush  10-40 mL Intracatheter Q12H   Infusions:  . sodium chloride 20 mL/hr at 09/05/19 0700  . dexmedetomidine (PRECEDEX) IV infusion 0.6 mcg/kg/hr (09/05/19 0700)  . feeding supplement (VITAL AF 1.2 CAL) 45 mL/hr at 09/03/19 0600  . heparin 1,050 Units/hr (09/05/19 0700)    Assessment: 85 yo F admitted for AMS, found unresponsive at home and intubated due to hypoxia. 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 Patient was started on apixaban for PAF, but this was stopped on 10/16 after a fall on 10/5. Pharmacy consulted to initiate IV heparin for PAF with cardioversion planned this admission, completed 11/2. Targeting lower heparin goals with possible stroke.  Heparin level now high for lower goal at 0.69. Hg down to 7.7, plt up to 523. No bleeding or issues with infusion per discussion with RN.  Goal of Therapy:  Heparin level 0.3-0.5 units/ml, no boluses Monitor platelets by anticoagulation protocol: Yes   Plan:  Decrease heparin to 950 units/hr Monitor daily heparin level and CBC, s/sx bleeding F/u anticoagulation plan   Elicia Lamp, PharmD, BCPS Please check AMION for all [facility] contact numbers Clinical Pharmacist 09/05/2019 7:40 AM

## 2019-09-05 NOTE — Evaluation (Signed)
Physical Therapy Evaluation Patient Details Name: Lacey Coleman MRN: [id number] DOB: 01-03-1931 Today's Date: 09/05/2019   History of Present Illness  85 yo found unresponsive and hypoxemic. MRI (A) subacute infarct 18 mm focus of diffusion abnormality involving the subcortical left frontal lobe, most consistent with evolving late subacute ischemic infarct. No associated hemorrhage or mass effect. Pt with pleural effusion with chest tube placement as well as Afib RVR. PMH: CHF HTN RA AFIB CAD s/p CABG   Clinical Impression  Pt found down in her home with evidence of CVA. Pt s/p 9/20 CABG with decline in function. Pt currently limited in safe mobility by limited cognition, able to respond to single step commands with increased time and multimodal cues. Pt rectal tube with decreased function and bed soiled. Pt required Total A for rolling L and R for pericare, and placement of new sacral foam dressing. Due to uncontrollable fecal incontinence and fouling of sacral wound, recommend assess from [facility] nurse for wound treatment. Given pt multisystem involvement and decreased strength and mobility PT recommending SNF vs LTACH level of rehab at d/c. PT will continue to follow acutely.     Follow Up Recommendations SNF;LTACH    Equipment Recommendations  Other (comment)(TBD at next venue)    Recommendations for Other Services Other (comment)(WOC nurse consult)     Precautions / Restrictions Precautions Precautions: Fall Precaution Comments: vent/ flexiseal, x2 chest tubes Restrictions Weight Bearing Restrictions: No      Mobility  Bed Mobility Overal bed mobility: Needs Assistance Bed Mobility: Rolling Rolling: Total assist         General bed mobility comments: pt requires total (A) for log rolling and total +2 total (A) for lines/ leads and tubes  Transfers                 General transfer comment: na           Pertinent Vitals/Pain Pain Assessment: Faces Faces Pain  Scale: Hurts little more Pain Location: generalized Pain Descriptors / Indicators: Discomfort;Grimacing Pain Intervention(s): Limited activity within patient's tolerance;Monitored during session;Repositioned    Home Living Family/patient expects to be discharged to:: Unsure                      Prior Function Level of Independence: Independent               Hand Dominance   Dominant Hand: Right    Extremity/Trunk Assessment   Upper Extremity Assessment Upper Extremity Assessment: Defer to OT evaluation    Lower Extremity Assessment Lower Extremity Assessment: Generalized weakness       Communication   Communication: Expressive difficulties  Cognition Arousal/Alertness: Awake/alert Behavior During Therapy: Restless Overall Cognitive Status: Difficult to assess                                 General Comments: following simple command with delay to give thumbs up      General Comments General comments (skin integrity, edema, etc.): wound noted and recommending wound care consult         Assessment/Plan    PT Assessment Patient needs continued PT services  PT Problem List Decreased strength;Decreased range of motion;Decreased activity tolerance;Decreased balance;Decreased mobility;Decreased coordination;Decreased cognition;Cardiopulmonary status limiting activity       PT Treatment Interventions DME instruction;Gait training;Functional mobility training;Therapeutic activities;Therapeutic exercise;Balance training;Cognitive remediation;Patient/family education    PT Goals (Current goals can be found  in the Care Plan section)  Acute Rehab PT Goals Patient Stated Goal: none stated PT Goal Formulation: Patient unable to participate in goal setting Time For Goal Achievement: 09/19/19 Potential to Achieve Goals: Fair    Frequency Min 2X/week        Co-evaluation PT/OT/SLP Co-Evaluation/Treatment: Yes Reason for Co-Treatment:  Complexity of the patient's impairments (multi-system involvement) PT goals addressed during session: Mobility/safety with mobility OT goals addressed during session: ADL's and self-care       AM-PAC PT "6 Clicks" Mobility  Outcome Measure Help needed turning from your back to your side while in a flat bed without using bedrails?: Total Help needed moving from lying on your back to sitting on the side of a flat bed without using bedrails?: Total Help needed moving to and from a bed to a chair (including a wheelchair)?: Total Help needed standing up from a chair using your arms (e.g., wheelchair or bedside chair)?: Total Help needed to walk in hospital room?: Total Help needed climbing 3-5 steps with a railing? : Total 6 Click Score: 6    End of Session Equipment Utilized During Treatment: Oxygen Activity Tolerance: Patient limited by lethargy Patient left: in bed;with call bell/phone within reach;with family/visitor present;with nursing/sitter in room Nurse Communication: Mobility status;Other (comment)(recommendation for [facility] nurse eval of sacral wound) PT Visit Diagnosis: Unsteadiness on feet (R26.81);Other abnormalities of gait and mobility (R26.89);Muscle weakness (generalized) (M62.81);History of falling (Z91.81);Difficulty in walking, not elsewhere classified (R26.2);Other symptoms and signs involving the nervous system (R29.898)    Time: 1761-6073 PT Time Calculation (min) (ACUTE ONLY): 30 min   Charges:   PT Evaluation $PT Eval High Complexity: 1 High          Kaedynce Tapp B. Migdalia Dk PT, DPT Acute Rehabilitation Services Pager [phone] Office [phone]   [place] 09/05/2019, 5:32 PM

## 2019-09-05 NOTE — Progress Notes (Signed)
Patient ID: Lacey Coleman, female   DOB: [birth year], 85 y.o.   MRN: [id number]    Advanced Heart Failure Rounding Note   Subjective:    Now s/p bilateral chest tube placement on 10/30. Reviewed with CCM, probably lymphocyte-predominant exudative effusion but incomplete data.   Remains intubated. Awake on vent. Draining from both CTs. Serous fluid about 600cc total.   Underwent TEE/DC0CV yesterday with conversion to NSR. Remains in NSR.   LVEF 60-65% on TEE. AI is mild.   CVP 4-5 this am (measured personally). CXR stable. Off pressors.   Objective:   Weight Range:  Vital Signs:   Temp:  [98.2 F (36.8 C)-99.9 F (37.7 C)] 99.5 F (37.5 C) (11/03 0330) Pulse Rate:  [16-135] 109 (11/03 0730) Resp:  [12-39] 25 (11/03 0730) BP: (74-145)/(41-111) 89/41 (11/03 0730) SpO2:  [98 %-100 %] 100 % (11/03 0730) FiO2 (%):  [40 %] 40 % (11/03 0400) Weight:  [63.2 kg] 63.2 kg (11/03 0500) Last BM Date: 09/02/19  Weight change: Filed Weights   09/03/19 0500 09/04/19 0500 09/05/19 0500  Weight: 69 kg 63.9 kg 63.2 kg    Intake/Output:   Intake/Output Summary (Last 24 hours) at 09/05/2019 0815 Last data filed at 09/05/2019 0700 Gross per 24 hour  Intake 677.77 ml  Output 938 ml  Net -260.23 ml     Physical Exam: General:  Awake on vent mildly sedated but will arouse HEENT: normal + ETT Neck: supple. no JVD. Carotids 2+ bilat; no bruits. No lymphadenopathy or thryomegaly appreciated. Cor: PMI nondisplaced. Regular rate & rhythm. No rubs, gallops or murmurs. Lungs: coarse Abdomen: soft, nontender, nondistended. No hepatosplenomegaly. No bruits or masses. Good bowel sounds. Extremities: no cyanosis, clubbing, rash, edema + bootsNeuro: Awake on vent mildly sedated but will arouse    Telemetry: Sinus/ST 95-110 Personally reviewed    Labs: Basic Metabolic Panel: Recent Labs  Lab 09/01/19 0326 09/02/19 0356 09/03/19 0648 09/04/19 0500 09/05/19 0339  NA 132* 136 136 138 139   K 3.8 3.5 3.6 3.4* 4.0  CL 91* 98 99 100 104  CO2 _0 GLUCOSE 98 120* 128* 110* 105*  BUN 76* 80* 91* 87* 109*  CREATININE 1.14* 1.12* 1.18* 1.11* 1.67*  CALCIUM 9.7 9.4 9.8 9.6 9.4    Liver Function Tests: No results for input(s): AST, ALT, ALKPHOS, BILITOT, PROT, ALBUMIN in the last 168 hours. No results for input(s): LIPASE, AMYLASE in the last 168 hours. No results for input(s): AMMONIA in the last 168 hours.  CBC: Recent Labs  Lab 09/01/19 0326 09/02/19 0356 09/03/19 0648 09/04/19 0500 09/05/19 0339  WBC 11.5* 10.3 16.2* 16.0* 27.0*  HGB 8.3* 8.7* 9.5* 8.5* 7.7*  HCT 26.8* 28.8* 32.1* 28.7* 26.2*  MCV 70.3* 70.4* 72.3* 72.7* 73.2*  PLT 300 414* 473* 412* 523*    Cardiac Enzymes: No results for input(s): CKTOTAL, CKMB, CKMBINDEX, TROPONINI in the last 168 hours.  BNP: BNP (last 3 results) Recent Labs    07/16/19 0141 08/26/19 0441 08/31/19 0251  BNP 2,027.0* 497.7* 275.5*    ProBNP (last 3 results) No results for input(s): PROBNP in the last 8760 hours.    Other results:  Imaging: Dg Chest Port 1 View  Result Date: 09/05/2019 CLINICAL DATA:  Pleural effusion EXAM: PORTABLE CHEST 1 VIEW COMPARISON:  September 04, 2019 FINDINGS: There is a trace right sided pneumothorax, significantly improved from prior study. There is no significant left-sided pneumothorax. Bilateral chest tubes are in place. The right-sided  PICC line is stable in positioning. The endotracheal tube terminates above the carina. There are persistent small bilateral pleural effusions. There are persistent bibasilar airspace opacities. IMPRESSION: 1. Lines and tubes as above. 2. Trace right-sided pneumothorax, improved from prior study. No evidence for a significant left-sided pneumothorax. 3. Small bilateral pleural effusions. 4. Persistent bibasilar airspace opacities. Electronically Signed   By: Constance Holster M.D.   On: 09/05/2019 05:43   Dg Chest Port 1 View  Result Date:  09/04/2019 CLINICAL DATA:  Mechanical ventilation EXAM: PORTABLE CHEST 1 VIEW COMPARISON:  September 03, 2019 FINDINGS: The endotracheal tube terminates above the carina. The right-sided PICC line is well position. Bilateral chest tubes are again noted. There is a trace left apical pneumothorax. There is a small right-sided pneumothorax. The right-sided pigtail catheter has retracted slightly from prior study. Heart size is stable. There is persistent blunting of the costophrenic angles bilaterally and small bibasilar airspace opacities. IMPRESSION: 1. Lines and tubes as above. 2. Trace bilateral pneumothoraces, stable from prior study. 3. Otherwise, stable appearance of the chest. Electronically Signed   By: Constance Holster M.D.   On: 09/04/2019 05:40     Medications:     Scheduled Medications: . acetaminophen (TYLENOL) oral liquid 160 mg/5 mL  650 mg Per Tube BID  . aspirin  81 mg Per Tube Daily  . atorvastatin  40 mg Oral q1800  . chlorhexidine gluconate (MEDLINE KIT)  15 mL Mouth Rinse BID  . Chlorhexidine Gluconate Cloth  6 each Topical Daily  . colchicine  0.6 mg Oral BID  . famotidine  20 mg Per Tube Daily  . insulin aspart  0-9 Units Subcutaneous Q4H  . mouth rinse  15 mL Mouth Rinse 10 times per day  . metoprolol tartrate  12.5 mg Per Tube BID  . predniSONE  35 mg Per Tube Q breakfast   Followed by  . [START ON 09/15/2019] predniSONE  30 mg Per Tube Q breakfast   Followed by  . [START ON 09/22/2019] predniSONE  25 mg Per Tube Q breakfast   Followed by  . [START ON 09/29/2019] predniSONE  20 mg Per Tube Q breakfast   Followed by  . [START ON 10/06/2019] predniSONE  15 mg Per Tube Q breakfast   Followed by  . [START ON 10/20/2019] predniSONE  10 mg Per Tube Q breakfast   Followed by  . [START ON 11/03/2019] predniSONE  5 mg Per Tube Q breakfast  . sodium chloride flush  10-40 mL Intracatheter Q12H    Infusions: . sodium chloride 20 mL/hr at 09/05/19 0700  . dexmedetomidine  (PRECEDEX) IV infusion 0.6 mcg/kg/hr (09/05/19 0700)  . feeding supplement (VITAL AF 1.2 CAL) 45 mL/hr at 09/03/19 0600  . heparin 1,050 Units/hr (09/05/19 0700)    PRN Medications: acetaminophen, fentaNYL (SUBLIMAZE) injection, ipratropium-albuterol, metoprolol tartrate, ondansetron (ZOFRAN) IV, sodium chloride flush   Assessment/Plan:    1. Acute Hypoxic Respiratory Failure - Currently seems mainly due to bilateral large pleural effusions.  Now with bilateral chest tubes c/p by large PTX on R requiring small pigtail decompression. 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 On prednisone and colchicine - CT with small peripheral areas of infiltrate both upper lobes.  ESR 56, not markedly high.  No definite diagnosis but amiodarone has been stopped due to concern for possible toxicity and she is on prednisone and colchicine.  - No antibiotics, afebrile   - Tubes continue to drain. Management per CCM and TCTS, Continue prednisone and colchicine -  CXR stable - Given ongoing drainage would consider increasing prednisone dose   2. Pleural effusions - see discussion above. Bilaterql CTs in and still draining. 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 Paroxysmal AFL with RVR - new. Unclear duration. - Given finding of post-cardiotomy syndrome and not primarily HF, doubt this is major driver of her decompensation - Stopped amiodarone given possible toxicity.  - s/p TEE/DC-CV on 11/2 - Maintaining NSR - She is on heparin gtt. Can switch to Eliquis ina  Day or two if no further procedures planned  4. Acute on chronic diastolic HF - Echo EF 35-46% on TEE - Chest tubes now present bilaterally with good drainage of pleural effusions.  - CVP 4-5. Continue to hold lasix. Co-ox ok   5. CAD s/p CABG - no s/s ischemia. Continue ASA/statin  6. Subacute CVA - Neuro has seen  7. Aortic insufficiency - Mild to moderate on echo. TEE confirms mild    8.  Left neck mass - Noted on CT chest.  Probably multinodular goiter.    CRITICAL CARE Performed by: Glori Bickers  Total critical care time: 35 minutes  Critical care time was exclusive of separately billable procedures and treating other patients.  Critical care was necessary to treat or prevent imminent or life-threatening deterioration.  Critical care was time spent personally by me (independent of midlevel providers or residents) on the following activities: development of treatment plan with patient and/or surrogate as well as nursing, discussions with consultants, evaluation of patient's response to treatment, examination of patient, obtaining history from patient or surrogate, ordering and performing treatments and interventions, ordering and review of laboratory studies, ordering and review of radiographic studies, pulse oximetry and re-evaluation of patient's condition.    Length of Stay: 10   Glori Bickers MD 09/05/2019, 8:15 AM  Advanced Heart Failure Team Pager [phone] (M-F; [place])  Please contact [facility] for night-coverage after hours (4p -7a ) and weekends on [email or web address]

## 2019-09-05 NOTE — Progress Notes (Signed)
RT attempted to wean on CPAP/PS two times this am. RR in the 40's. MD made aware. Patient is now on full support. RT will continue to monitor.

## 2019-09-05 NOTE — Progress Notes (Signed)
Palliative:  HPI: 85 YO female with PMH CAD s/p recent CABG, cardiomyopathy, CHF, HTN, RA, atrial fibrillation, recent admission 9/14-9/21 with NSTEMI and acute systolic heart failure and underwent CABG admitted 08/26/2019 with respiratory failure requiring ventilator support and chest tubes. HR/atrial fib is stabilized. She continues unable to wean from vent. Prognosis is poor.   I met today at Lacey Coleman's bedside with daughter/HCPOA, Lacey Coleman, and Dr. Shearon Stalls. We discussed failed SBT and weaning trial today and that she is unable to successfully extubate at this time. We discussed impending decision for tracheostomy vs one way extubation. We discussed the reality of what a tracheostomy would entail and fear that her prognosis remains poor for improvement to previous QOL.   Upon further conversation with Lacey Coleman she questions the decision because she did not feel that her mother would have wanted to pursue CABG but made the decision to proceed. Lacey Coleman and her sister asked Lacey Coleman if she wished to continue aggressive care or if she was tired and ready to go be with her husband (who died from dementia ~4 years ago). Lacey Coleman indicated to them just a couple days ago that she wishes to continue with aggressive care and "keep fighting."   Lacey Coleman would like to try and discuss this further with her mother if given the opportunity. She appears uncomfortable and unable to participate in discussion today (although discussion was at her bedside). Lacey Coleman does not seem to believe that tracheostomy would be what her mother wants but questions this because she did not feel that she would have wanted CABG either.   We agree to meet again tomorrow with her sister, Lacey Coleman, as well to further discuss options and see if her mother can participate and indicate her wishes.   Exam: Alert, distressed. Pale. Elderly, frail. Appears uncomfortable and restless. BP stable on Levophed. On vent. Abd flat. Warm to touch.   Plan: - Meet  with patient and family tomorrow to discuss tracheostomy vs one way extubation.   69 min  [name], NP Palliative Medicine Team Pager [phone] (Please see [email or web address] for schedule) Team Phone [phone]    Greater than 50%  of this time was spent counseling and coordinating care related to the above assessment and plan

## 2019-09-05 NOTE — Progress Notes (Signed)
TCTS DAILY ICU PROGRESS NOTE                   [address]            [place] [address]          [phone]       Total Length of Stay:  LOS: 10 days   Subjective: Patient on vent  Objective: Vital signs in last 24 hours: Temp:  [98.2 F (36.8 C)-99.9 F (37.7 C)] 99.5 F (37.5 C) (11/03 0330) Pulse Rate:  [16-135] 109 (11/03 0730) Cardiac Rhythm: Sinus tachycardia (11/03 0400) Resp:  [12-39] 25 (11/03 0730) BP: (74-145)/(41-111) 89/41 (11/03 0730) SpO2:  [98 %-100 %] 100 % (11/03 0730) FiO2 (%):  [40 %] 40 % (11/03 0400) Weight:  [63.2 kg] 63.2 kg (11/03 0500)  Filed Weights   09/03/19 0500 09/04/19 0500 09/05/19 0500  Weight: 69 kg 63.9 kg 63.2 kg    Weight change: -0.7 kg   Hemodynamic parameters for last 24 hours: CVP:  [0 mmHg-22 mmHg] 9 mmHg  Intake/Output from previous day: 11/02 0701 - 11/03 0700 In: 727.8 [I.V.:577.8; NG/GT:130] Out: 1158 [Urine:588; Chest Tube:570]  Intake/Output this shift: No intake/output data recorded.  Current Meds: Scheduled Meds: . acetaminophen (TYLENOL) oral liquid 160 mg/5 mL  650 mg Per Tube BID  . aspirin  81 mg Per Tube Daily  . atorvastatin  40 mg Oral q1800  . chlorhexidine gluconate (MEDLINE KIT)  15 mL Mouth Rinse BID  . Chlorhexidine Gluconate Cloth  6 each Topical Daily  . colchicine  0.6 mg Oral BID  . famotidine  20 mg Per Tube Daily  . insulin aspart  0-9 Units Subcutaneous Q4H  . mouth rinse  15 mL Mouth Rinse 10 times per day  . metoprolol tartrate  12.5 mg Per Tube BID  . predniSONE  35 mg Per Tube Q breakfast   Followed by  . [START ON 09/15/2019] predniSONE  30 mg Per Tube Q breakfast   Followed by  . [START ON 09/22/2019] predniSONE  25 mg Per Tube Q breakfast   Followed by  . [START ON 09/29/2019] predniSONE  20 mg Per Tube Q breakfast   Followed by  . [START ON 10/06/2019] predniSONE  15 mg Per Tube Q breakfast   Followed by  . [START ON 10/20/2019] predniSONE  10 mg Per  Tube Q breakfast   Followed by  . [START ON 11/03/2019] predniSONE  5 mg Per Tube Q breakfast  . sodium chloride flush  10-40 mL Intracatheter Q12H   Continuous Infusions: . sodium chloride 20 mL/hr at 09/05/19 0700  . dexmedetomidine (PRECEDEX) IV infusion 0.6 mcg/kg/hr (09/05/19 0700)  . feeding supplement (VITAL AF 1.2 CAL) 45 mL/hr at 09/03/19 0600  . heparin 1,050 Units/hr (09/05/19 0700)   PRN Meds:.acetaminophen, fentaNYL (SUBLIMAZE) injection, ipratropium-albuterol, metoprolol tartrate, ondansetron (ZOFRAN) IV, sodium chloride flush  Heart: Slightly tachy  Lungs: Diminshed bibasilar breath sounds Wound: Sternal wound is clean and dry  Lab Results: CBC: Recent Labs    09/04/19 0500 09/05/19 0339  WBC 16.0* 27.0*  HGB 8.5* 7.7*  HCT 28.7* 26.2*  PLT 412* 523*   BMET:  Recent Labs    09/04/19 0500 09/05/19 0339  NA 138 139  K 3.4* 4.0  CL 100 104  CO2 27 23  GLUCOSE 110* 105*  BUN 87* 109*  CREATININE 1.11* 1.67*  CALCIUM 9.6 9.4    CMET: Lab Results  Component Value  Date   WBC 27.0 (H) 09/05/2019   HGB 7.7 (L) 09/05/2019   HCT 26.2 (L) 09/05/2019   PLT 523 (H) 09/05/2019   GLUCOSE 105 (H) 09/05/2019   CHOL 93 08/28/2019   TRIG 51 08/31/2019   HDL 30 (L) 08/28/2019   LDLCALC 42 08/28/2019   ALT 21 08/26/2019   AST 28 08/26/2019   NA 139 09/05/2019   K 4.0 09/05/2019   CL 104 09/05/2019   CREATININE 1.67 (H) 09/05/2019   BUN 109 (H) 09/05/2019   CO2 23 09/05/2019   INR 1.1 08/26/2019   HGBA1C 5.5 08/26/2019      PT/INR: No results for input(s): LABPROT, INR in the last 72 hours. Radiology: Dg Chest Port 1 View  Result Date: 09/05/2019 CLINICAL DATA:  Pleural effusion EXAM: PORTABLE CHEST 1 VIEW COMPARISON:  September 04, 2019 FINDINGS: There is a trace right sided pneumothorax, significantly improved from prior study. There is no significant left-sided pneumothorax. Bilateral chest tubes are in place. The right-sided PICC line is stable in  positioning. The endotracheal tube terminates above the carina. There are persistent small bilateral pleural effusions. There are persistent bibasilar airspace opacities. IMPRESSION: 1. Lines and tubes as above. 2. Trace right-sided pneumothorax, improved from prior study. No evidence for a significant left-sided pneumothorax. 3. Small bilateral pleural effusions. 4. Persistent bibasilar airspace opacities. Electronically Signed   By: Constance Holster M.D.   On: 09/05/2019 05:43     Assessment/Plan: Patient re admitted for CHF and intubated for hypoxia. Pulmonary-s/p 28 French chest tube 10/30 and 14 French right chest tube by CCM 11/01;also, 20 French left chest tube 10/30.  Chest tube overall output with 570 cc last 24 hours. CXR this am shows trace right apical pneumothorax, small b/l pleural effusions. All 3 chest tubes are to suction and there is no air leak.  Chest tubes with a fair amount of output last 24 hours. Chest tubes to remain to suction for now. Check CXR in am.  Hopefully, she will be extubated today. On Prednisone taper and Colchicine for post pericardotomy syndrome. Management per CCM and heart failure     Liston Alba  PA-C 09/05/2019 7:50 AM

## 2019-09-05 NOTE — Progress Notes (Addendum)
NAME:  Lacey Coleman, MRN:  [id number], DOB:  09/20/1931, LOS: 10 ADMISSION DATE:  08/10/2019, CONSULTATION DATE:  08/26/2019 REFERRING MD:  Dr. Elesa MassedWard, CHIEF COMPLAINT:  Respiratory failure  Brief History   85 yo F PMH CAD s/p CABG in 08/2019, CHF, HTN, RA,A fib (eliquis dc 1 week ago)who presents 10/23 with AMS. Patient was found unresponsive and hypoxemic.  She did have a subacute infarct on MRI.  Hospital course has been complicated by bilateral pleural effusion with chest tube placement.  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  She has had intermittent hypotension requiring norepinephrine as well as atrial fibrillation with RVR.  There is also concern for acute amiodarone pulmonary toxicity.    Past Medical History  CHF, HTN, RA,A fib  Significant Hospital Events   Intubation on 10/23 Synchronized cardioversion on 11/2 PICC line placed on 10/31  Consults:  Heart failure Cardio thoracic surgery Neurology  Procedures:  10/24 endotracheal tube>>  Significant Diagnostic Tests:  LVEF on 10/24 shows relatively normal LV ejection fraction Personally reviewed her CT chest from 10/29.  There are bilateral pleural effusions with compressive atelectasis in the lower lobes.  There are patchy upper lobe predominant groundglass opacities which could be consistent with pulmonary edema.  I see no fibrosis reticular markings.  Micro Data:  Blood cultures are negative Respiratory panel negative Pleural fluid cultures negative Antimicrobials:  She is off all antibiotics.  She did receive a course of Vanco and cefepime previously  Interim history/subjective:  This morning she is a little less alert than last night. She is also tachypnic and not following commands.  Objective   Blood pressure (!) 89/41, pulse (!) 109, temperature 99.5 F (37.5 C), temperature source Bladder, resp. rate (!) 25, height 5\' 4"  (1.626 m),  weight 63.2 kg, SpO2 99 %. CVP:  [0 mmHg-22 mmHg] 9 mmHg  Vent Mode: PRVC FiO2 (%):  [40 %] 40 % Set Rate:  [18 bmp] 18 bmp Vt Set:  [440 mL] 440 mL PEEP:  [8 cmH20] 8 cmH20 Pressure Support:  [12 cmH20] 12 cmH20 Plateau Pressure:  [18 cmH20-27 cmH20] 18 cmH20   Intake/Output Summary (Last 24 hours) at 09/05/2019 0841 Last data filed at 09/05/2019 0700 Gross per 24 hour  Intake 677.77 ml  Output 938 ml  Net -260.23 ml   Filed Weights   09/03/19 0500 09/04/19 0500 09/05/19 0500  Weight: 69 kg 63.9 kg 63.2 kg    Examination: General: Elderly woman resting comfortably in bed, intubated, appears uncomfortable.  HENT: Sclera anicteric Lungs: Initial, sounds with mechanical ventilation, no wheezing or rhonchi Cardiovascular: Tachycardic, regular, 1 left-sided 28 French chest tube, 1 right-sided 28 French chest tube, 1 right-sided pigtail Abdomen: Scaphoid, soft normal bowel sounds Extremities: No edema Neuro: Awake responds to verbal stimuli follows commands  Resolved Hospital Problem list   Possible cerebral infarct Atrial fibrillation with RVR Pneumothorax  Assessment & Plan:   Patient is an 85 year old woman with with history of recent CABG with complicated postoperative course.  She is being treated for:  Acute hypoxemic respiratory failure -Multifactorial most likely secondary to post  pericardotomy syndrome -Continue to maintain net even to slightly negative fluid balance  -On minimal ventilation settings, SBT and extubation planned for today. Prior to extubation will need to discuss ongoing plans with the family regarding tracheostomy if that would be consistent with patient's wishes. Palliative care consultation   Post Pericardotomy Syndrome  -she is on  colchicine and prednisone taper.  Typical prednisone course for this syndrome is 0.2 to 0.5 milligrams per gram over 2 to 4 weeks for refractory symptoms.  She is unable to tolerate NSAIDs due to renal function. She is  currently on 0.5 mg/kg with taper. Given persistent symptoms and cardiology input, will increase to 1mg /kg prednisone for now.   Atrial fibrillation with RVR -Post synchronized cardioversion at the bedside 11/2 -Maintain heparin drip - increase metoprolol to 25 mg BID today. -Amiodarone is held for some concern for amiodarone pulmonary toxicity.  Now that her effusions are resolved could consider repeat CT chest to further characterize the lung parenchyma after extubation.   Bilateral pleural effusions -Status post chest tube placement x3.  Maintain all of these to wall suction until extubated. -Studies consistent with lymphocytic inflammatory effusion consistent with post pericardiotomy syndrome  Best practice:  Diet: NPO - NG to LWS for ileus, will resume TF if not excubating Pain/Anxiety/Delirium protocol (if indicated): on fentanyl, use precedex second line. Scheduled tylenol VAP protocol (if indicated): Ordered DVT prophylaxis: heparin therapeutic GI prophylaxis: PPI Glucose control: BG<180 Foley: Continue Foley catheter.  Place external female catheter Mobility: OOB with assist.  We will consult PT Code Status: DNR-CCA - no chest compressions.  Family Communication: updated at the bedside today Disposition: needs ICU  Labs   CBC: Recent Labs  Lab 09/01/19 0326 09/02/19 0356 09/03/19 0648 09/04/19 0500 09/05/19 0339  WBC 11.5* 10.3 16.2* 16.0* 27.0*  HGB 8.3* 8.7* 9.5* 8.5* 7.7*  HCT 26.8* 28.8* 32.1* 28.7* 26.2*  MCV 70.3* 70.4* 72.3* 72.7* 73.2*  PLT 300 414* 473* 412* 523*    Basic Metabolic Panel: Recent Labs  Lab 09/01/19 0326 09/02/19 0356 09/03/19 0648 09/04/19 0500 09/05/19 0339  NA 132* 136 136 138 139  K 3.8 3.5 3.6 3.4* 4.0  CL 91* 98 99 100 104  CO2 25 27 24 27 23   GLUCOSE 98 120* 128* 110* 105*  BUN 76* 80* 91* 87* 109*  CREATININE 1.14* 1.12* 1.18* 1.11* 1.67*  CALCIUM 9.7 9.4 9.8 9.6 9.4   GFR: Estimated Creatinine Clearance: 20.9 mL/min  (A) (by C-G formula based on SCr of 1.67 mg/dL (H)). Recent Labs  Lab 09/02/19 0356 09/03/19 0648 09/04/19 0500 09/05/19 0339  WBC 10.3 16.2* 16.0* 27.0*    Liver Function Tests: No results for input(s): AST, ALT, ALKPHOS, BILITOT, PROT, ALBUMIN in the last 168 hours. No results for input(s): LIPASE, AMYLASE in the last 168 hours. No results for input(s): AMMONIA in the last 168 hours.  ABG    Component Value Date/Time   PHART 7.443 08/26/2019 1305   PCO2ART 29.5 (L) 08/26/2019 1305   PO2ART 213.0 (H) 08/26/2019 1305   HCO3 20.2 08/26/2019 1305   TCO2 21 (L) 08/26/2019 1305   ACIDBASEDEF 3.0 (H) 08/26/2019 1305   O2SAT 86.4 09/04/2019 0823     HbA1C: Hgb A1c MFr Bld  Date/Time Value Ref Range Status  08/26/2019 04:41 AM 5.5 4.8 - 5.6 % Final    Comment:    (NOTE) Pre diabetes:          5.7%-6.4% Diabetes:              >6.4% Glycemic control for   <7.0% adults with diabetes   07/16/2019 03:48 AM 5.5 4.8 - 5.6 % Final    Comment:    (NOTE)         Prediabetes: 5.7 - 6.4         Diabetes: >6.4  Glycemic control for adults with diabetes: <7.0     CBG: Recent Labs  Lab 09/04/19 1554 09/04/19 1942 09/04/19 2329 09/05/19 0334 09/05/19 0740  GLUCAP 126* 129* 121* 97 108*    Critical care time: 48 min   The patient is critically ill with multiple organ systems failure and requires high complexity decision making for assessment and support, frequent evaluation and titration of therapies, application of advanced monitoring technologies and extensive interpretation of multiple databases.   Critical Care Time devoted to patient care services described in this note is 48 minutes. This time reflects time of care of this [place] . This critical care time does not reflect separately billable procedures or procedure time, teaching time or supervisory time of PA/NP/Med student/Med Resident etc but could involve care discussion time.  [facility] 09/05/2019 8:41 AM  Pager: [phone] After hours pager: [phone]

## 2019-09-06 ENCOUNTER — Inpatient Hospital Stay (HOSPITAL_COMMUNITY): Payer: Medicare Other

## 2019-09-06 DIAGNOSIS — N179 Acute kidney failure, unspecified: Secondary | ICD-10-CM

## 2019-09-06 DIAGNOSIS — R579 Shock, unspecified: Secondary | ICD-10-CM

## 2019-09-06 LAB — PREPARE RBC (CROSSMATCH)

## 2019-09-06 LAB — GLUCOSE, CAPILLARY
Glucose-Capillary: 65 mg/dL — ABNORMAL LOW (ref 70–99)
Glucose-Capillary: 86 mg/dL (ref 70–99)
Glucose-Capillary: 128 mg/dL — ABNORMAL HIGH (ref 70–99)
Glucose-Capillary: 58 mg/dL — ABNORMAL LOW (ref 70–99)
Glucose-Capillary: 134 mg/dL — ABNORMAL HIGH (ref 70–99)
Glucose-Capillary: 28 mg/dL — CL (ref 70–99)
Glucose-Capillary: 44 mg/dL — CL (ref 70–99)

## 2019-09-06 LAB — HEPARIN LEVEL (UNFRACTIONATED): Heparin Unfractionated: 0.4 IU/mL (ref 0.30–0.70)

## 2019-09-06 LAB — POCT I-STAT 7, (LYTES, BLD GAS, ICA,H+H)
Acid-base deficit: 8 mmol/L — ABNORMAL HIGH (ref 0.0–2.0)
Bicarbonate: 17 mmol/L — ABNORMAL LOW (ref 20.0–28.0)
Calcium, Ion: 1.15 mmol/L (ref 1.15–1.40)
HCT: 23 % — ABNORMAL LOW (ref 36.0–46.0)
Hemoglobin: 7.8 g/dL — ABNORMAL LOW (ref 12.0–15.0)
O2 Saturation: 100 %
Patient temperature: 99.3
Potassium: 4.7 mmol/L (ref 3.5–5.1)
Sodium: 136 mmol/L (ref 135–145)
TCO2: 18 mmol/L — ABNORMAL LOW (ref 22–32)
pCO2 arterial: 32.8 mmHg (ref 32.0–48.0)
pH, Arterial: 7.325 — ABNORMAL LOW (ref 7.350–7.450)
pO2, Arterial: 286 mmHg — ABNORMAL HIGH (ref 83.0–108.0)

## 2019-09-06 LAB — BASIC METABOLIC PANEL
Anion gap: 20 — ABNORMAL HIGH (ref 5–15)
BUN: 125 mg/dL — ABNORMAL HIGH (ref 8–23)
CO2: 14 mmol/L — ABNORMAL LOW (ref 22–32)
Calcium: 8.1 mg/dL — ABNORMAL LOW (ref 8.9–10.3)
Chloride: 106 mmol/L (ref 98–111)
Creatinine, Ser: 2.81 mg/dL — ABNORMAL HIGH (ref 0.44–1.00)
GFR calc Af Amer: 17 mL/min — ABNORMAL LOW (ref >60)
GFR calc non Af Amer: 15 mL/min — ABNORMAL LOW (ref >60)
Glucose, Bld: 136 mg/dL — ABNORMAL HIGH (ref 70–99)
Potassium: 5.3 mmol/L — ABNORMAL HIGH (ref 3.5–5.1)
Sodium: 140 mmol/L (ref 135–145)

## 2019-09-06 LAB — CBC
HCT: 21.5 % — ABNORMAL LOW (ref 36.0–46.0)
Hemoglobin: 6.1 g/dL — CL (ref 12.0–15.0)
MCH: 21.5 pg — ABNORMAL LOW (ref 26.0–34.0)
MCHC: 28.4 g/dL — ABNORMAL LOW (ref 30.0–36.0)
MCV: 75.7 fL — ABNORMAL LOW (ref 80.0–100.0)
Platelets: 482 10*3/uL — ABNORMAL HIGH (ref 150–400)
RBC: 2.84 MIL/uL — ABNORMAL LOW (ref 3.87–5.11)
RDW: 17.9 % — ABNORMAL HIGH (ref 11.5–15.5)
WBC: 31.7 10*3/uL — ABNORMAL HIGH (ref 4.0–10.5)
nRBC: 0.3 % — ABNORMAL HIGH (ref 0.0–0.2)

## 2019-09-06 MED ORDER — SODIUM CHLORIDE 0.9 % IV SOLN
1.0000 g | INTRAVENOUS | Status: DC
  Filled 2019-09-06: qty 1

## 2019-09-06 MED ORDER — SODIUM CHLORIDE 0.9% IV SOLUTION
Freq: Once | INTRAVENOUS | Status: AC
  Administered 2019-09-06: 08:00:00 via INTRAVENOUS

## 2019-09-06 MED ORDER — SODIUM CHLORIDE 0.9 % IV BOLUS
1000.0000 mL | Freq: Once | INTRAVENOUS | Status: AC
  Administered 2019-09-06: 1000 mL via INTRAVENOUS

## 2019-09-06 MED ORDER — DEXTROSE 50 % IV SOLN
INTRAVENOUS | Status: AC
  Administered 2019-09-06: 50 mL
  Filled 2019-09-06: qty 50

## 2019-09-06 MED ORDER — DEXTROSE 50 % IV SOLN
INTRAVENOUS | Status: AC
  Administered 2019-09-06: 25 mL
  Filled 2019-09-06: qty 50

## 2019-09-07 LAB — TYPE AND SCREEN
ABO/RH(D): O POS
Antibody Screen: NEGATIVE
Unit division: 0

## 2019-09-07 LAB — BPAM RBC
Blood Product Expiration Date: 202011112359
ISSUE DATE / TIME: 202011040638
Unit Type and Rh: 5100

## 2019-09-11 ENCOUNTER — Telehealth: Payer: Self-pay | Admitting: *Deleted

## 2019-09-11 NOTE — Telephone Encounter (Signed)
Received original D/C by mail-D/C forwarded to Dr.Yacoub for signature .(22M)

## 2019-09-13 NOTE — Telephone Encounter (Signed)
Received original signed D/C-Nikia from [facility] notified for pick up .

## 2019-09-27 ENCOUNTER — Other Ambulatory Visit: Payer: Medicare Other

## 2019-10-03 ENCOUNTER — Ambulatory Visit: Payer: Medicare Other | Admitting: Physician Assistant

## 2019-10-03 NOTE — Progress Notes (Addendum)
Pt continues to be hypoglycemic w/ CBG of 24 this AM. Amp of D50 given, will recheck.  Relayed to CCM NP.

## 2019-10-03 NOTE — Progress Notes (Signed)
Patient ID: Lacey Coleman, female   DOB: 09/24/1931, 85 y.o.   MRN: [id number]    Advanced Heart Failure Rounding Note   Subjective:    Much worse today. Remains on vent with labored breathing. Remains hypotensive on max dose NE. Bleeding from CTs. Hgb 6.1. Anuric. Creatinine nearly 3.   Objective:   Weight Range:  Vital Signs:   Temp:  [98.1 F (36.7 C)-100.9 F (38.3 C)] 100 F (37.8 C) (11/04 0853) Pulse Rate:  [39-119] 84 (11/04 0853) Resp:  [13-38] 33 (11/04 0853) BP: (36-148)/(16-98) 90/47 (11/04 0853) SpO2:  [73 %-100 %] 99 % (11/04 0853) FiO2 (%):  [40 %-100 %] 70 % (11/04 0853) Weight:  [63.2 kg] 63.2 kg (11/04 0454) Last BM Date: 09/05/19  Weight change: Filed Weights   09/04/19 0500 09/05/19 0500 09-26-19 0454  Weight: 63.9 kg 63.2 kg 63.2 kg    Intake/Output:   Intake/Output Summary (Last 24 hours) at 09-26-19 0911 Last data filed at 09/26/19 0853 Gross per 24 hour  Intake 3801.42 ml  Output 402 ml  Net 3399.42 ml     Physical Exam: CVP 4 General:   Pale Critically ill appearing on vent with labored breathing. Nonresponsive HEENT: normal +ETT Neck: supple. no JVD. Carotids 2+ bilat; no bruits. No lymphadenopathy or thryomegaly appreciated. Cor: PMI nondisplaced. Regular rate & rhythm. No rubs, gallops or murmurs. Lungs: coarse +CT x3 Abdomen: soft, nontender, nondistended. No hepatosplenomegaly. No bruits or masses. Good bowel sounds. Extremities: no cyanosis, clubbing, rash, edema Neuro: unresponsive on vent    Telemetry: Sinus 70-80s Personally reviewed    Labs: Basic Metabolic Panel: Recent Labs  Lab 09/02/19 0356 09/03/19 0648 09/04/19 0500 09/05/19 0339 09-26-2019 0131 09/26/2019 0508  NA 136 136 138 139 136 140  K 3.5 3.6 3.4* 4.0 4.7 5.3*  CL 98 99 100 104  --  106  CO2 _0 --  14*  GLUCOSE 120* 128* 110* 105*  --  136*  BUN 80* 91* 87* 109*  --  125*  CREATININE 1.12* 1.18* 1.11* 1.67*  --  2.81*  CALCIUM 9.4 9.8 9.6  9.4  --  8.1*    Liver Function Tests: No results for input(s): AST, ALT, ALKPHOS, BILITOT, PROT, ALBUMIN in the last 168 hours. No results for input(s): LIPASE, AMYLASE in the last 168 hours. No results for input(s): AMMONIA in the last 168 hours.  CBC: Recent Labs  Lab 09/02/19 0356 09/03/19 0648 09/04/19 0500 09/05/19 0339 2019-09-26 0131 2019/09/26 0508  WBC 10.3 16.2* 16.0* 27.0*  --  31.7*  HGB 8.7* 9.5* 8.5* 7.7* 7.8* 6.1*  HCT 28.8* 32.1* 28.7* 26.2* 23.0* 21.5*  MCV 70.4* 72.3* 72.7* 73.2*  --  75.7*  PLT 414* 473* 412* 523*  --  482*    Cardiac Enzymes: No results for input(s): CKTOTAL, CKMB, CKMBINDEX, TROPONINI in the last 168 hours.  BNP: BNP (last 3 results) Recent Labs    07/16/19 0141 08/26/19 0441 08/31/19 0251  BNP 2,027.0* 497.7* 275.5*    ProBNP (last 3 results) No results for input(s): PROBNP in the last 8760 hours.    Other results:  Imaging: Dg Chest Port 1 View  Result Date: 2019/09/26 CLINICAL DATA:  Pneumothorax EXAM: PORTABLE CHEST 1 VIEW COMPARISON:  September 05, 2019 FINDINGS: The right-sided PICC line is stable in positioning. The endotracheal tube terminates above the carina. There are bilateral chest tubes in place. There is no significant right-sided pneumothorax. No left-sided pneumothorax. Persistent bilateral pleural  effusions and bibasilar airspace disease is again noted. The heart size remains enlarged. The patient is status post prior median sternotomy. Aortic calcifications are noted. IMPRESSION: 1. Lines and tubes as above. 2. No pneumothorax. 3. Persistent bibasilar airspace disease and small bilateral pleural effusions. Electronically Signed   By: Constance Holster M.D.   On: 01-Oct-2019 06:10   Dg Chest Port 1 View  Result Date: 09/05/2019 CLINICAL DATA:  Pleural effusion EXAM: PORTABLE CHEST 1 VIEW COMPARISON:  September 04, 2019 FINDINGS: There is a trace right sided pneumothorax, significantly improved from prior study. There  is no significant left-sided pneumothorax. Bilateral chest tubes are in place. The right-sided PICC line is stable in positioning. The endotracheal tube terminates above the carina. There are persistent small bilateral pleural effusions. There are persistent bibasilar airspace opacities. IMPRESSION: 1. Lines and tubes as above. 2. Trace right-sided pneumothorax, improved from prior study. No evidence for a significant left-sided pneumothorax. 3. Small bilateral pleural effusions. 4. Persistent bibasilar airspace opacities. Electronically Signed   By: Constance Holster M.D.   On: 09/05/2019 05:43     Medications:     Scheduled Medications: . acetaminophen (TYLENOL) oral liquid 160 mg/5 mL  650 mg Per Tube BID  . aspirin  81 mg Per Tube Daily  . atorvastatin  40 mg Oral q1800  . chlorhexidine gluconate (MEDLINE KIT)  15 mL Mouth Rinse BID  . Chlorhexidine Gluconate Cloth  6 each Topical Daily  . colchicine  0.6 mg Oral Daily  . famotidine  20 mg Per Tube Daily  . insulin aspart  0-9 Units Subcutaneous Q4H  . mouth rinse  15 mL Mouth Rinse 10 times per day  . predniSONE  60 mg Oral Q breakfast  . sodium chloride flush  10-40 mL Intracatheter Q12H    Infusions: . sodium chloride 20 mL/hr at Oct 01, 2019 0700  . ceFEPime (MAXIPIME) IV    . dexmedetomidine (PRECEDEX) IV infusion Stopped (October 01, 2019 0904)  . feeding supplement (VITAL AF 1.2 CAL) 45 mL/hr at 09/03/19 0600  . fentaNYL infusion INTRAVENOUS Stopped (10/01/2019 0904)  . norepinephrine (LEVOPHED) Adult infusion 60 mcg/min (October 01, 2019 0744)    PRN Medications: acetaminophen, fentaNYL (SUBLIMAZE) injection, ipratropium-albuterol, ondansetron (ZOFRAN) IV, sodium chloride flush   Assessment/Plan:    1. Acute Hypoxic Respiratory Failure 2. Shock, multifactorial possibly septic 3. Pleural effusions 4. Paroxysmal AFL with RVR 5. AKI 6. Acute blood loss anemia  She continues to deteriorate now with refractory shock and MSOF. There is  no hope for meaningful survival. D/w CCM and Palliative at bedside and agree that only option is shift to Comfort Care.    CRITICAL CARE Performed by: Glori Bickers  Total critical care time: 35 minutes  Critical care time was exclusive of separately billable procedures and treating other patients.  Critical care was necessary to treat or prevent imminent or life-threatening deterioration.  Critical care was time spent personally by me (independent of midlevel providers or residents) on the following activities: development of treatment plan with patient and/or surrogate as well as nursing, discussions with consultants, evaluation of patient's response to treatment, examination of patient, obtaining history from patient or surrogate, ordering and performing treatments and interventions, ordering and review of laboratory studies, ordering and review of radiographic studies, pulse oximetry and re-evaluation of patient's condition.    Length of Stay: 11   Glori Bickers MD 10/01/19, 9:11 AM  Advanced Heart Failure Team Pager [phone] (M-F; [place])  Please contact [facility] for night-coverage after hours (  4p -7a ) and weekends on [email or web address]

## 2019-10-03 NOTE — Progress Notes (Signed)
Spoke w/ pts dtr Darla to update on pts decline in status overnight. Darla states will be at hospital btwn 08-1029 to speak w/ Dr. Nelda Marseille. Relayed to CCM MD.  Palliative Care paged.

## 2019-10-03 NOTE — Progress Notes (Signed)
CRITICAL VALUE ALERT  Critical Value:  Hgb 6.1  Date & Time Notied:  2019/09/17 @ 06:13  Provider Notified: Billy Coast, MD  Orders Received/Actions taken: Transfuse 1 unit RBCs

## 2019-10-03 NOTE — Progress Notes (Signed)
183ml of fentanyl from bag wasted in sink. Marcha Solders, RN witnessed.

## 2019-10-03 NOTE — Death Summary Note (Signed)
DEATH SUMMARY   Patient Details  Name: Lacey Coleman MRN: [id number] DOB: 07/05/1931  Admission/Discharge Information   Admit Date:  Mar 03, 2019  Date of Death: Date of Death: 09/28/2019  Time of Death: Time of Death: 1005  Length of Stay: 11  Referring Physician: Patient, No Pcp Per   Reason(s) for Hospitalization  Acute hypoxemic respiratory failure  Diagnoses  Preliminary cause of death:   Acute hypoxemic respiratory failure  Secondary Diagnoses (including complications and co-morbidities):  Active Problems:   S/P CABG (coronary artery bypass graft)   AMS (altered mental status)   Pressure injury of skin   Cerebral thrombosis with cerebral infarction   Palliative care encounter Acute encephalopathy Circulatory shock  Brief Hospital Course (including significant findings, care, treatment, and services provided and events leading to death)  85 yo F PMH CAD s/p CABG in 08/2019, CHF, HTN, RA,A fib (eliquis dc 1 week ago)who presents 10/23 with AMS. Patient was found unresponsive and hypoxemic.  She did have a subacute infarct on MRI.  Hospital course has been complicated by bilateral pleural effusion with chest tube placement.  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  She has had intermittent hypotension requiring norepinephrine as well as atrial fibrillation with RVR.  There is also concern for acute amiodarone pulmonary toxicity.    Patient was extubated and continued to decline.  Palliative care was consulted and Yong ChannelAlicia Parker, NP from palliative care was present bedside with the family.  It was clear to all parties involved that the patient is declining and is actively dying.  While family and palliative care were present decision was made to pursue comfort care and the patient was was given fentanyl for comfort to expire shortly thereafter with the family bedside.  Appreciate input from palliative care  NP.      Pertinent Labs and Studies  Significant Diagnostic Studies Dg Chest 1 View  Result Date: 09/01/2019 CLINICAL DATA:  Altered mental status.  Right pleural effusion. EXAM: CHEST  1 VIEW COMPARISON:  August 31, 2019. FINDINGS: Stable cardiomediastinal silhouette. Endotracheal and nasogastric tubes are unchanged in position. Status post coronary bypass graft. Bilateral chest tubes are noted without pneumothorax. Mild bibasilar subsegmental atelectasis is noted with small pleural effusions. Bony thorax is unremarkable. IMPRESSION: Stable support apparatus. Interval placement of bilateral chest tubes without pneumothorax. Bibasilar opacities noted on prior exam are significantly improved. Electronically Signed   By: Lupita RaiderJames  Green Jr M.D.   On: 09/01/2019 12:04   Dg Chest 2 View  Result Date: 08/18/2019 CLINICAL DATA:  CABG. EXAM: CHEST - 2 VIEW COMPARISON:  Chest x-ray 08/07/2019. FINDINGS: Prior CABG. Cardiomegaly with normal pulmonary vascularity. Progressive right base atelectasis. Persistent unchanged left base atelectasis/infiltrate. Bilateral moderate pleural effusion again noted without interim change. No pneumothorax. No acute bony abnormality. IMPRESSION: 1.  Prior CABG.  Cardiomegaly with normal pulmonary vascularity. 2. Progressive right base atelectasis. Persistent unchanged left base atelectasis/infiltrate. Moderate bilateral pleural effusions again noted without interim change. Electronically Signed   By: Maisie Fushomas  Register   On: 08/18/2019 15:23   Ct Head Wo Contrast  Result Date: 08/26/2019 CLINICAL DATA:  85 year old female with altered mental status. EXAM: CT HEAD WITHOUT CONTRAST TECHNIQUE: Contiguous axial images were obtained from the base of the skull through the vertex without intravenous contrast. COMPARISON:  Head CT dated 08/07/2019 FINDINGS: Brain: There is mild age-related atrophy and chronic microvascular ischemic changes. Bilateral basal ganglial old lacunar  infarcts noted. Left frontal  white matter hypodense focus (series 3 image 23), right frontal small hypodensity (series 3, image 23 appear new since the prior CT of 08/07/2019 and concerning for areas of subacute or acute infarct. An embolic infarct is not excluded. Clinical correlation is recommended. MRI may provide better evaluation. There is no acute intracranial hemorrhage. No mass effect or midline shift. No extra-axial fluid collection. Vascular: No hyperdense vessel or unexpected calcification. Skull: Normal. Negative for fracture or focal lesion. Sinuses/Orbits: No acute finding. Other: A support tube is partially visualized. IMPRESSION: 1. No acute intracranial hemorrhage. 2. Mild age-related atrophy and chronic microvascular ischemic changes. 3. Bilateral frontal white matter hypodense lesions, new since the prior CT of 08/07/2019 and concerning for areas of subacute or acute infarct. Clinical correlation is recommended. MRI may provide better evaluation if clinically indicated. These results were called by telephone at the time of interpretation on 08/26/2019 at 1:28 am to provider [facility] , who verbally acknowledged these results. Electronically Signed   By: Elgie Collard M.D.   On: 08/26/2019 01:32   Ct Chest Wo Contrast  Result Date: 08/31/2019 CLINICAL DATA:  Interstitial edema and bilateral pleural effusions. Amiodarone therapy. CABG on 07/19/2019. EXAM: CT CHEST WITHOUT CONTRAST TECHNIQUE: Multidetector CT imaging of the chest was performed following the standard protocol without IV contrast. COMPARISON:  CT scan of the chest 07/17/2019 FINDINGS: Cardiovascular: The heart is normal in size. Extensive coronary artery calcifications. CABG. No pericardial effusion. Enlarged left atrium, but diminished in size since the prior CT scan. Aortic atherosclerosis. The pulmonary vascularity appears normal. Mediastinum/Nodes: Endotracheal tube in good position. NG tube tip below the diaphragm.  Lungs/Pleura: The patient has large bilateral pleural effusions, markedly increased since the prior study. There is compressive complete atelectasis of both lower lobes. There small peripheral areas of infiltrate in both upper lobes. The pattern is not typical for pulmonary edema. There is no appreciable interstitial edema. Upper Abdomen: 2.7 cm low-density exophytic lesion on the upper pole of the left kidney, likely a cyst. No other abnormality of the upper abdomen. Musculoskeletal: No acute bone abnormality. Recent median sternotomy. IMPRESSION: 1. Progressive large bilateral pleural effusions with compressive complete atelectasis of both lower lobes. 2. Small peripheral areas of infiltrate in both upper lobes. The pattern is not typical for pulmonary edema. Amiodarone toxicity as a variety of patterns and could be responsible for the patchy infiltrates but the effusions would be atypical. Aortic Atherosclerosis (ICD10-I70.0). Electronically Signed   By: Francene Boyers M.D.   On: 08/31/2019 16:22   Mr Brain Wo Contrast  Result Date: 08/26/2019 CLINICAL DATA:  Initial evaluation for acute unresponsiveness. EXAM: MRI HEAD WITHOUT CONTRAST TECHNIQUE: Multiplanar, multiecho pulse sequences of the brain and surrounding structures were obtained without intravenous contrast. COMPARISON:  Prior CT from earlier the same day. FINDINGS: Brain: Generalized age-related cerebral atrophy. Mild patchy T2/FLAIR hyperintensity within the periventricular deep white matter both cerebral hemispheres most consistent with chronic small vessel ischemic disease, mild in nature. Few scatter remote lacunar infarct present within the bilateral basal ganglia. 18 mm focus of diffusion abnormality involving the subcortical left frontal lobe most consistent with evolving late subacute ischemic infarct (series 5, image 78). No associated hemorrhage or mass effect. No other evidence for acute or subacute ischemia. Gray-white matter  differentiation otherwise maintained with no other chronic or remote cortical infarction. No acute intracranial hemorrhage. Few scattered chronic micro hemorrhages noted. No mass lesion, midline shift or mass effect. No hydrocephalus. No extra-axial fluid collection. Pituitary gland within  normal limits for suprasellar region normal. Midline structures intact. Vascular: Major intracranial vascular flow voids are maintained. Skull and upper cervical spine: Craniocervical junction within normal limits. Multilevel degenerative spondylosis noted within the upper cervical spine with resultant mild diffuse spinal stenosis. Bone marrow signal intensity within normal limits. No scalp soft tissue abnormality. Sinuses/Orbits: Globes orbital soft tissues within normal limits. Paranasal sinuses are clear. No mastoid effusion. Inner ear structures grossly normal. Other: None. IMPRESSION: 1. 18 mm focus of diffusion abnormality involving the subcortical left frontal lobe, most consistent with evolving late subacute ischemic infarct. No associated hemorrhage or mass effect. 2. No other acute intracranial abnormality. 3. Underlying atrophy with mild chronic microvascular ischemic disease. Electronically Signed   By: Jeannine Boga M.D.   On: 08/26/2019 07:08   Dg Chest Port 1 View  Result Date: 2019-09-27 CLINICAL DATA:  Pneumothorax EXAM: PORTABLE CHEST 1 VIEW COMPARISON:  September 05, 2019 FINDINGS: The right-sided PICC line is stable in positioning. The endotracheal tube terminates above the carina. There are bilateral chest tubes in place. There is no significant right-sided pneumothorax. No left-sided pneumothorax. Persistent bilateral pleural effusions and bibasilar airspace disease is again noted. The heart size remains enlarged. The patient is status post prior median sternotomy. Aortic calcifications are noted. IMPRESSION: 1. Lines and tubes as above. 2. No pneumothorax. 3. Persistent bibasilar airspace disease  and small bilateral pleural effusions. Electronically Signed   By: Constance Holster M.D.   On: 09-27-19 06:10   Dg Chest Port 1 View  Result Date: 09/05/2019 CLINICAL DATA:  Pleural effusion EXAM: PORTABLE CHEST 1 VIEW COMPARISON:  September 04, 2019 FINDINGS: There is a trace right sided pneumothorax, significantly improved from prior study. There is no significant left-sided pneumothorax. Bilateral chest tubes are in place. The right-sided PICC line is stable in positioning. The endotracheal tube terminates above the carina. There are persistent small bilateral pleural effusions. There are persistent bibasilar airspace opacities. IMPRESSION: 1. Lines and tubes as above. 2. Trace right-sided pneumothorax, improved from prior study. No evidence for a significant left-sided pneumothorax. 3. Small bilateral pleural effusions. 4. Persistent bibasilar airspace opacities. Electronically Signed   By: Constance Holster M.D.   On: 09/05/2019 05:43   Dg Chest Port 1 View  Result Date: 09/04/2019 CLINICAL DATA:  Mechanical ventilation EXAM: PORTABLE CHEST 1 VIEW COMPARISON:  September 03, 2019 FINDINGS: The endotracheal tube terminates above the carina. The right-sided PICC line is well position. Bilateral chest tubes are again noted. There is a trace left apical pneumothorax. There is a small right-sided pneumothorax. The right-sided pigtail catheter has retracted slightly from prior study. Heart size is stable. There is persistent blunting of the costophrenic angles bilaterally and small bibasilar airspace opacities. IMPRESSION: 1. Lines and tubes as above. 2. Trace bilateral pneumothoraces, stable from prior study. 3. Otherwise, stable appearance of the chest. Electronically Signed   By: Constance Holster M.D.   On: 09/04/2019 05:40   Dg Chest Port 1 View  Result Date: 09/03/2019 CLINICAL DATA:  Mechanically assisted ventilation. EXAM: PORTABLE CHEST 1 VIEW COMPARISON:  09/01/2019 and subsequent chest  radiograph obtained today. FINDINGS: Interval proximally 65% right pneumothorax without mediastinal shift. Interval less than 5% left apical pneumothorax. Interval loculated normal thoraces or air in dilated, tortuous esophagus and gastric fundus in the left lower lung zone, described in the subsequent chest radiograph report. Right PICC tip in the mid superior vena cava. Stable post CABG changes, normal sized heart. Endotracheal tube tip 3.3 cm above the carina.  This could be retracted 3.2 cm. The lung volumes remain hyperexpanded with mildly prominent interstitial markings and minimal atelectasis on the left. There is also minimal patchy opacity in the left mid and upper lung zones, not previously present. IMPRESSION: 1. Interval approximately 65% right pneumothorax without mediastinal shift. This was seen clinically with a subsequent 2nd right chest tube placed. 2. Interval less than 5% left apical pneumothorax. 3. Interval loculated normal thoraces or air in dilated esophagus and gastric fundus in the left lower lung zone. 4. Endotracheal tube tip 3.3 cm above the carina. This could be retracted 3.2 cm. 5. COPD. 6. Mild left basilar atelectasis and minimal patchy opacity in the left mid and upper lung zones, possibly representing developing pneumonia. This is not seen on the subsequent portable Electronically Signed   By: Beckie Salts M.D.   On: 09/03/2019 07:29   Dg Chest Port 1 View  Result Date: 09/03/2019 CLINICAL DATA:  Follow-up bilateral pneumothoraces EXAM: PORTABLE CHEST 1 VIEW COMPARISON:  Earlier today. Chest CT dated 08/31/2019. FINDINGS: Endotracheal tube tip 2.4 cm above the carina. This could be retracted 3.8 cm for better positioning. Less than 5% left apical pneumothorax with no significant change. Less than 5% right apical pneumothorax, significantly decreased in size. Bilateral chest tubes remain in place. A 2nd small caliber chest tube has been placed on the right. An elongated oval  collection of air is again demonstrated medially in the left mid and lower lung zone. This may represent air within a dilated esophagus, previously shown to extend into this region on the CT. Loculated pleural air is also possible. A similar area is again demonstrated at the left lung base, possibly representing loculated pleural air or gas in the gastric fundus. Stable normal sized heart, tortuous calcified thoracic aorta and post CABG changes. The lungs remain hyperexpanded. A small left pleural effusion is noted. Right PICC tip in the superior vena cava. Diffuse osteopenia. IMPRESSION: 1. Less than 5% right apical pneumothorax, significantly decreased in size. 2. Stable less than 5% left apical pneumothorax. 3. Interval small caliber right chest tube in addition to previously demonstrated bilateral chest tubes. 4. Stable oval collections of air in the left lower hemithorax, possibly representing intraluminal air or loculated pneumothoraces, as described above. 5. Stable changes of COPD. Electronically Signed   By: Beckie Salts M.D.   On: 09/03/2019 07:24   Dg Chest Port 1 View  Result Date: 08/31/2019 CLINICAL DATA:  Respiratory failure EXAM: PORTABLE CHEST 1 VIEW COMPARISON:  August 30, 2019 FINDINGS: The mediastinal contour and cardiac silhouette are stable. Endotracheal tube is identified with distal tip 1.5 cm from carina. Nasogastric tube is identified unchanged. Consolidation of bilateral mid and lung bases are identified unchanged. Interstitial edema is identified unchanged. Moderate bilateral pleural effusions are unchanged. No pneumothorax. IMPRESSION: Persistent interstitial edema with consolidation of bilateral lungs and bilateral pleural effusions unchanged compared prior exam. Electronically Signed   By: Sherian Rein M.D.   On: 08/31/2019 07:20   Dg Chest Port 1 View  Result Date: 08/30/2019 CLINICAL DATA:  CHF and pleural effusions. EXAM: PORTABLE CHEST 1 VIEW COMPARISON:  08/28/2019  FINDINGS: Endotracheal tube remains with the tip approximately 1 cm above the carina. Gastric decompression tube extends below the diaphragm. Some improved aeration noted at both lung bases with moderate bilateral pleural effusions remaining. Mild interstitial edema remains. The heart size is stable. No pneumothorax. IMPRESSION: 1. Some improved aeration at both lung bases with moderate bilateral pleural effusions remaining. Mild  interstitial edema remains. 2. Endotracheal tube tip remains approximately 1 cm above the carina. Electronically Signed   By: Irish Lack M.D.   On: 08/30/2019 08:57   Dg Chest Port 1 View  Result Date: 08/28/2019 CLINICAL DATA:  Altered mental status which shortness-of-breath. EXAM: PORTABLE CHEST 1 VIEW COMPARISON:  08/26/2019 FINDINGS: Patient is rotated to the right. Endotracheal tube has tip 2.4 cm above the carina. Enteric tube courses into the region of the stomach and off the film as tip is not visualized. There is evidence of moderate size bilateral pleural effusions likely with associated bibasilar atelectasis as the right effusion is worse and left effusion not significantly changed. Remainder of the exam is unchanged. IMPRESSION: Continued moderate size bilateral pleural effusions with interval worsening of the right effusion and stable left effusion. Likely associated bibasilar atelectasis. Tubes and lines as described. Electronically Signed   By: Elberta Fortis M.D.   On: 08/28/2019 11:29   Dg Chest Port 1 View  Result Date: 08/26/2019 CLINICAL DATA:  Endotracheal tube EXAM: PORTABLE CHEST 1 VIEW COMPARISON:  08/26/2019, 12:35 a.m. FINDINGS: No significant change in AP portable examination with small to moderate bilateral pleural effusions and associated atelectasis or consolidation. Endotracheal tube remains in essentially unchanged position, tip approximately 3 cm above the carina. Esophagogastric tube with tip and side port below the diaphragm. Cardiomegaly  status post median sternotomy. IMPRESSION: 1. No significant change in AP portable examination with small to moderate bilateral pleural effusions and associated atelectasis or consolidation. 2. Endotracheal tube remains in essentially unchanged position, tip approximately 3 cm above the carina. Esophagogastric tube with tip and side port below the diaphragm. Electronically Signed   By: Lauralyn Primes M.D.   On: 08/26/2019 13:18   Dg Chest Portable 1 View  Result Date: 08/26/2019 CLINICAL DATA:  85 year old female status post intubation. EXAM: PORTABLE CHEST 1 VIEW COMPARISON:  Earlier radiograph dated 08/26/2019 FINDINGS: Interval placement of an endotracheal tube with tip approximately 2.5 cm above the carina. Enteric tube extends below the diaphragm with tip beyond the inferior margin of the image. Bilateral pleural effusions and associated bibasilar atelectasis although pneumonia is not excluded. No pneumothorax. Stable cardiac silhouette. Median sternotomy wires. No acute osseous pathology. IMPRESSION: 1. Interval placement of an endotracheal tube with tip above the carina. 2. Bilateral pleural effusions and associated bibasilar atelectasis/infiltrate. Electronically Signed   By: Elgie Collard M.D.   On: 08/26/2019 00:45   Dg Chest Portable 1 View  Result Date: 08/26/2019 CLINICAL DATA:  Respiratory distress EXAM: PORTABLE CHEST 1 VIEW COMPARISON:  August 18, 2019 FINDINGS: There is mild cardiomegaly. Overlying median sternotomy wires. Aortic knob calcifications. Small to moderate bilateral pleural effusions are seen, slightly worsened on the prior exam. There is hazy airspace opacity at both lung bases which could be layering effusion and/or atelectasis. No acute osseous abnormality. IMPRESSION: Small to moderate bilateral pleural effusions with adjacent hazy airspace opacity which could be layering effusion and/or atelectasis. This is slightly increased in size from the prior exam. Electronically  Signed   By: Jonna Clark M.D.   On: 08/26/2019 00:32   Dg Abd Portable 1v  Result Date: 08/26/2019 CLINICAL DATA:  NG tube placement EXAM: PORTABLE ABDOMEN - 1 VIEW COMPARISON:  None. FINDINGS: Tip the NG tube is seen projecting over the mid body of the stomach. No dilated loops of bowel are seen. Small bilateral pleural effusions. IMPRESSION: Tip of NG tube seen projecting over the mid body of the stomach. Electronically Signed  By: Jonna Clark M.D.   On: 08/26/2019 01:41   Vas [place] Lower Extremity Venous (dvt)  Result Date: 08/26/2019  Lower Venous Study Indications: Swelling.  Comparison Study: No prior study. Performing Technologist: Gertie Fey MHA, RDMS, RVT, RDCS  Examination Guidelines: A complete evaluation includes B-mode imaging, spectral Doppler, color Doppler, and power Doppler as needed of all accessible portions of each vessel. Bilateral testing is considered an integral part of a complete examination. Limited examinations for reoccurring indications may be performed as noted.  +---------+---------------+---------+-----------+----------+--------------+ RIGHT    CompressibilityPhasicitySpontaneityPropertiesThrombus Aging +---------+---------------+---------+-----------+----------+--------------+ CFV                                                   Not visualized +---------+---------------+---------+-----------+----------+--------------+ SFJ                                                   Not visualized +---------+---------------+---------+-----------+----------+--------------+ FV Prox  Full                                                        +---------+---------------+---------+-----------+----------+--------------+ FV Mid   Full                                                        +---------+---------------+---------+-----------+----------+--------------+ FV DistalFull                                                         +---------+---------------+---------+-----------+----------+--------------+ PFV      Full                                                        +---------+---------------+---------+-----------+----------+--------------+ POP      Full           No       Yes                                 +---------+---------------+---------+-----------+----------+--------------+ PTV      Full                                                        +---------+---------------+---------+-----------+----------+--------------+ PERO     Full                                                        +---------+---------------+---------+-----------+----------+--------------+  +---------+---------------+---------+-----------+----------+--------------+  LEFT     CompressibilityPhasicitySpontaneityPropertiesThrombus Aging +---------+---------------+---------+-----------+----------+--------------+ CFV      Full           No       Yes                                 +---------+---------------+---------+-----------+----------+--------------+ SFJ      Full                                                        +---------+---------------+---------+-----------+----------+--------------+ FV Prox  Full                                                        +---------+---------------+---------+-----------+----------+--------------+ FV Mid   Full                                                        +---------+---------------+---------+-----------+----------+--------------+ FV DistalFull                                                        +---------+---------------+---------+-----------+----------+--------------+ PFV      Full                                                        +---------+---------------+---------+-----------+----------+--------------+ POP      Full           No       Yes                                  +---------+---------------+---------+-----------+----------+--------------+ PTV      Full                                                        +---------+---------------+---------+-----------+----------+--------------+ PERO     Full                                                        +---------+---------------+---------+-----------+----------+--------------+  Summary: Right: There is no evidence of deep vein thrombosis in the lower extremity. However, portions of this examination were limited- see technologist comments above. A cystic structure is found in the popliteal fossa. Left: There is no evidence of deep vein thrombosis in  the lower extremity. No cystic structure found in the popliteal fossa.  Pulsatile lower extremity venous flow is suggestive of possible elevated right heart pressure. *See table(s) above for measurements and observations. Electronically signed by Sherald Hess MD on 08/26/2019 at 1:26:25 PM.    Final    Vas [place] Transcranial Doppler  Result Date: 08/28/2019  Transcranial Doppler Indications: Stroke. Limitations for diagnostic windows: Unable to insonate right transtemporal window. Unable to insonate left transtemporal window. Comparison Study: No prior. Performing Technologist: Marilynne Halsted RDMS, RVT  Examination Guidelines: A complete evaluation includes B-mode imaging, spectral Doppler, color Doppler, and power Doppler as needed of all accessible portions of each vessel. Bilateral testing is considered an integral part of a complete examination. Limited examinations for reoccurring indications may be performed as noted.  +----------+-------------+----------+-----------+-------+ RIGHT TCD Right VM (cm)Depth (cm)PulsatilityComment +----------+-------------+----------+-----------+-------+ Opthalmic     19.00                                 +----------+-------------+----------+-----------+-------+ ICA siphon    34.00                                  +----------+-------------+----------+-----------+-------+ Vertebral    -22.00                                 +----------+-------------+----------+-----------+-------+  +----------+------------+----------+-----------+-------+ LEFT TCD  Left VM (cm)Depth (cm)PulsatilityComment +----------+------------+----------+-----------+-------+ Opthalmic    24.00                                 +----------+------------+----------+-----------+-------+ ICA siphon   63.00                                 +----------+------------+----------+-----------+-------+ Vertebral    -21.00                                +----------+------------+----------+-----------+-------+  +------------+-------+------------+             VM cm/s  Comment    +------------+-------+------------+ Prox Basilar       no insonated +------------+-------+------------+ Summary:  Absent bitemporal and ppor suboccipital window limits evaluation. Normal blood flow directions in both opthalmics,carotid siphons and vertebral arteries. *See table(s) above for measurements and observations.  Diagnosing physician: Delia Heady MD Electronically signed by Delia Heady MD on 08/28/2019 at 3:13:12 PM.    Final    [place] Ekg Site Rite  Result Date: 09/02/2019 If Site Rite image not attached, placement could not be confirmed due to current cardiac rhythm.   Microbiology No results found for this or any previous visit (from the past 240 hour(s)).  Lab Basic Metabolic Panel: Recent Labs  Lab 09/03/2019 0131 09/16/2019 0508  NA 136 140  K 4.7 5.3*  CL  --  106  CO2  --  14*  GLUCOSE  --  136*  BUN  --  125*  CREATININE  --  2.81*  CALCIUM  --  8.1*   Liver Function Tests: No results for input(s): AST, ALT, ALKPHOS, BILITOT, PROT, ALBUMIN in the last 168 hours. No results for input(s): LIPASE, AMYLASE in the last 168 hours. No results  for input(s): AMMONIA in the last 168 hours. CBC: Recent Labs  Lab 10-Sep-2019 0131  2019-09-10 0508  WBC  --  31.7*  HGB 7.8* 6.1*  HCT 23.0* 21.5*  MCV  --  75.7*  PLT  --  482*   Cardiac Enzymes: No results for input(s): CKTOTAL, CKMB, CKMBINDEX, TROPONINI in the last 168 hours. Sepsis Labs: Recent Labs  Lab September 10, 2019 0508  WBC 31.7*    Procedures/Operations     Koren Bound 09/12/2019, 12:19 PM

## 2019-10-03 NOTE — Progress Notes (Signed)
Palliative:  HPI: 85 YO female with PMH CAD s/p recent CABG, cardiomyopathy, CHF, HTN, RA, atrial fibrillation, recent admission 9/14-9/21 with NSTEMI and acute systolic heart failure and underwent CABG admitted 08/26/2019 with respiratory failure requiring ventilator support and chest tubes. HR/atrial fib is stabilized. She continues unable to wean from vent. Prognosis is poor. She continued to decline overnight and decompensated quickly morning of 11/4 with severe hypotension, anemia, hypoglycemia overnight. She is actively dying 11/4.   I was called by RN this morning regarding Lacey Coleman's status as she is acutely declining. RN has already called family and reported the decline and says they are on their way I did reach out to [place] to encourage them to come as soon as possible and that we will allow her siblings to visit as well. Lacey Coleman tells me that she is aware of the decline and is on her way. She does allude to feeling that not everything has been done to help her mother to improve but speaks nothing further of this. I awaited until Lacey Coleman arrives to bedside.   Lacey Coleman arrives to bedside and immediately recognizes that her mother is actively dying. Lacey Coleman is tearful and begins to tell her mother how much she loves her and giving her permission to go and be with her father in heaven. I gave Lacey Coleman privacy. Soon after daughter, Lacey Coleman, arrives and we discuss decline overnight with drop in hemoglobin and blood pressure, low blood sugars, and that her body is shutting down. I explained that there is nothing we can do to reverse this process at this time and that the best we can do is to ensure she is comfortable. They are deeply saddened but understand. Lacey Coleman does appear comfortable but very pale and with agonal breathes even on vent.   Time of death pronounced with Riley Kill, RN at 1005 am. Lacey Coleman and Lacey Coleman at bedside informed. Attempted to provide support as more family members arriving to bedside. I  gave my condolences and allowed family privacy to grieve. Chaplain available for support as well.    Exam: Patient died on vent during my visit. No heart rate auscultated and no breathes initiated by patient. Time of death 7.   Plan: - Family left at bedside to grieve. Emotional support provided. I will be available and RN to call me as needed.   [place], NP Palliative Medicine Team Pager [phone] (Please see [email or web address] for schedule) Team Phone [phone]    Greater than 50%  of this time was spent counseling and coordinating care related to the above assessment and plan

## 2019-10-03 NOTE — Progress Notes (Addendum)
eLink Physician-Brief Progress Note Patient Name: Lacey Coleman DOB: 03-May-1931 MRN: [id number]   Date of Service  2019-09-27  HPI/Events of Note  Anemia - Hgb = 6.1.   eICU Interventions  Will order: 1. Transfuse 1 unit PRBC. 2. Hold heparin IV infusion until patient re-evaluated by rounding team.      Intervention Category Major Interventions: Other:  Sommer,Steven Cornelia Copa Sep 27, 2019, 6:28 AM

## 2019-10-03 NOTE — Progress Notes (Signed)
Responded to unit page to support family.  Patient actively passing. Patient passed. Several daughters and family at bedside.  After chaplain introduction family indicated that they just wanted time to be with their love one and if they needed a chaplain they would speak with nurse. Chaplain available as needed.  Jaclynn Major, [place], [facility], Pager [phone]

## 2019-10-03 NOTE — Progress Notes (Signed)
[facility] DAILY ICU PROGRESS NOTE                   [address]            [place], [address]          [phone]       Total Length of Stay:  LOS: 11 days   Subjective: Patient remains sedated on vent  Objective: Vital signs in last 24 hours: Temp:  [98.1 F (36.7 C)-100.9 F (38.3 C)] 100.2 F (37.9 C) (11/04 0702) Pulse Rate:  [39-119] 90 (11/04 0800) Cardiac Rhythm: Normal sinus rhythm (11/04 0400) Resp:  [13-38] 35 (11/04 0800) BP: (36-148)/(16-98) 122/79 (11/04 0715) SpO2:  [73 %-100 %] 100 % (11/04 0800) FiO2 (%):  [40 %-100 %] 100 % (11/04 0729) Weight:  [63.2 kg] 63.2 kg (11/04 0454)  Filed Weights   09/04/19 0500 09/05/19 0500 2019-09-27 0454  Weight: 63.9 kg 63.2 kg 63.2 kg    Weight change: 0 kg   Hemodynamic parameters for last 24 hours: CVP:  [2 mmHg-4 mmHg] 4 mmHg  Intake/Output from previous day: 11/03 0701 - 11/04 0700 In: 3471.8 [I.V.:2461.1; NG/GT:50; IV Piggyback:935.7] Out: 377 [Urine:56; Stool:1; Chest Tube:320]  Intake/Output this shift: Total I/O In: 30 [Blood:30] Out: -   Current Meds: Scheduled Meds: . acetaminophen (TYLENOL) oral liquid 160 mg/5 mL  650 mg Per Tube BID  . aspirin  81 mg Per Tube Daily  . atorvastatin  40 mg Oral q1800  . chlorhexidine gluconate (MEDLINE KIT)  15 mL Mouth Rinse BID  . Chlorhexidine Gluconate Cloth  6 each Topical Daily  . colchicine  0.6 mg Oral Daily  . famotidine  20 mg Per Tube Daily  . insulin aspart  0-9 Units Subcutaneous Q4H  . mouth rinse  15 mL Mouth Rinse 10 times per day  . predniSONE  60 mg Oral Q breakfast  . sodium chloride flush  10-40 mL Intracatheter Q12H   Continuous Infusions: . sodium chloride 20 mL/hr at September 27, 2019 0700  . dexmedetomidine (PRECEDEX) IV infusion 0.4 mcg/kg/hr (September 27, 2019 0800)  . feeding supplement (VITAL AF 1.2 CAL) 45 mL/hr at 09/03/19 0600  . fentaNYL infusion INTRAVENOUS 25 mcg/hr (09-27-19 0800)  . heparin Stopped (09/27/19 0630)  .  norepinephrine (LEVOPHED) Adult infusion 60 mcg/min (09-27-2019 0744)   PRN Meds:.acetaminophen, fentaNYL (SUBLIMAZE) injection, ipratropium-albuterol, ondansetron (ZOFRAN) IV, sodium chloride flush  Heart: SR Lungs: Diminshed bibasilar breath sounds Wound: Sternal wound is clean and dry  Lab Results: CBC: Recent Labs    09/05/19 0339 27-Sep-2019 0131 09/27/19 0508  WBC 27.0*  --  31.7*  HGB 7.7* 7.8* 6.1*  HCT 26.2* 23.0* 21.5*  PLT 523*  --  482*   BMET:  Recent Labs    09/05/19 0339 27-Sep-2019 0131 09/27/2019 0508  NA 139 136 140  K 4.0 4.7 5.3*  CL 104  --  106  CO2 23  --  14*  GLUCOSE 105*  --  136*  BUN 109*  --  125*  CREATININE 1.67*  --  2.81*  CALCIUM 9.4  --  8.1*    CMET: Lab Results  Component Value Date   WBC 31.7 (H) 27-Sep-2019   HGB 6.1 (LL) September 27, 2019   HCT 21.5 (L) Sep 27, 2019   PLT 482 (H) September 27, 2019   GLUCOSE 136 (H) 27-Sep-2019   CHOL 93 08/28/2019   TRIG 51 08/31/2019   HDL 30 (L) 08/28/2019   LDLCALC 42 08/28/2019   ALT 21  08/26/2019   AST 28 08/26/2019   NA 140 09/14/19   K 5.3 (H) 09/14/2019   CL 106 September 14, 2019   CREATININE 2.81 (H) 09/14/19   BUN 125 (H) September 14, 2019   CO2 14 (L) 2019-09-14   INR 1.1 08/26/2019   HGBA1C 5.5 08/26/2019      PT/INR: No results for input(s): LABPROT, INR in the last 72 hours. Radiology: Dg Chest Port 1 View  Result Date: 09-14-2019 CLINICAL DATA:  Pneumothorax EXAM: PORTABLE CHEST 1 VIEW COMPARISON:  September 05, 2019 FINDINGS: The right-sided PICC line is stable in positioning. The endotracheal tube terminates above the carina. There are bilateral chest tubes in place. There is no significant right-sided pneumothorax. No left-sided pneumothorax. Persistent bilateral pleural effusions and bibasilar airspace disease is again noted. The heart size remains enlarged. The patient is status post prior median sternotomy. Aortic calcifications are noted. IMPRESSION: 1. Lines and tubes as above. 2. No pneumothorax.  3. Persistent bibasilar airspace disease and small bilateral pleural effusions. Electronically Signed   By: Constance Holster M.D.   On: 09/14/19 06:10     Assessment/Plan: Patient re admitted for CHF and intubated for hypoxia. Pulmonary-s/p 28 French chest tube 10/30 and 14 French right chest tube by CCM 11/01;also, 20 French left chest tube 10/30. Unfortunately, she failed to wean from the ventilator yesterday.  Chest tube overall output with 320 cc last 24 hours. CXR this am shows small b/l pleural effusions. All 3 chest tubes are to suction and there is no air leak.   Chest tubes to remain to suction for now. Check CXR in am.   On Prednisone taper and Colchicine for post pericardotomy syndrome. Receiving PRBCs as H and H decreased to 6.1 and 21.5 this am Await pulmonary/CCM evaluation and discussion with palliative care today and family regarding goals of care and future management  Donielle Liston Alba  PA-C 14-Sep-2019 8:06 AM

## 2019-10-03 NOTE — Consult Note (Signed)
WOC consult requested for sacrum and bilat heels; pt has just expired. Julien Girt MSN, RN, [place], [place], [place]

## 2019-10-03 NOTE — Progress Notes (Addendum)
NAME:  Lacey Coleman, MRN:  [id number], DOB:  1931-09-08, LOS: 29 ADMISSION DATE:  09/03/2019, CONSULTATION DATE:  08/26/2019 REFERRING MD:  Dr. Leonides Schanz, CHIEF COMPLAINT:  Respiratory failure  Brief History   85 yo F PMH CAD s/p CABG in 08/2019, CHF, HTN, RA,A fib (eliquis dc 1 week ago)who presents 10/23 with AMS. Patient was found unresponsive and hypoxemic.  She did have a subacute infarct on MRI.  Hospital course has been complicated by bilateral pleural effusion with chest tube placement.  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  She has had intermittent hypotension requiring norepinephrine as well as atrial fibrillation with RVR.  There is also concern for acute amiodarone pulmonary toxicity.    Past Medical History  CHF, HTN, RA,A fib  Significant Hospital Events   Intubation on 10/23 Synchronized cardioversion on 11/2 PICC line placed on 10/31  Consults:  Heart failure Cardio thoracic surgery Neurology  Procedures:  10/24 endotracheal tube>> 09/01/2019 right chest tube x2 left chest tube x1>>  Significant Diagnostic Tests:  LVEF on 10/24 shows relatively normal LV ejection fraction Personally reviewed her CT chest from 10/29.  There are bilateral pleural effusions with compressive atelectasis in the lower lobes.  There are patchy upper lobe predominant groundglass opacities which could be consistent with pulmonary edema.  I see no fibrosis reticular markings.  Micro Data:  Blood cultures are negative Respiratory panel negative Pleural fluid cultures negative Antimicrobials:  09/18/2019 cefepime>>  Interim history/subjective:    Objective   Blood pressure 115/81, pulse 92, temperature 100.2 F (37.9 C), temperature source Bladder, resp. rate (!) 34, height 5\' 4"  (1.626 m), weight 63.2 kg, SpO2 100 %. CVP:  [2 mmHg-4 mmHg] 4 mmHg  Vent Mode: PRVC FiO2 (%):  [40 %-100 %] 100 % Set Rate:  [18  bmp] 18 bmp Vt Set:  [440 mL] 440 mL PEEP:  [8 cmH20] 8 cmH20 Plateau Pressure:  [22 cmH20-31 cmH20] 24 cmH20   Intake/Output Summary (Last 24 hours) at 09/23/2019 0831 Last data filed at 09/12/2019 0800 Gross per 24 hour  Intake 3522.6 ml  Output 407 ml  Net 3115.6 ml   Filed Weights   09/04/19 0500 09/05/19 0500 10/01/2019 0454  Weight: 63.9 kg 63.2 kg 63.2 kg    Examination: General: Frail cachectic female who appears to be end-stage HEENT: Tracheal tube is in place no JVD or lymphadenopathy is appreciated Neuro: When sedation decreased does arouse CV: Currently in sinus rhythm despite being maxed out on Levophed PULM: Coarse rhonchi bilaterally chest tube 2 right 1 left without obvious air leak GI: soft, bsx4 active  Extremities: warm/dry, 2+ edema  Skin: no rashes or lesions   Resolved Hospital Problem list   Possible cerebral infarct Atrial fibrillation with RVR Pneumothorax  Assessment & Plan:   Patient is an 85 year old woman with with history of recent CABG with complicated postoperative course.  She is being treated for:  Acute hypoxemic respiratory failure Multifactorial in the setting of somebody who is obviously end-stage and has little or no hope of improving. Plan for family meeting 09/05/2019 She would not survive  extubation therefore she would be a one-way extubation.    Post Pericardotomy Syndrome  Continue current treatment .   Atrial fibrillation with RVR Status post cardioversion 09/04/2019 currently sinus rhythm Heparin drip is been discontinued secondary to blood loss anemia Amiodarone is on hold Currently on metoprolol 25 mg twice daily     Bilateral  pleural effusions Chest tubes x3 with CVTS following Findings consistent with post pericardiotomy syndrome   Elevated white count History of MSSA 09-22-19 we will place on cefepime    Best practice:  Diet: NPO - NG to LWS for ileus, will resume TF if not excubating  Pain/Anxiety/Delirium protocol (if indicated): on fentanyl, use precedex second line. Scheduled tylenol VAP protocol (if indicated): Ordered DVT prophylaxis: heparin therapeutic GI prophylaxis: PPI Glucose control: BG<180 Foley: Continue Foley catheter.  Place external female catheter Mobility: OOB with assist.  We will consult PT Code Status: DNR-CCA - no chest compressions.  Family Communication: Family conference planned for 09-22-19 Disposition: needs ICU  Labs   CBC: Recent Labs  Lab 09/02/19 0356 09/03/19 0648 09/04/19 0500 09/05/19 0339 09-22-2019 0131 2019/09/22 0508  WBC 10.3 16.2* 16.0* 27.0*  --  31.7*  HGB 8.7* 9.5* 8.5* 7.7* 7.8* 6.1*  HCT 28.8* 32.1* 28.7* 26.2* 23.0* 21.5*  MCV 70.4* 72.3* 72.7* 73.2*  --  75.7*  PLT 414* 473* 412* 523*  --  482*    Basic Metabolic Panel: Recent Labs  Lab 09/02/19 0356 09/03/19 0648 09/04/19 0500 09/05/19 0339 September 22, 2019 0131 2019-09-22 0508  NA 136 136 138 139 136 140  K 3.5 3.6 3.4* 4.0 4.7 5.3*  CL 98 99 100 104  --  106  CO2 27 24 27 23   --  14*  GLUCOSE 120* 128* 110* 105*  --  136*  BUN 80* 91* 87* 109*  --  125*  CREATININE 1.12* 1.18* 1.11* 1.67*  --  2.81*  CALCIUM 9.4 9.8 9.6 9.4  --  8.1*   GFR: Estimated Creatinine Clearance: 12.4 mL/min (A) (by C-G formula based on SCr of 2.81 mg/dL (H)). Recent Labs  Lab 09/03/19 0648 09/04/19 0500 09/05/19 0339 09/22/2019 0508  WBC 16.2* 16.0* 27.0* 31.7*    Liver Function Tests: No results for input(s): AST, ALT, ALKPHOS, BILITOT, PROT, ALBUMIN in the last 168 hours. No results for input(s): LIPASE, AMYLASE in the last 168 hours. No results for input(s): AMMONIA in the last 168 hours.  ABG    Component Value Date/Time   PHART 7.325 (L) 09/22/19 0131   PCO2ART 32.8 09-22-2019 0131   PO2ART 286.0 (H) 2019/09/22 0131   HCO3 17.0 (L) 09/22/19 0131   TCO2 18 (L) 22-Sep-2019 0131   ACIDBASEDEF 8.0 (H) 22-Sep-2019 0131   O2SAT 100.0 2019/09/22 0131     HbA1C:  Hgb A1c MFr Bld  Date/Time Value Ref Range Status  08/26/2019 04:41 AM 5.5 4.8 - 5.6 % Final    Comment:    (NOTE) Pre diabetes:          5.7%-6.4% Diabetes:              >6.4% Glycemic control for   <7.0% adults with diabetes   07/16/2019 03:48 AM 5.5 4.8 - 5.6 % Final    Comment:    (NOTE)         Prediabetes: 5.7 - 6.4         Diabetes: >6.4         Glycemic control for adults with diabetes: <7.0     CBG: Recent Labs  Lab Sep 22, 2019 0442 Sep 22, 2019 0505 Sep 22, 2019 0737 09/22/19 0807 22-Sep-2019 0810  GLUCAP 58* 128* 28* 44* 134*    Critical care time: 30   Steve Minor ACNP [facility]ack PCCM Pager [phone] till 1 pm If no answer page [phone] 2019-09-22, 8:31 AM  Attending Note:  85 year old female  with PMH of CAD s/p CABG presenting with respiratory failure, circulatory shock, renal failure and acute encephalopathy.  Overnight, worsening hypotension on levophed 60 mcg and completely unresponsive.  On exam, coarse BS diffusely with a benign abdomen.  I reviewed CXR myself, ETT is in a good position and no acute disease noted.  Discussed with PCCM-NP.  Will d/c sedation today.  Attempt extubation today.  CIWA protocol ordered.  Precedex as ordered.  PCCM will continue to follow.  The patient is critically ill with multiple organ systems failure and requires high complexity decision making for assessment and support, frequent evaluation and titration of therapies, application of advanced monitoring technologies and extensive interpretation of multiple databases.   Critical Care Time devoted to patient care services described in this note is  33  Minutes. This time reflects time of care of this signee Dr Koren Bound. This critical care time does not reflect procedure time, or teaching time or supervisory time of PA/NP/Med student/Med Resident etc but could involve care discussion time.  Alyson Reedy, M.D. [facility] Pulmonary/Critical Care Medicine.

## 2019-10-03 NOTE — Progress Notes (Signed)
[place] Progress Note Patient Name: Lacey Coleman DOB: 23-Jan-1931 MRN: [id number]   Date of Service  09/25/2019  HPI/Events of Note  Hypotension - BP = 86/42 with MAP = 58. Norepinephrine IV infusion at ceiling. CVP = 2.   eICU Interventions  Will order: 1. Bolus with 0.9 NaCl 1 liter IV over 1 hour now. 2. Increase ceiling dose on Norepinephrine IV infusion to 60 mcg/min.     Intervention Category Major Interventions: Hypotension - evaluation and management  Sommer,Steven Eugene 09/30/2019, 4:38 AM

## 2019-10-03 NOTE — Progress Notes (Signed)
Pt expired.  RN pronounced patient and ventilator was discontinued.

## 2019-10-03 NOTE — Progress Notes (Signed)
Patient maxed out on levo @60 , all sedation turned off, SBP intermittently in 60s w/ MAP 30s-50s.  SpO2 periodically dropping into 70s-80s. CCM MD aware.  Per CCM MD, do not escalate care at this time, do not titrate up on pressors, leave vent settings at FiO2: 70, Peep: 12.  Family has been updated on pts status and is on their way.

## 2019-10-03 NOTE — Progress Notes (Addendum)
Time of Death 1005. Verified by this RN and [name], NP Palliative Care.  CCM MD notified.  Family at bedside at time of death.

## 2019-10-03 DEATH — deceased

## 2019-10-09 ENCOUNTER — Encounter (HOSPITAL_COMMUNITY): Payer: Medicare Other | Admitting: Internal Medicine

## 2019-10-17 ENCOUNTER — Other Ambulatory Visit: Payer: Self-pay | Admitting: Surgical

## 2019-11-16 ENCOUNTER — Other Ambulatory Visit: Payer: Self-pay | Admitting: Surgical

## 2019-12-22 IMAGING — MR MR HEAD W/O CM
10 of 11 series · 42 of 48 positions shown · non-contrast
Comparison: Prior CT from earlier the same day.

CLINICAL DATA: Initial evaluation for acute unresponsiveness.

EXAM:
MRI HEAD WITHOUT CONTRAST
TECHNIQUE: Multiplanar, multiecho pulse sequences of the brain and surrounding
structures were obtained without intravenous contrast.

[Series 5: DWI · axial · 3.0mm · 0.88mm/px · z∈[-84,+47]mm · 9 of 92 slices shown (1 of 4)]
[im 1/92]
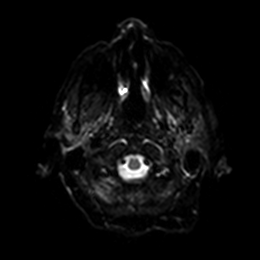
[im 12/92]
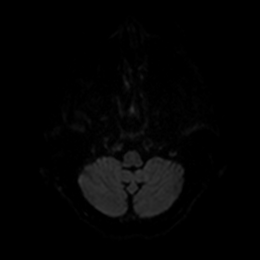
[im 23/92]
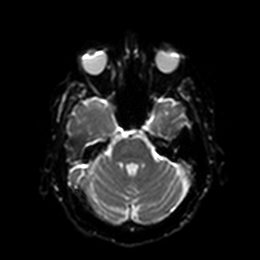
[im 35/92]
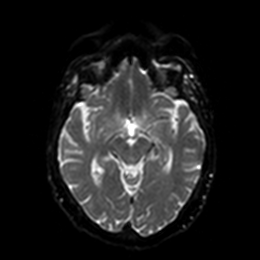
[im 46/92]
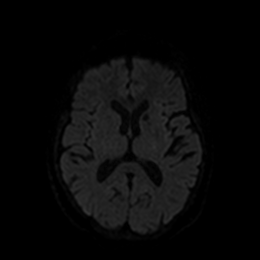
[im 57/92]
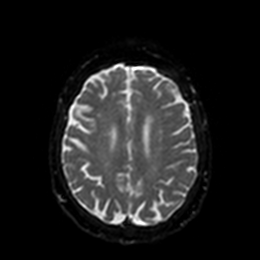
[im 69/92]
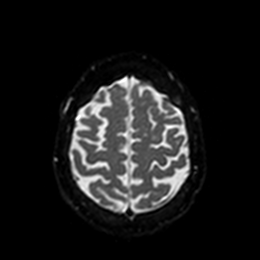
[im 80/92]
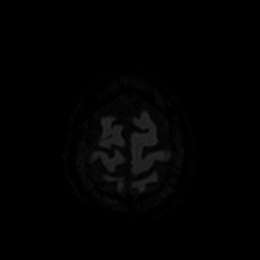
[im 92/92]
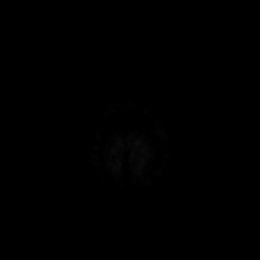

[Series 6: DWI · axial · 3.0mm · 0.88mm/px · z∈[-84,+47]mm · 4 of 46 slices shown (2 of 4)]
[im 1/46]
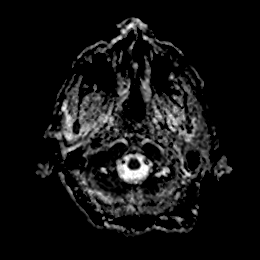
[im 16/46]
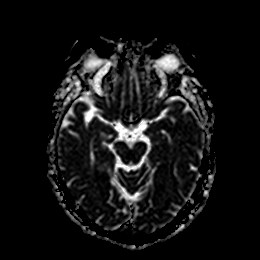
[im 31/46]
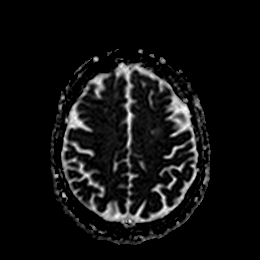
[im 46/46]
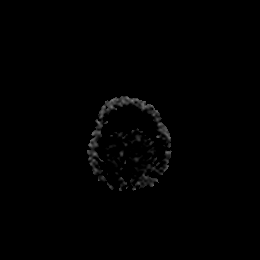

[Series 7: DWI · coronal · 4.0mm · 0.88mm/px · 6 of 68 slices shown (3 of 4)]
[im 1/68]
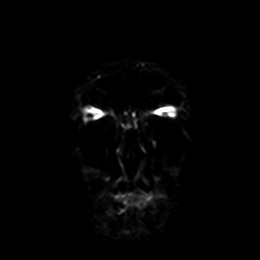
[im 14/68]
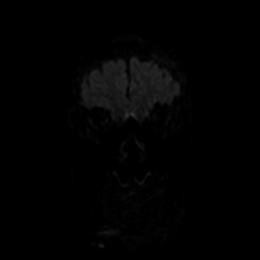
[im 27/68]
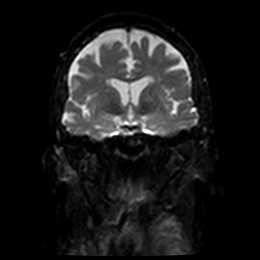
[im 41/68]
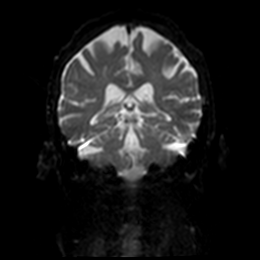
[im 54/68]
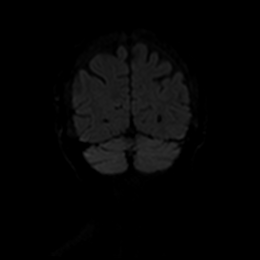
[im 68/68]
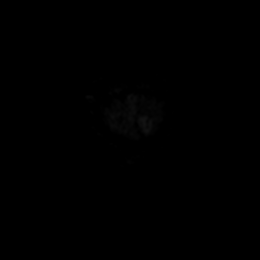

[Series 8: DWI · coronal · 4.0mm · 0.88mm/px · 3 of 33 slices shown (4 of 4)]
[im 1/33]
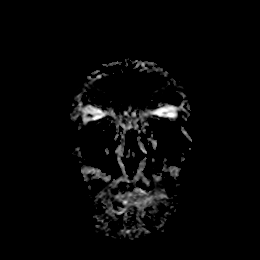
[im 17/33]
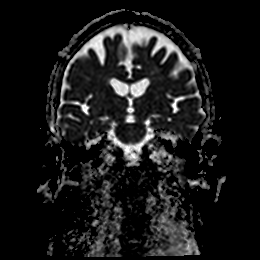
[im 33/33]
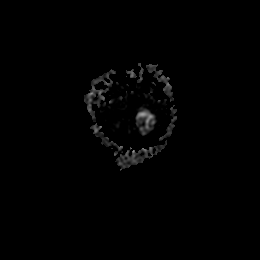

[Series 9: T1 · sagittal · 5.0mm · 0.75mm/px · 2 of 25 slices shown]
[im 1/25]
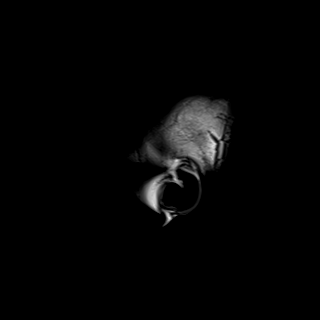
[im 25/25]
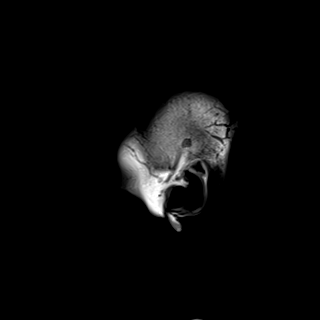

[Series 10: T2 · axial · 5.0mm · 0.72mm/px · z∈[-85,+57]mm · 2 of 25 slices shown (1 of 2)]
[im 1/25]
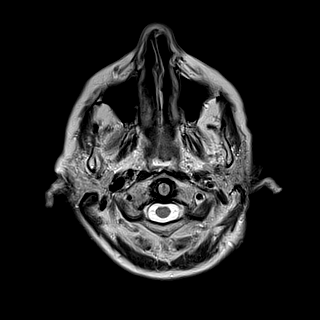
[im 25/25]
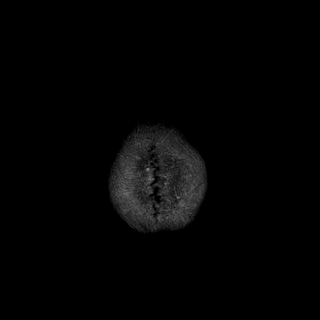

[Series 11: FLAIR · axial · 5.0mm · 0.45mm/px · z∈[-86,+55]mm · 2 of 25 slices shown]
[im 1/25]
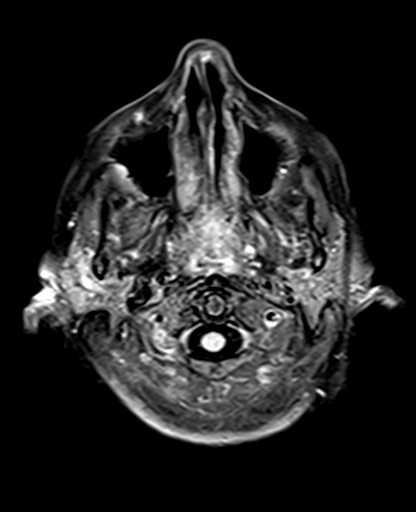
[im 25/25]
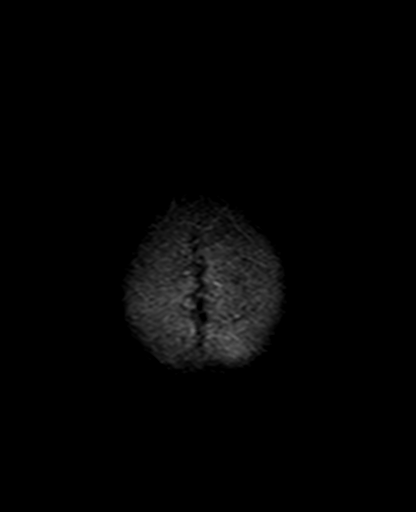

[Series 13: pha_images · axial · 3.0mm · 0.90mm/px · z∈[-111,+47]mm · 5 of 56 slices shown]
[im 1/56]
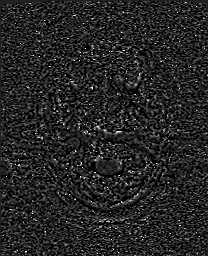
[im 14/56]
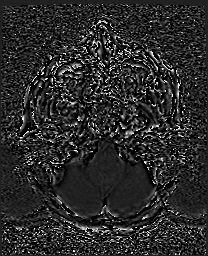
[im 28/56]
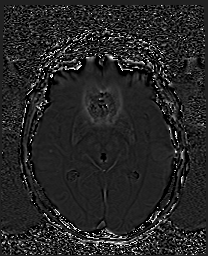
[im 42/56]
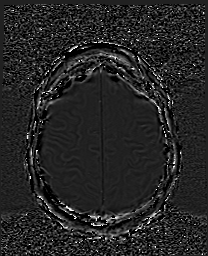
[im 56/56]
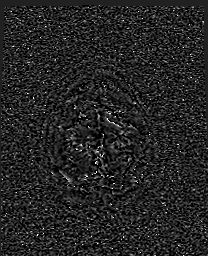

[Series 14: swi_images · axial · 3.0mm · 0.90mm/px · z∈[-111,+59]mm · 6 of 60 slices shown]
[im 1/60]
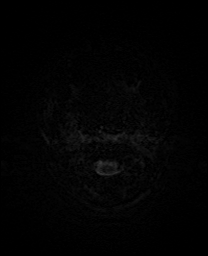
[im 12/60]
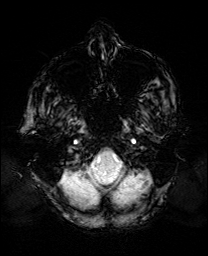
[im 24/60]
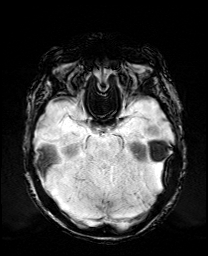
[im 36/60]
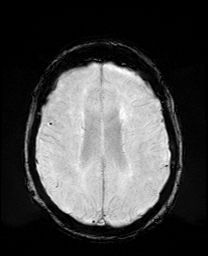
[im 48/60]
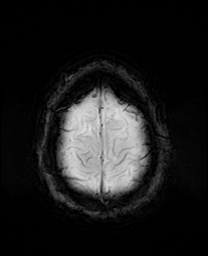
[im 60/60]
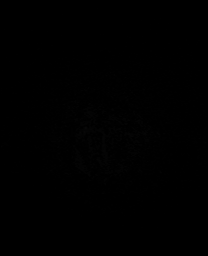

[Series 17: T2 · coronal · 5.0mm · 0.34mm/px · 3 of 29 slices shown (2 of 2)]
[im 1/29]
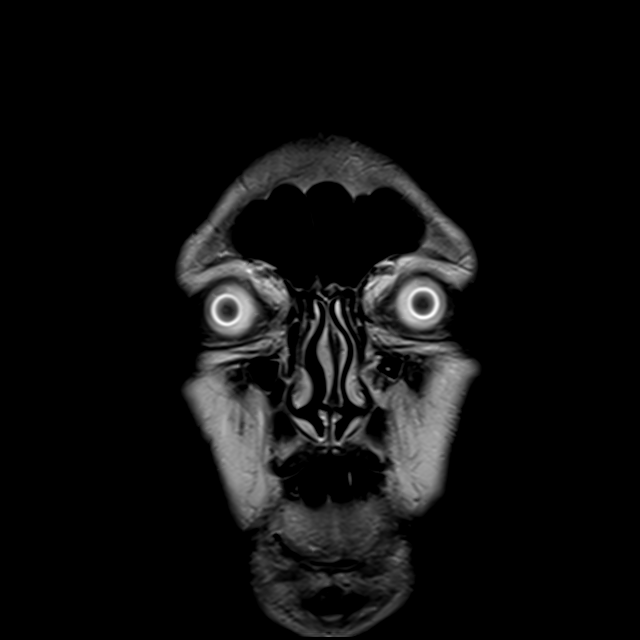
[im 15/29]
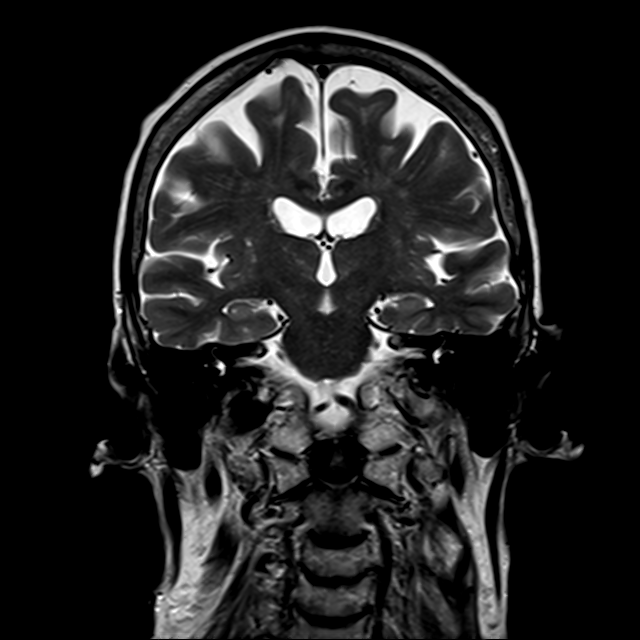
[im 29/29]
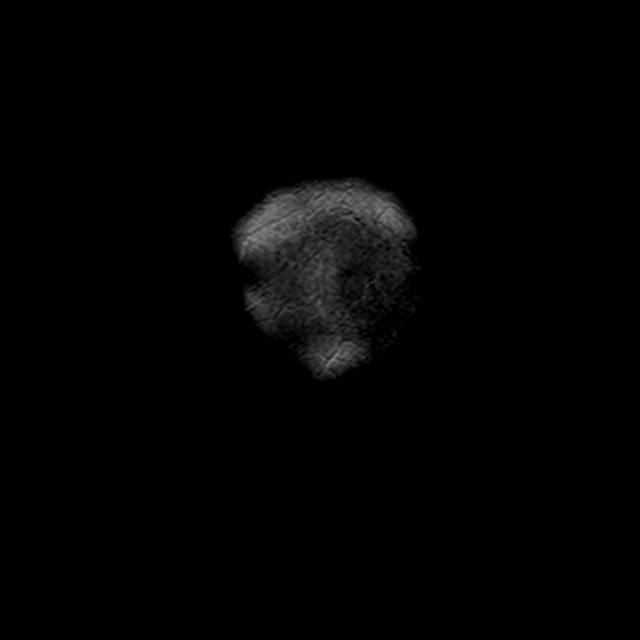

[42 of 48 positions shown; findings below may reference images not displayed]

FINDINGS: Brain: Generalized age-related cerebral atrophy. Mild patchy
T2/FLAIR hyperintensity within the periventricular deep white matter
both cerebral hemispheres most consistent with chronic small vessel
ischemic disease, mild in nature. Few scatter remote lacunar infarct
present within the bilateral basal ganglia.

18 mm focus of diffusion abnormality involving the subcortical left
frontal lobe most consistent with evolving late subacute ischemic
infarct (series 5, image 78). No associated hemorrhage or mass
effect. No other evidence for acute or subacute ischemia. Gray-white
matter differentiation otherwise maintained with no other chronic or
remote cortical infarction. No acute intracranial hemorrhage. Few
scattered chronic micro hemorrhages noted.

No mass lesion, midline shift or mass effect. No hydrocephalus. No
extra-axial fluid collection. Pituitary gland within normal limits
for suprasellar region normal. Midline structures intact.

Vascular: Major intracranial vascular flow voids are maintained.

Skull and upper cervical spine: Craniocervical junction within
normal limits. Multilevel degenerative spondylosis noted within the
upper cervical spine with resultant mild diffuse spinal stenosis.
Bone marrow signal intensity within normal limits. No scalp soft
tissue abnormality.

Sinuses/Orbits: Globes orbital soft tissues within normal limits.
Paranasal sinuses are clear. No mastoid effusion. Inner ear
structures grossly normal.

Other: None.
IMPRESSION: 1. 18 mm focus of diffusion abnormality involving the subcortical
left frontal lobe, most consistent with evolving late subacute
ischemic infarct. No associated hemorrhage or mass effect.
2. No other acute intracranial abnormality.
3. Underlying atrophy with mild chronic microvascular ischemic
disease.
